# Patient Record
Sex: Female | Born: 1975 | Race: Black or African American | Hispanic: No | Marital: Married | Smoking: Never smoker
Health system: Southern US, Community
[De-identification: ages and names within clinical notes are randomized; demographics above are authoritative.]

## PROBLEM LIST (undated history)

## (undated) ENCOUNTER — Emergency Department (HOSPITAL_BASED_OUTPATIENT_CLINIC_OR_DEPARTMENT_OTHER): Admission: EM | Discharge status: Absent without leave | Payer: Self-pay

## (undated) DIAGNOSIS — T7840XA Allergy, unspecified, initial encounter: Secondary | ICD-10-CM

## (undated) DIAGNOSIS — R87629 Unspecified abnormal cytological findings in specimens from vagina: Secondary | ICD-10-CM

## (undated) DIAGNOSIS — E785 Hyperlipidemia, unspecified: Secondary | ICD-10-CM

## (undated) DIAGNOSIS — I10 Essential (primary) hypertension: Secondary | ICD-10-CM

## (undated) HISTORY — DX: Hyperlipidemia, unspecified: E78.5

## (undated) HISTORY — PX: TUBAL LIGATION: SHX77

## (undated) HISTORY — DX: Allergy, unspecified, initial encounter: T78.40XA

## (undated) HISTORY — DX: Essential (primary) hypertension: I10

## (undated) HISTORY — PX: ABDOMINAL HYSTERECTOMY: SHX81

## (undated) HISTORY — DX: Unspecified abnormal cytological findings in specimens from vagina: R87.629

---

## 2010-06-12 ENCOUNTER — Emergency Department (HOSPITAL_BASED_OUTPATIENT_CLINIC_OR_DEPARTMENT_OTHER): Admission: EM | Admit: 2010-06-12 | Discharge: 2010-06-12 | Payer: Self-pay | Admitting: Emergency Medicine

## 2011-02-05 LAB — URINALYSIS, ROUTINE W REFLEX MICROSCOPIC
Bilirubin Urine: NEGATIVE
Ketones, ur: NEGATIVE mg/dL
Nitrite: NEGATIVE
Protein, ur: NEGATIVE mg/dL
Urobilinogen, UA: 0.2 mg/dL (ref 0.0–1.0)
pH: 5.5 (ref 5.0–8.0)

## 2011-02-05 LAB — PREGNANCY, URINE: Preg Test, Ur: NEGATIVE

## 2015-05-14 IMAGING — US US ABDOMEN LIMITED
1 series · 14 of 25 positions shown · non-contrast
Comparison: None.

CLINICAL DATA: Right upper quadrant pain radiating to the back for
2 days. Nausea.

EXAM:
US ABDOMEN LIMITED - RIGHT UPPER QUADRANT

[Series 1: us abdomen limited · 0.20mm/px · 14 of 44 slices shown]
[im 1/44]
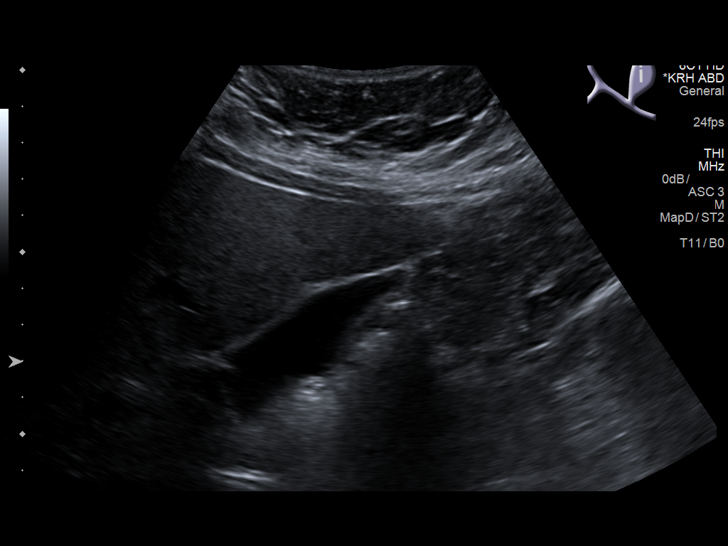
[im 4/44]
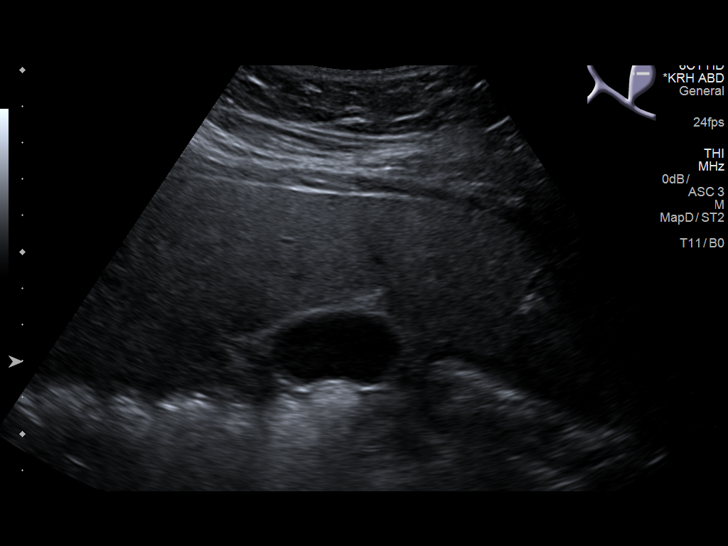
[im 8/44]
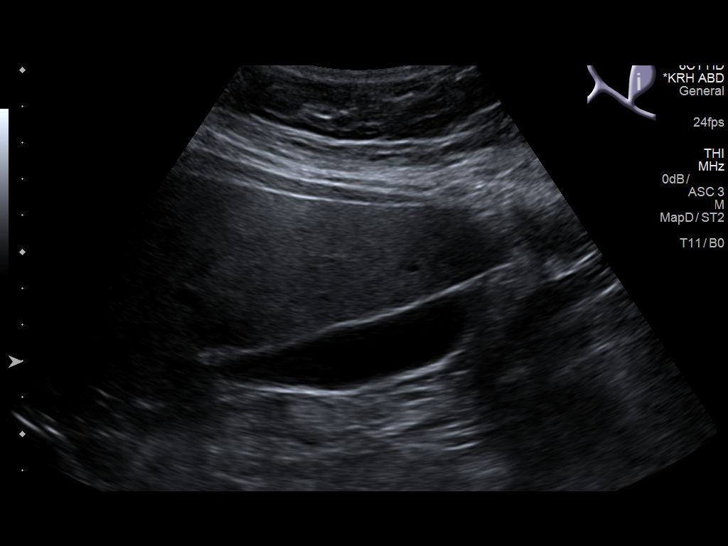
[im 11/44]
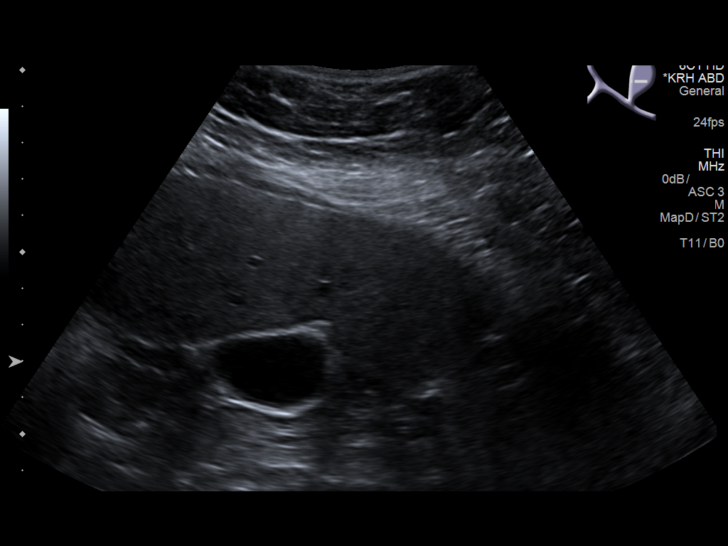
[im 15/44]
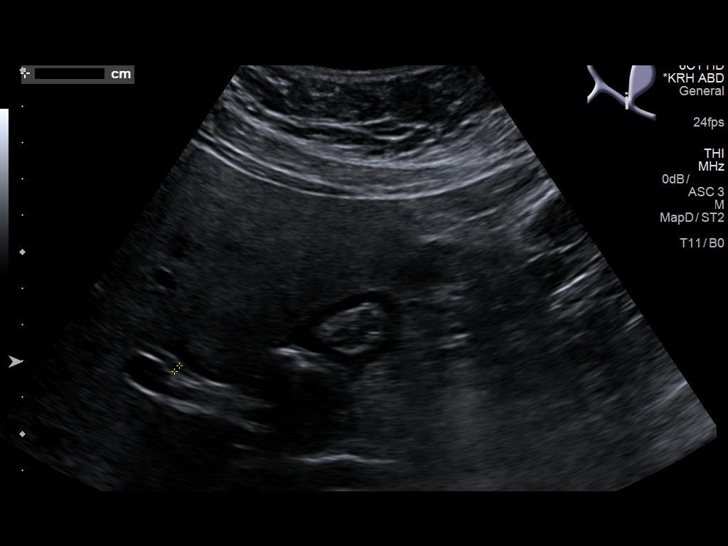
[im 17/44]
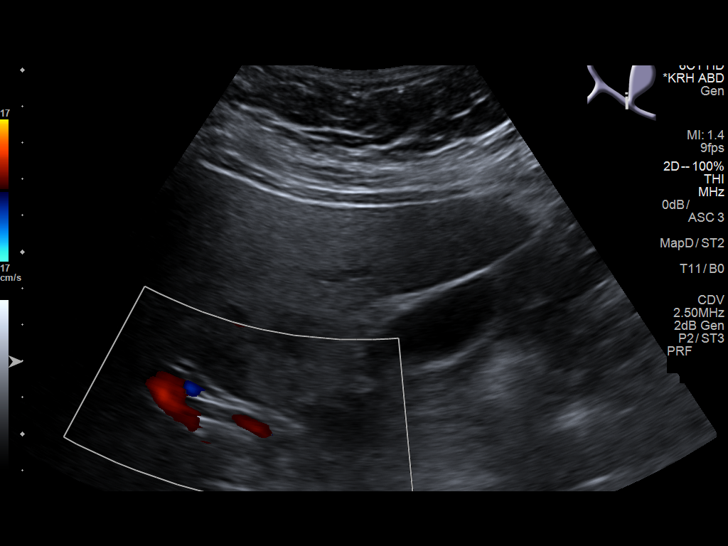
[im 20/44]
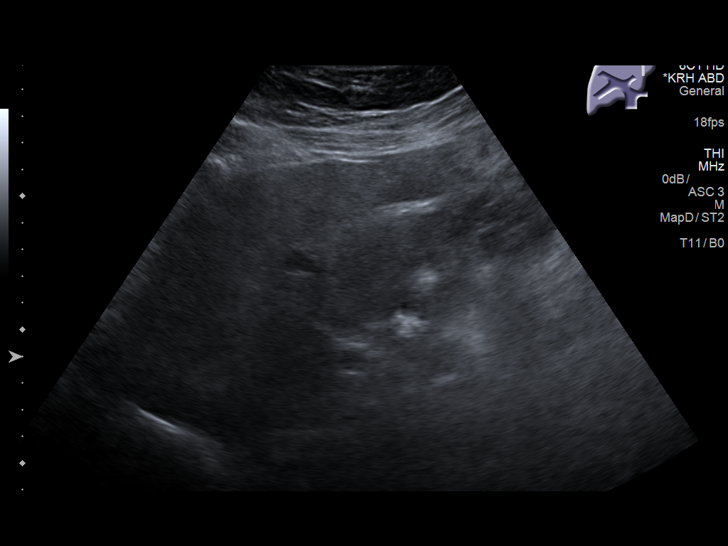
[im 24/44]
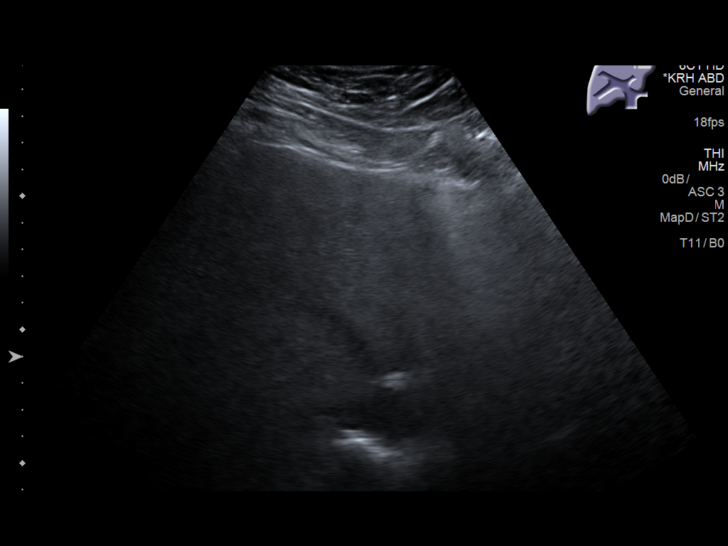
[im 27/44]
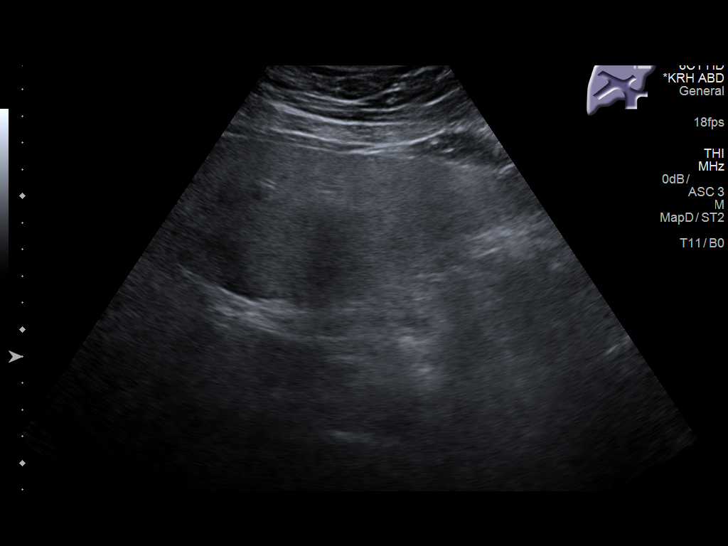
[im 29/44]
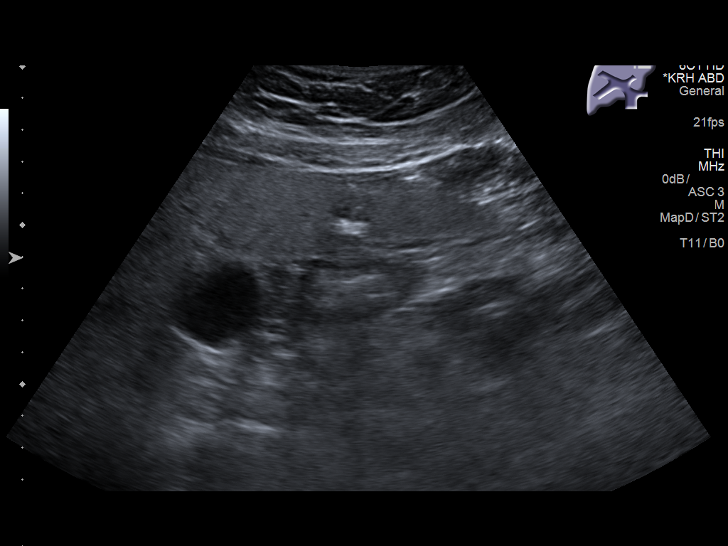
[im 33/44]
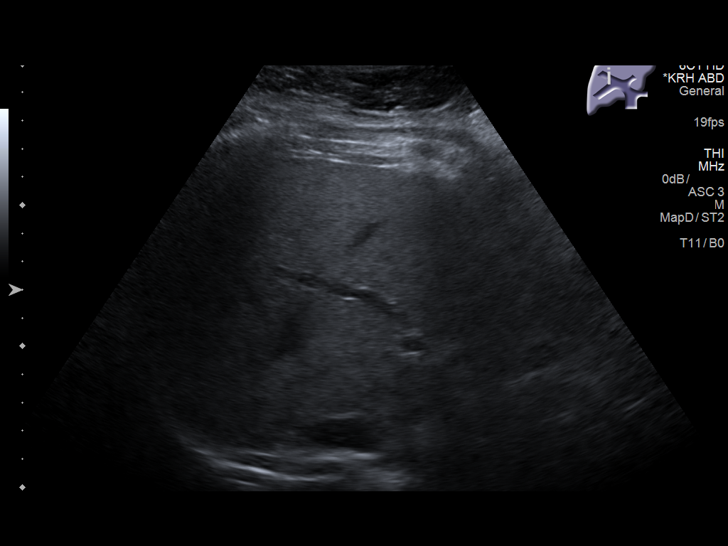
[im 36/44]
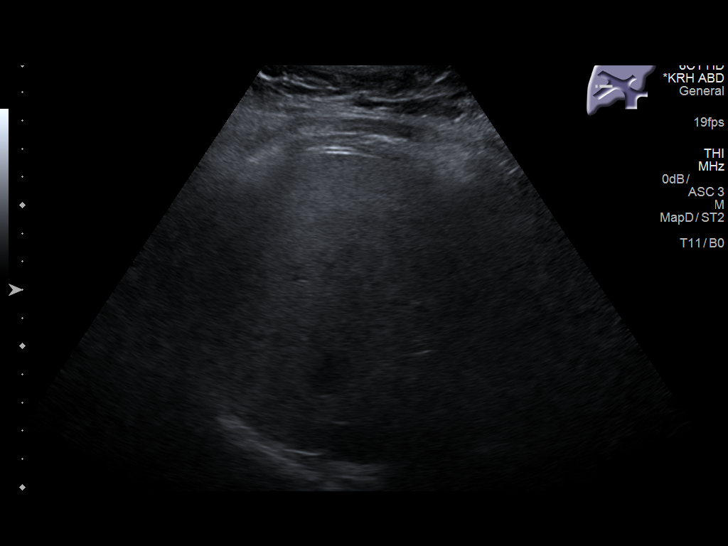
[im 40/44]
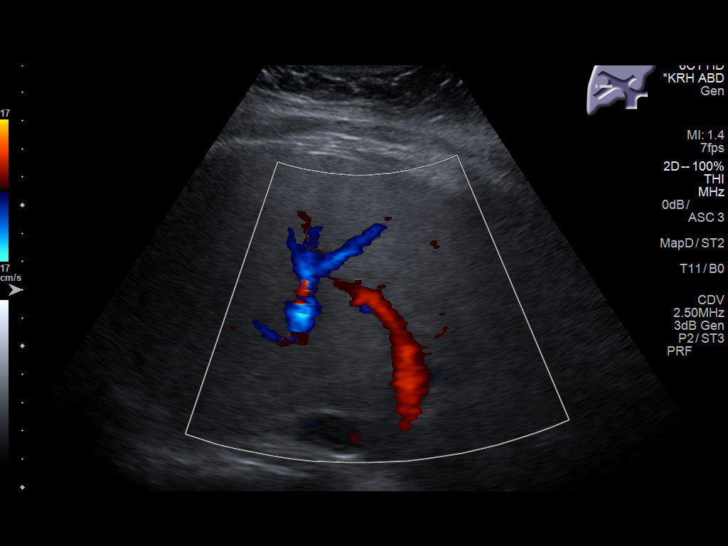
[im 44/44]
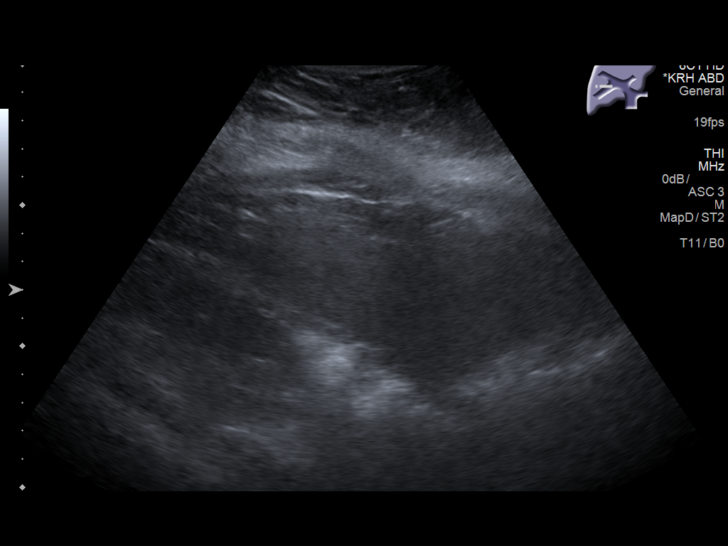

[14 of 25 positions shown; findings below may reference images not displayed]

FINDINGS: Gallbladder:

No gallstones or wall thickening visualized. No sonographic Murphy
sign noted by sonographer.

Common bile duct:

Diameter: 2.4 mm

Liver:

Diffuse echogenicity of hepatic parenchyma, likely fatty
infiltration. No focal liver lesion.
IMPRESSION: Normal gallbladder and bile ducts. Echogenic hepatic parenchyma may
represent fatty infiltration.

## 2015-10-29 ENCOUNTER — Encounter (INDEPENDENT_AMBULATORY_CARE_PROVIDER_SITE_OTHER): Payer: Self-pay

## 2015-10-29 ENCOUNTER — Encounter: Payer: Self-pay | Admitting: Medical

## 2015-10-29 ENCOUNTER — Ambulatory Visit (INDEPENDENT_AMBULATORY_CARE_PROVIDER_SITE_OTHER): Payer: BLUE CROSS/BLUE SHIELD | Admitting: Medical

## 2015-10-29 ENCOUNTER — Ambulatory Visit (HOSPITAL_BASED_OUTPATIENT_CLINIC_OR_DEPARTMENT_OTHER)
Admission: RE | Admit: 2015-10-29 | Discharge: 2015-10-29 | Disposition: A | Discharge status: Home / Self Care | Payer: BLUE CROSS/BLUE SHIELD | Source: Ambulatory Visit | Attending: Medical | Admitting: Medical

## 2015-10-29 VITALS — BP 120/80 | HR 88 | Temp 98.0°F | Ht 62.0 in | Wt 233.0 lb

## 2015-10-29 DIAGNOSIS — M545 Low back pain: Secondary | ICD-10-CM

## 2015-10-29 DIAGNOSIS — M25512 Pain in left shoulder: Secondary | ICD-10-CM

## 2015-10-29 DIAGNOSIS — M25812 Other specified joint disorders, left shoulder: Secondary | ICD-10-CM | POA: Insufficient documentation

## 2015-10-29 DIAGNOSIS — R8290 Unspecified abnormal findings in urine: Secondary | ICD-10-CM

## 2015-10-29 DIAGNOSIS — I10 Essential (primary) hypertension: Secondary | ICD-10-CM

## 2015-10-29 DIAGNOSIS — M25519 Pain in unspecified shoulder: Secondary | ICD-10-CM | POA: Insufficient documentation

## 2015-10-29 DIAGNOSIS — G8929 Other chronic pain: Secondary | ICD-10-CM | POA: Insufficient documentation

## 2015-10-29 DIAGNOSIS — M542 Cervicalgia: Secondary | ICD-10-CM | POA: Diagnosis not present

## 2015-10-29 DIAGNOSIS — J309 Allergic rhinitis, unspecified: Secondary | ICD-10-CM | POA: Insufficient documentation

## 2015-10-29 DIAGNOSIS — J3089 Other allergic rhinitis: Secondary | ICD-10-CM | POA: Diagnosis not present

## 2015-10-29 HISTORY — DX: Essential (primary) hypertension: I10

## 2015-10-29 LAB — CBC WITH DIFFERENTIAL/PLATELET
BASOS ABS: 0 10*3/uL (ref 0.0–0.1)
Basophils Relative: 0.6 % (ref 0.0–3.0)
EOS PCT: 6.4 % — AB (ref 0.0–5.0)
Eosinophils Absolute: 0.4 10*3/uL (ref 0.0–0.7)
HEMATOCRIT: 40.8 % (ref 36.0–46.0)
Hemoglobin: 13.8 g/dL (ref 12.0–15.0)
LYMPHS PCT: 45.9 % (ref 12.0–46.0)
Lymphs Abs: 2.9 10*3/uL (ref 0.7–4.0)
MCHC: 33.8 g/dL (ref 30.0–36.0)
MCV: 89.6 fl (ref 78.0–100.0)
MONOS PCT: 7.2 % (ref 3.0–12.0)
Monocytes Absolute: 0.5 10*3/uL (ref 0.1–1.0)
Neutro Abs: 2.6 10*3/uL (ref 1.4–7.7)
Neutrophils Relative %: 39.9 % — ABNORMAL LOW (ref 43.0–77.0)
PLATELETS: 291 10*3/uL (ref 150.0–400.0)
RBC: 4.56 Mil/uL (ref 3.87–5.11)
RDW: 13.3 % (ref 11.5–15.5)
WBC: 6.4 10*3/uL (ref 4.0–10.5)

## 2015-10-29 LAB — POCT URINALYSIS DIPSTICK
Bilirubin, UA: NEGATIVE
Glucose, UA: NEGATIVE
Ketones, UA: NEGATIVE
NITRITE UA: 2
PH UA: 6
RBC UA: NEGATIVE
Spec Grav, UA: >=1.03
UROBILINOGEN UA: 0.2

## 2015-10-29 LAB — COMPREHENSIVE METABOLIC PANEL
ALK PHOS: 87 U/L (ref 39–117)
ALT: 36 U/L — AB (ref 0–35)
AST: 22 U/L (ref 0–37)
Albumin: 4.3 g/dL (ref 3.5–5.2)
BILIRUBIN TOTAL: 0.3 mg/dL (ref 0.2–1.2)
BUN: 11 mg/dL (ref 6–23)
CALCIUM: 9.9 mg/dL (ref 8.4–10.5)
CO2: 31 meq/L (ref 19–32)
Chloride: 101 mEq/L (ref 96–112)
Creatinine, Ser: 0.65 mg/dL (ref 0.40–1.20)
GFR: 128.64 mL/min (ref >60.00)
Glucose, Bld: 86 mg/dL (ref 70–99)
Potassium: 3.6 mEq/L (ref 3.5–5.1)
Sodium: 140 mEq/L (ref 135–145)
TOTAL PROTEIN: 7.9 g/dL (ref 6.0–8.3)

## 2015-10-29 IMAGING — DX DG SHOULDER 2+V*L*
3 series · 3 of 3 positions shown · non-contrast
Comparison: Chest x-ray of [DATE] which included portions
of the left shoulder.

CLINICAL DATA: Chronic left shoulder pain with no known injury

EXAM:
LEFT SHOULDER - 2+ VIEW

[shoulder grashey]
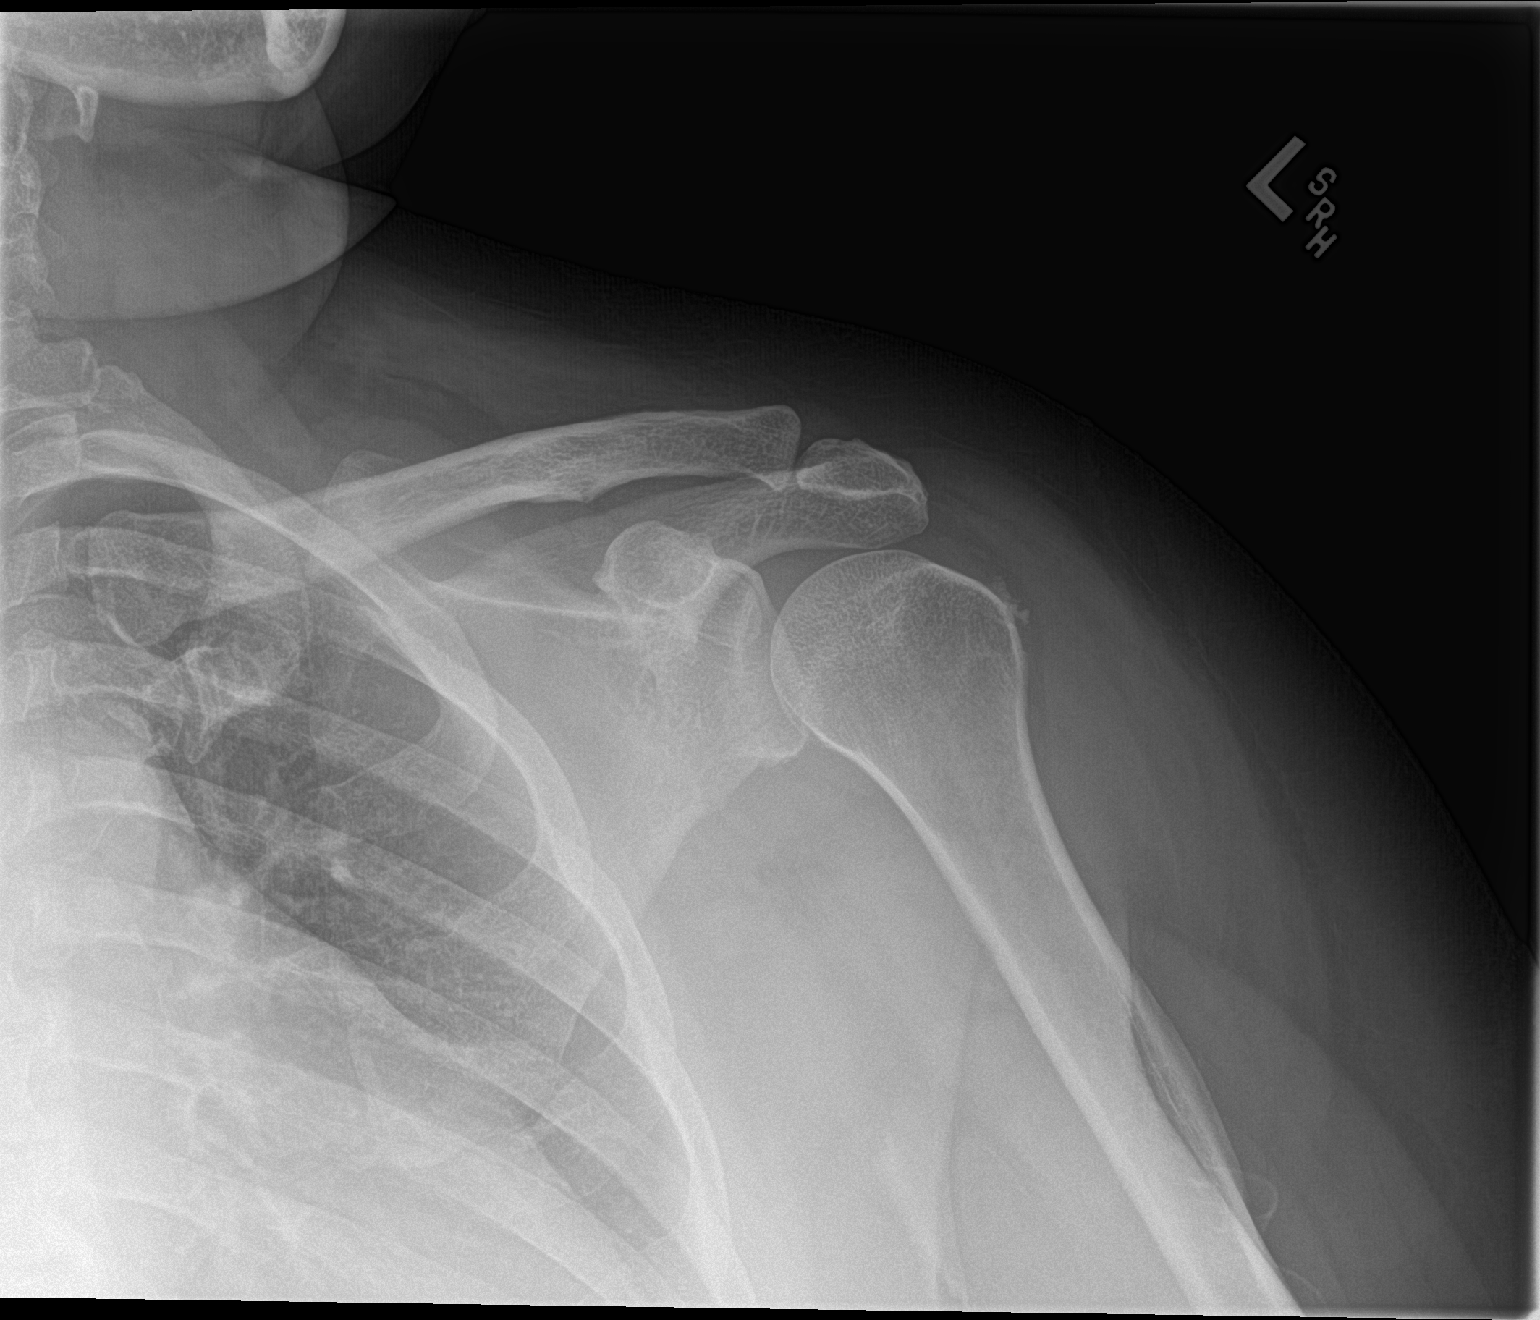

[shoulder y view]
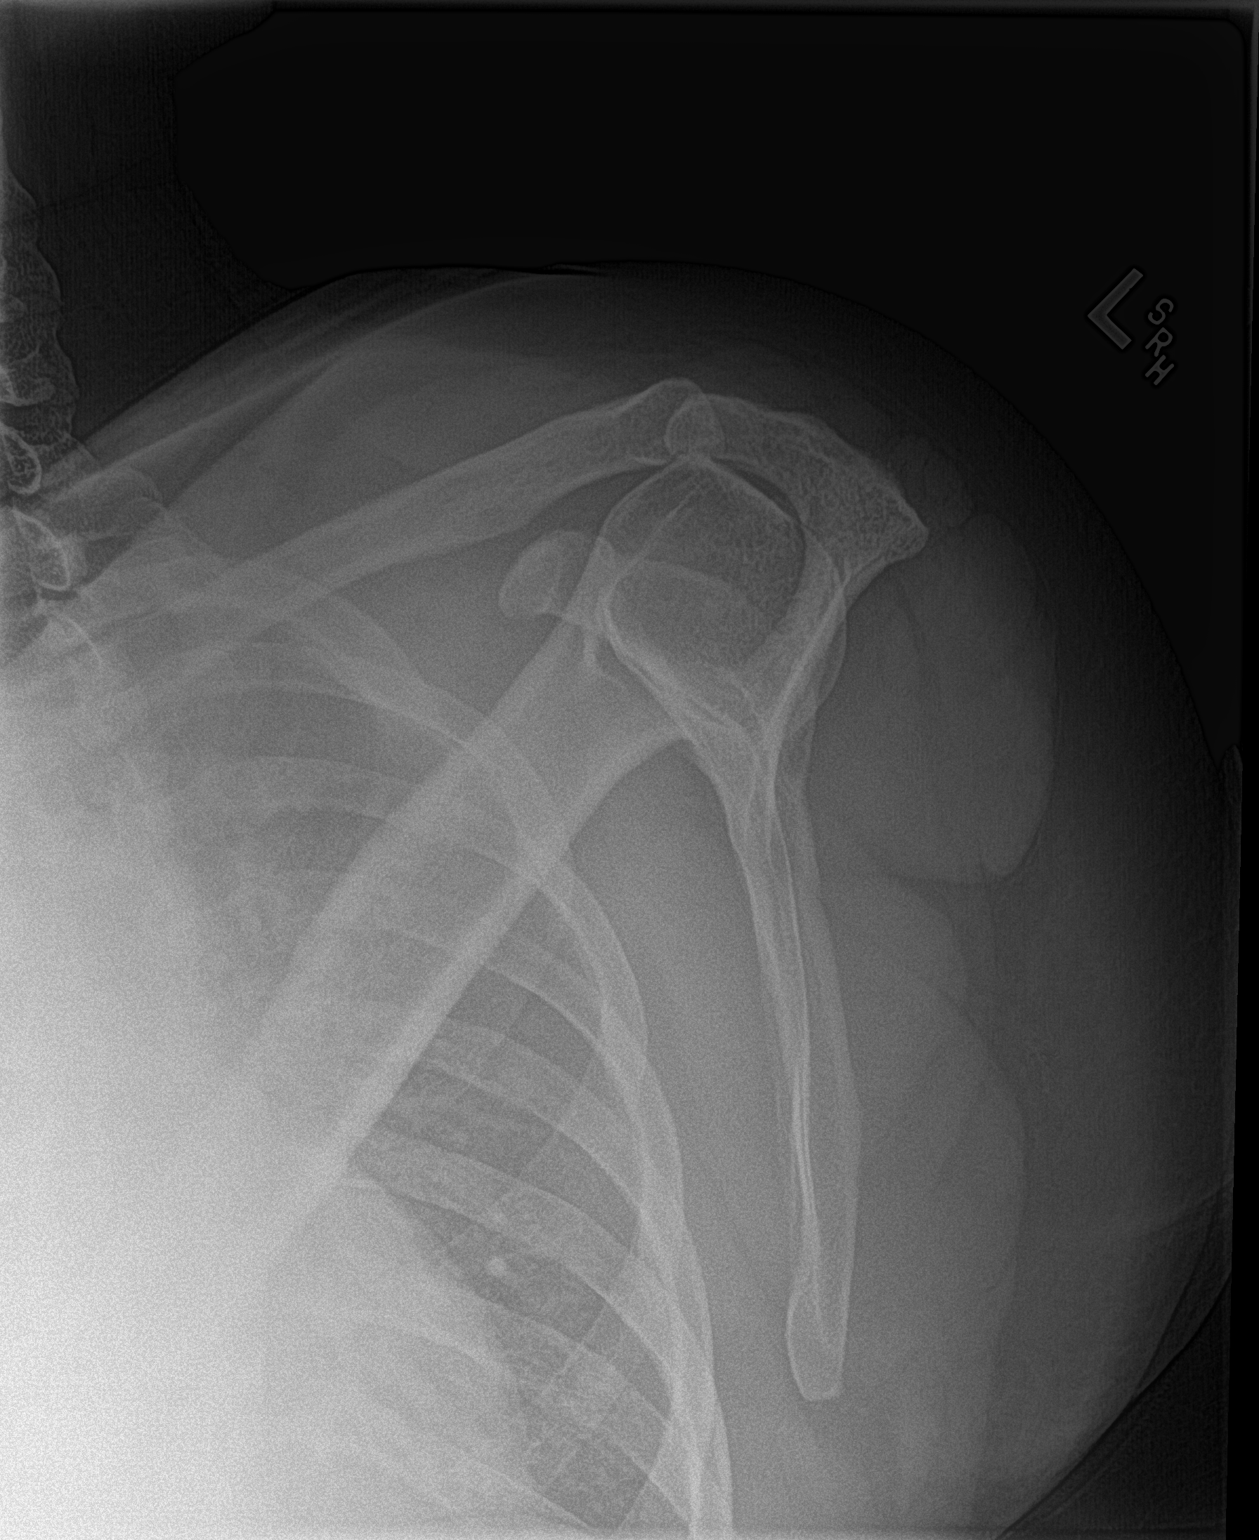

[shoulder axillary]
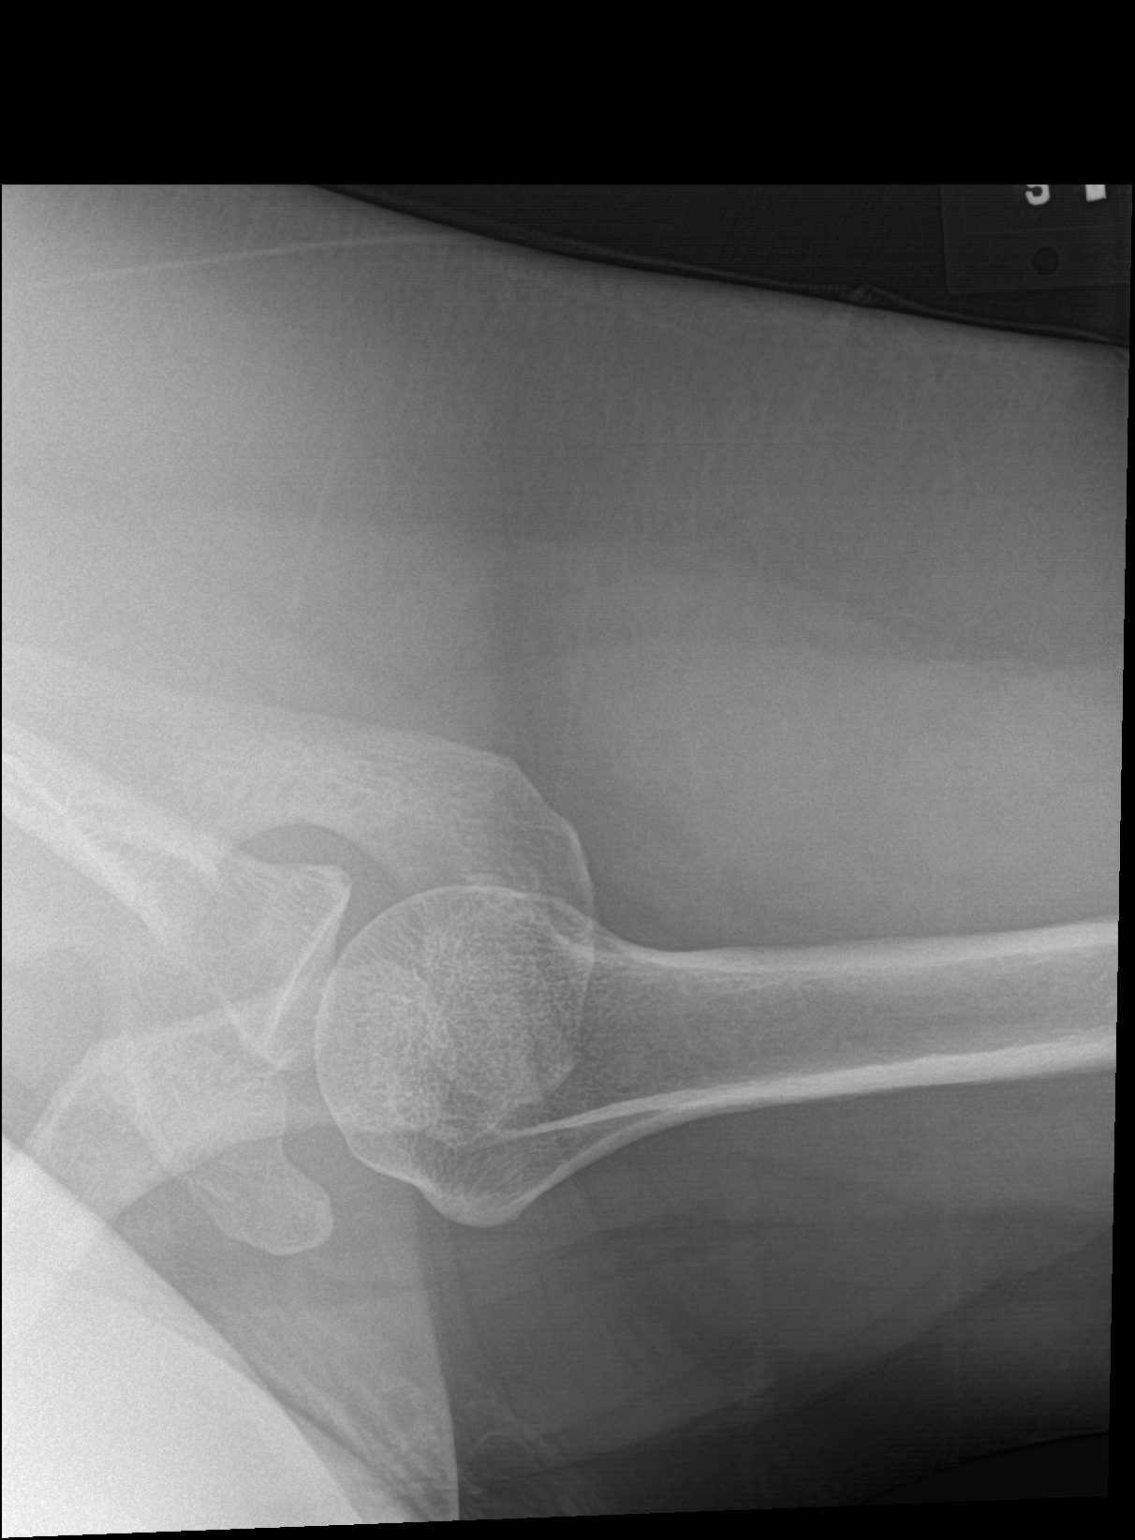

[3 of 3 positions shown; findings below may reference images not displayed]

FINDINGS: The bones are adequately mineralized. There is a small amount of
calcification in the region of the insertion of the rotator cuff on
the greater tuberosity of the humerus. The glenohumeral and AC joint
spaces are preserved. The subacromial subdeltoid space appears
normal. The observed portions of the left clavicle and upper left
ribs are normal.
IMPRESSION: Calcification in the region of the insertion of the rotator cuff on
the greater tuberosity of the humerus. Otherwise no significant
abnormality of the shoulder. If rotator cuff or glenoid labrum
abnormality is suspected, MRI would be a useful next imaging step.

## 2015-10-29 MED ORDER — FLUTICASONE PROPIONATE 50 MCG/ACT NA SUSP: 2.0000 | Freq: Every day | NASAL | Status: DC

## 2015-10-29 MED ORDER — MONTELUKAST SODIUM 10 MG PO TABS: 10.0000 mg | ORAL_TABLET | Freq: Every day | ORAL | Status: DC

## 2015-10-29 MED ORDER — LORATADINE 10 MG PO TABS: 10.0000 mg | ORAL_TABLET | Freq: Every day | ORAL | Status: DC

## 2015-10-29 MED ORDER — HYDROCHLOROTHIAZIDE 25 MG PO TABS: 25.0000 mg | ORAL_TABLET | Freq: Every day | ORAL | Status: DC

## 2015-10-29 MED ORDER — CIPROFLOXACIN HCL 500 MG PO TABS: 500.0000 mg | ORAL_TABLET | Freq: Two times a day (BID) | ORAL | Status: DC

## 2015-10-29 MED ORDER — CYCLOBENZAPRINE HCL 10 MG PO TABS: 10.0000 mg | ORAL_TABLET | Freq: Every day | ORAL | Status: DC

## 2015-10-29 NOTE — Assessment & Plan Note (Signed)
Year round. Will rx claritin, flonase and montelukast.

## 2015-10-29 NOTE — Assessment & Plan Note (Signed)
Minimal posterioir aspect on palpation today. But hx of. Will get xray of left shoulder. Ibuprofen low dose. May refer to sports med, ortho or PT. Will get xray results first.
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
## 2015-10-29 NOTE — Progress Notes (Signed)
Pre visit review using our clinic review tool, if applicable. No additional management support is needed unless otherwise documented below in the visit note. 

## 2015-10-29 NOTE — Assessment & Plan Note (Signed)
[facility] Told negative. But may need to repeat in future. Will rx low dose flexeril today and advise low dose ibuprofen.

## 2015-10-29 NOTE — Patient Instructions (Addendum)
HTN (hypertension) Will get cbc, cmp today. Refill your hctz.  Allergic rhinitis Year round. Will rx claritin, flonase and montelukast.  Pain in joint, shoulder region Minimal posterioir aspect on palpation today. But hx of. Will get xray of left shoulder. Ibuprofen low dose. May refer to sports med, ortho or PT. Will get xray results first.  [facility] Told [facility]. But may need to repeat in future. Will rx low dose flexeril today and advise low dose ibuprofen.    For frequent urination. Will get ua and culture. Rx 3 days of ciprofloxin antibiotic.  Follow up in 10 days or as needed

## 2015-10-29 NOTE — Assessment & Plan Note (Signed)
Will get cbc, cmp today. Refill your hctz.

## 2015-10-31 LAB — URINE CULTURE: Colony Count: >=100000

## 2015-11-10 ENCOUNTER — Other Ambulatory Visit: Payer: Self-pay | Admitting: Medical

## 2015-11-10 ENCOUNTER — Encounter: Payer: Self-pay | Admitting: Medical

## 2015-11-11 MED ORDER — CYCLOBENZAPRINE HCL 10 MG PO TABS: 10.0000 mg | ORAL_TABLET | Freq: Every day | ORAL | Status: DC

## 2015-11-11 MED ORDER — CIPROFLOXACIN HCL 500 MG PO TABS: 500.0000 mg | ORAL_TABLET | Freq: Two times a day (BID) | ORAL | Status: DC

## 2015-11-11 MED ORDER — HYDROCHLOROTHIAZIDE 25 MG PO TABS: 25.0000 mg | ORAL_TABLET | Freq: Every day | ORAL | Status: DC

## 2015-11-11 NOTE — Telephone Encounter (Signed)
Dana Patterson please advise on refill.  Pt states she is still having urinary symptoms.

## 2015-11-11 NOTE — Telephone Encounter (Signed)
Pt is coming in to give ua sample and i want culture of the urine. Will rx cipro again since she thinks residual infection. Also will refil her flexeril. Her bp today was 120/80 at home. Refill hctz. 90 tabs. Pt has occasional mild ha in am. She mentioned at end of the call. I advised any neuro signs or symptoms with ha or severe ha  then ED. She declined appointment this week. She will schedule for next wed one week from today.

## 2015-11-12 ENCOUNTER — Other Ambulatory Visit (INDEPENDENT_AMBULATORY_CARE_PROVIDER_SITE_OTHER): Payer: BLUE CROSS/BLUE SHIELD

## 2015-11-12 DIAGNOSIS — R319 Hematuria, unspecified: Secondary | ICD-10-CM

## 2015-11-12 DIAGNOSIS — R350 Frequency of micturition: Secondary | ICD-10-CM

## 2015-11-12 LAB — POCT URINALYSIS DIPSTICK
Bilirubin, UA: NEGATIVE
Blood, UA: NEGATIVE
GLUCOSE UA: NEGATIVE
Ketones, UA: NEGATIVE
Leukocytes, UA: NEGATIVE
NITRITE UA: NEGATIVE
Protein, UA: NEGATIVE
Spec Grav, UA: >=1.03
UROBILINOGEN UA: 0.2
pH, UA: 6

## 2015-11-12 NOTE — Telephone Encounter (Signed)
Sent in cipro and flexeril last night then got another request. Will you check on this?

## 2015-11-12 NOTE — Telephone Encounter (Signed)
Let me know if was not sent?

## 2015-11-13 LAB — URINE CULTURE

## 2015-11-17 ENCOUNTER — Ambulatory Visit (INDEPENDENT_AMBULATORY_CARE_PROVIDER_SITE_OTHER): Payer: BLUE CROSS/BLUE SHIELD | Admitting: Family Medicine

## 2015-11-17 ENCOUNTER — Encounter: Payer: Self-pay | Admitting: Family Medicine

## 2015-11-17 VITALS — BP 122/76 | HR 105 | Temp 98.5°F | Ht 62.0 in | Wt 237.6 lb

## 2015-11-17 DIAGNOSIS — I10 Essential (primary) hypertension: Secondary | ICD-10-CM | POA: Diagnosis not present

## 2015-11-17 DIAGNOSIS — R000 Tachycardia, unspecified: Secondary | ICD-10-CM | POA: Diagnosis not present

## 2015-11-17 LAB — COMPREHENSIVE METABOLIC PANEL
ALBUMIN: 4.1 g/dL (ref 3.5–5.2)
ALK PHOS: 82 U/L (ref 39–117)
ALT: 29 U/L (ref 0–35)
AST: 23 U/L (ref 0–37)
BILIRUBIN TOTAL: 0.3 mg/dL (ref 0.2–1.2)
BUN: 10 mg/dL (ref 6–23)
CALCIUM: 9.7 mg/dL (ref 8.4–10.5)
CHLORIDE: 102 meq/L (ref 96–112)
CO2: 29 mEq/L (ref 19–32)
CREATININE: 0.64 mg/dL (ref 0.40–1.20)
GFR: 130.93 mL/min (ref >60.00)
Glucose, Bld: 119 mg/dL — ABNORMAL HIGH (ref 70–99)
Potassium: 3.3 mEq/L — ABNORMAL LOW (ref 3.5–5.1)
Sodium: 138 mEq/L (ref 135–145)
TOTAL PROTEIN: 7.3 g/dL (ref 6.0–8.3)

## 2015-11-17 LAB — CBC WITH DIFFERENTIAL/PLATELET
Basophils Absolute: 0 10*3/uL (ref 0.0–0.1)
Basophils Relative: 0.5 % (ref 0.0–3.0)
EOS PCT: 7 % — AB (ref 0.0–5.0)
Eosinophils Absolute: 0.4 10*3/uL (ref 0.0–0.7)
HCT: 39.3 % (ref 36.0–46.0)
HEMOGLOBIN: 13.2 g/dL (ref 12.0–15.0)
LYMPHS PCT: 38.3 % (ref 12.0–46.0)
Lymphs Abs: 2.3 10*3/uL (ref 0.7–4.0)
MCHC: 33.6 g/dL (ref 30.0–36.0)
MCV: 89.7 fl (ref 78.0–100.0)
MONOS PCT: 6.2 % (ref 3.0–12.0)
Monocytes Absolute: 0.4 10*3/uL (ref 0.1–1.0)
Neutro Abs: 2.9 10*3/uL (ref 1.4–7.7)
Neutrophils Relative %: 48 % (ref 43.0–77.0)
Platelets: 232 10*3/uL (ref 150.0–400.0)
RBC: 4.38 Mil/uL (ref 3.87–5.11)
RDW: 13.3 % (ref 11.5–15.5)
WBC: 6 10*3/uL (ref 4.0–10.5)

## 2015-11-17 NOTE — Patient Instructions (Signed)
Nonspecific Tachycardia Tachycardia is a faster than normal heartbeat (more than 100 beats per minute). In adults, the heart normally beats between 60 and 100 times a minute. A fast heartbeat may be a normal response to exercise or stress. It does not necessarily mean that something is wrong. However, sometimes when your heart beats too fast it may not be able to pump enough blood to the rest of your body. This can result in chest pain, shortness of breath, dizziness, and even fainting. Nonspecific tachycardia means that the specific cause or pattern of your tachycardia is unknown. CAUSES  Tachycardia may be harmless or it may be due to a more serious underlying cause. Possible causes of tachycardia include:  Exercise or exertion.  Fever.  Pain or injury.  Infection.  Loss of body fluids (dehydration).  Overactive thyroid.  Lack of red blood cells (anemia).  Anxiety and stress.  Alcohol.  Caffeine.  Tobacco products.  Diet pills.  Illegal drugs.  Heart disease. SYMPTOMS  Rapid or irregular heartbeat (palpitations).  Suddenly feeling your heart beating (cardiac awareness).  Dizziness.  Tiredness (fatigue).  Shortness of breath.  Chest pain.  Nausea.  Fainting. DIAGNOSIS  Your caregiver will perform a physical exam and take your medical history. In some cases, a heart specialist (cardiologist) may be consulted. Your caregiver may also order:  Blood tests.  Electrocardiography. This test records the electrical activity of your heart.  A heart monitoring test. TREATMENT  Treatment will depend on the likely cause of your tachycardia. The goal is to treat the underlying cause of your tachycardia. Treatment methods may include:  Replacement of fluids or blood through an intravenous (IV) tube for moderate to severe dehydration or anemia.  New medicines or changes in your current medicines.  Diet and lifestyle changes.  Treatment for certain  infections.  Stress relief or relaxation methods. HOME CARE INSTRUCTIONS   Rest.  Drink enough fluids to keep your urine clear or pale yellow.  Do not smoke.  Avoid:  Caffeine.  Tobacco.  Alcohol.  Chocolate.  Stimulants such as over-the-counter diet pills or pills that help you stay awake.  Situations that cause anxiety or stress.  Illegal drugs such as marijuana, phencyclidine (PCP), and cocaine.  Only take medicine as directed by your caregiver.  Keep all follow-up appointments as directed by your caregiver. SEEK IMMEDIATE MEDICAL CARE IF:   You have pain in your chest, upper arms, jaw, or neck.  You become weak, dizzy, or feel faint.  You have palpitations that will not go away.  You vomit, have diarrhea, or pass blood in your stool.  Your skin is cool, pale, and wet.  You have a fever that will not go away with rest, fluids, and medicine. MAKE SURE YOU:   Understand these instructions.  Will watch your condition.  Will get help right away if you are not doing well or get worse.   This information is not intended to replace advice given to you by your health care provider. Make sure you discuss any questions you have with your health care provider.   Document Released: 12/15/2004 Document Revised: 01/30/2012 Document Reviewed: 05/22/2015 Elsevier Interactive Patient Education 2016 [employer]  

## 2015-11-17 NOTE — Assessment & Plan Note (Signed)
Stable  con't hctz  

## 2015-11-17 NOTE — Progress Notes (Signed)
Patient ID: Dana Patterson, female    DOB: 1976/01/28  Age: 39 y.o. MRN: [id number]    Subjective:  Subjective HPI Belma Dyches presents for f/u bp --- no complaints.    Review of Systems  Constitutional: Negative for diaphoresis, appetite change, fatigue and unexpected weight change.  Eyes: Negative for pain, redness and visual disturbance.  Respiratory: Negative for cough, chest tightness, shortness of breath and wheezing.   Cardiovascular: Negative for chest pain, palpitations and leg swelling.  Endocrine: Negative for cold intolerance, heat intolerance, polydipsia, polyphagia and polyuria.  Genitourinary: Negative for dysuria, frequency and difficulty urinating.  Neurological: Negative for dizziness, light-headedness, numbness and headaches.  All other systems reviewed and are negative.   History Past Medical History  Diagnosis Date  . Allergy   . Hyperlipidemia   . HTN (hypertension) 10/29/2015    She has past surgical history that includes Abdominal hysterectomy.   Her family history includes Heart disease in her mother; Hypertension in her father and mother; Kidney disease in her mother.She reports that she has never smoked. She has never used smokeless tobacco. She reports that she does not drink alcohol or use illicit drugs.  Current Outpatient Prescriptions on File Prior to Visit  Medication Sig Dispense Refill  . ciprofloxacin (CIPRO) 500 MG tablet Take 1 tablet (500 mg total) by mouth 2 (two) times daily. 14 tablet 0  . cyclobenzaprine (FLEXERIL) 10 MG tablet Take 1 tablet (10 mg total) by mouth at bedtime. 10 tablet 0  . fluticasone (FLONASE) 50 MCG/ACT nasal spray Place 2 sprays into both nostrils daily. 16 g 11  . hydrochlorothiazide (HYDRODIURIL) 25 MG tablet Take 1 tablet (25 mg total) by mouth daily. 90 tablet 0  . loratadine (CLARITIN) 10 MG tablet Take 1 tablet (10 mg total) by mouth daily. 30 tablet 11    . montelukast (SINGULAIR) 10 MG tablet Take 1 tablet (10 mg total) by mouth at bedtime. 30 tablet 11   No current facility-administered medications on file prior to visit.     Objective:  Objective Physical Exam  Constitutional: She is oriented to person, place, and time. She appears well-developed and well-nourished.  HENT:  Head: Normocephalic and atraumatic.  Eyes: Conjunctivae and EOM are normal.  Neck: Normal range of motion. Neck supple. No JVD present. Carotid bruit is not present. No thyromegaly present.  Cardiovascular: Normal rate, regular rhythm and normal heart sounds.   No murmur heard. Pulmonary/Chest: Effort normal and breath sounds normal. No respiratory distress. She has no wheezes. She has no rales. She exhibits no tenderness.  Musculoskeletal: She exhibits no edema.  Neurological: She is alert and oriented to person, place, and time.  Psychiatric: She has a normal mood and affect.  Nursing note and vitals reviewed.  BP 122/76 mmHg  Pulse 105  Temp(Src) 98.5 F (36.9 C) (Oral)  Ht '5\' 2"'$  (1.575 m)  Wt 237 lb 9.6 oz (107.775 kg)  BMI 43.45 kg/m2  SpO2 98% Wt Readings from Last 3 Encounters:  11/17/15 237 lb 9.6 oz (107.775 kg)  10/29/15 233 lb (105.688 kg)     Lab Results  Component Value Date   WBC 6.4 10/29/2015   HGB 13.8 10/29/2015   HCT 40.8 10/29/2015   PLT 291.0 10/29/2015   GLUCOSE 86 10/29/2015   ALT 36* 10/29/2015   AST 22 10/29/2015  NA 140 10/29/2015   K 3.6 10/29/2015   CL 101 10/29/2015   CREATININE 0.65 10/29/2015   BUN 11 10/29/2015   CO2 31 10/29/2015    Dg Shoulder Left  10/29/2015  CLINICAL DATA:  Chronic left shoulder pain with no known injury EXAM: LEFT SHOULDER - 2+ VIEW COMPARISON:  Chest x-ray of January 30, 2015 which included portions of the left shoulder. FINDINGS: The bones are adequately mineralized. There is a small amount of calcification in the region of the insertion of the rotator cuff on the greater tuberosity of  the humerus. The glenohumeral and AC joint spaces are preserved. The subacromial subdeltoid space appears normal. The observed portions of the left clavicle and upper left ribs are normal. IMPRESSION: Calcification in the region of the insertion of the rotator cuff on the greater tuberosity of the humerus. Otherwise no significant abnormality of the shoulder. If rotator cuff or glenoid labrum abnormality is suspected, MRI would be a useful next imaging step. Electronically Signed   By: David  Martinique M.D.   On: 10/29/2015 12:35     Assessment & Plan:  Plan I am having Ms. Huckeba maintain her loratadine, fluticasone, montelukast, cyclobenzaprine, ciprofloxacin, and hydrochlorothiazide.  No orders of the defined types were placed in this encounter.    Problem List Items Addressed This Visit      Unprioritized   HTN (hypertension)    Stable con't hctz       Other Visit Diagnoses    Tachycardia    -  Primary    Relevant Orders    Thyroid Panel With TSH    Comp Met (CMET)    CBC with Differential/Platelet       Follow-up: Return in about 3 weeks (around 12/08/2015), or if symptoms worsen or fail to improve, for hypertension.  Garnet Koyanagi, DO

## 2015-11-17 NOTE — Progress Notes (Signed)
Pre visit review using our clinic review tool, if applicable. No additional management support is needed unless otherwise documented below in the visit note. 

## 2015-11-18 LAB — THYROID PANEL WITH TSH
Free Thyroxine Index: 1.9 (ref 1.4–3.8)
T3 Uptake: 27 % (ref 22–35)
T4 TOTAL: 7.2 ug/dL (ref 4.5–12.0)
TSH: 1.2 u[IU]/mL (ref 0.350–4.500)

## 2015-11-24 ENCOUNTER — Other Ambulatory Visit: Payer: Self-pay

## 2015-11-24 DIAGNOSIS — E876 Hypokalemia: Secondary | ICD-10-CM

## 2015-11-24 MED ORDER — POTASSIUM CHLORIDE CRYS ER 20 MEQ PO TBCR: 20.0000 meq | EXTENDED_RELEASE_TABLET | Freq: Every day | ORAL | Status: DC

## 2015-12-07 ENCOUNTER — Ambulatory Visit: Payer: Self-pay | Admitting: Family

## 2015-12-08 ENCOUNTER — Encounter: Payer: Self-pay | Admitting: Family Medicine

## 2015-12-08 ENCOUNTER — Ambulatory Visit (INDEPENDENT_AMBULATORY_CARE_PROVIDER_SITE_OTHER): Payer: BLUE CROSS/BLUE SHIELD | Admitting: Family Medicine

## 2015-12-08 VITALS — BP 120/78 | HR 102 | Temp 98.4°F | Ht 62.0 in | Wt 234.0 lb

## 2015-12-08 DIAGNOSIS — M25552 Pain in left hip: Secondary | ICD-10-CM | POA: Diagnosis not present

## 2015-12-08 DIAGNOSIS — R002 Palpitations: Secondary | ICD-10-CM

## 2015-12-08 DIAGNOSIS — I10 Essential (primary) hypertension: Secondary | ICD-10-CM

## 2015-12-08 NOTE — Patient Instructions (Signed)

## 2015-12-08 NOTE — Progress Notes (Signed)
Patient ID: Dana Patterson, female    DOB: August 04, 1976  Age: 40 y.o. MRN: [id number]    Subjective:  Subjective HPI Dana Patterson presents for f/u bp and still c/o some palpitations.  Her L hip hurts some from dancing but it is slowly improving  Review of Systems  Constitutional: Negative for fever, chills, activity change and appetite change.  Gastrointestinal: Negative for abdominal pain and abdominal distention.  Genitourinary: Positive for dysuria, urgency and frequency. Negative for hematuria, flank pain, vaginal discharge, difficulty urinating, genital sores, vaginal pain, menstrual problem, pelvic pain and dyspareunia.  Musculoskeletal: Negative for back pain.    History Past Medical History  Diagnosis Date  . Allergy   . Hyperlipidemia   . HTN (hypertension) 10/29/2015    She has past surgical history that includes Abdominal hysterectomy.   Her family history includes Heart disease in her mother; Hypertension in her father and mother; Kidney disease in her mother.She reports that she has never smoked. She has never used smokeless tobacco. She reports that she does not drink alcohol or use illicit drugs.  Current Outpatient Prescriptions on File Prior to Visit  Medication Sig Dispense Refill  . cyclobenzaprine (FLEXERIL) 10 MG tablet Take 1 tablet (10 mg total) by mouth at bedtime. 10 tablet 0  . fluticasone (FLONASE) 50 MCG/ACT nasal spray Place 2 sprays into both nostrils daily. 16 g 11  . hydrochlorothiazide (HYDRODIURIL) 25 MG tablet Take 1 tablet (25 mg total) by mouth daily. 90 tablet 0  . loratadine (CLARITIN) 10 MG tablet Take 1 tablet (10 mg total) by mouth daily. 30 tablet 11  . montelukast (SINGULAIR) 10 MG tablet Take 1 tablet (10 mg total) by mouth at bedtime. 30 tablet 11  . potassium chloride SA (K-DUR,KLOR-CON) 20 MEQ tablet Take 1 tablet (20 mEq total) by mouth daily. 30 tablet 2   No current facility-administered medications on file prior to visit.       Objective:  Objective Physical Exam  Constitutional: She is oriented to person, place, and time. She appears well-developed and well-nourished.  HENT:  Head: Normocephalic and atraumatic.  Eyes: Conjunctivae and EOM are normal.  Neck: Normal range of motion. Neck supple. No JVD present. Carotid bruit is not present. No thyromegaly present.  Cardiovascular: Normal rate, regular rhythm and normal heart sounds.   No murmur heard. Pulmonary/Chest: Effort normal and breath sounds normal. No respiratory distress. She has no wheezes. She has no rales. She exhibits no tenderness.  Musculoskeletal: She exhibits no edema.  Neurological: She is alert and oriented to person, place, and time.  Psychiatric: She has a normal mood and affect. Her behavior is normal.  Nursing note and vitals reviewed.  BP 120/78 mmHg  Pulse 102  Temp(Src) 98.4 F (36.9 C) (Oral)  Ht  (1.575 m)  Wt 234 lb (106.142 kg)  BMI 42.79 kg/m2  SpO2 98% Wt Readings from Last 3 Encounters:  12/08/15 234 lb (106.142 kg)  11/17/15 237 lb 9.6 oz (107.775 kg)  10/29/15 233 lb (105.688 kg)     Lab Results  Component Value Date   WBC 6.0 11/17/2015   HGB 13.2 11/17/2015   HCT 39.3 11/17/2015   PLT 232.0 11/17/2015   GLUCOSE 119* 11/17/2015   ALT 29 11/17/2015   AST 23 11/17/2015   NA 138 11/17/2015   K 3.3* 11/17/2015   CL 102 11/17/2015   CREATININE 0.64 11/17/2015   BUN 10 11/17/2015   CO2 29 11/17/2015   TSH 1.200 11/17/2015  Dg Shoulder Left  10/29/2015  CLINICAL DATA:  Chronic left shoulder pain with no known injury EXAM: LEFT SHOULDER - 2+ VIEW COMPARISON:  Chest x-ray of January 30, 2015 which included portions of the left shoulder. FINDINGS: The bones are adequately mineralized. There is a small amount of calcification in the region of the insertion of the rotator cuff on the greater tuberosity of the humerus. The glenohumeral and AC joint spaces are preserved. The subacromial subdeltoid space appears  normal. The observed portions of the left clavicle and upper left ribs are normal. IMPRESSION: Calcification in the region of the insertion of the rotator cuff on the greater tuberosity of the humerus. Otherwise no significant abnormality of the shoulder. If rotator cuff or glenoid labrum abnormality is suspected, MRI would be a useful next imaging step. Electronically Signed   By: David  [place] M.D.   On: 10/29/2015 12:35     Assessment & Plan:  Plan I have discontinued Ms. Woodrick's ciprofloxacin. I am also having her maintain her loratadine, fluticasone, montelukast, cyclobenzaprine, hydrochlorothiazide, potassium chloride SA, and albuterol.  Meds ordered this encounter  Medications  . albuterol (PROVENTIL HFA;VENTOLIN HFA) 108 (90 Base) MCG/ACT inhaler    Sig: Inhale 2 puffs into the lungs every 4 (four) hours as needed.    Problem List Items Addressed This Visit      Unprioritized   Left hip pain    Hip pain improved F/u prn      HTN (hypertension)    Stable  Current outpatient prescriptions:  .  albuterol (PROVENTIL HFA;VENTOLIN HFA) 108 (90 Base) MCG/ACT inhaler, Inhale 2 puffs into the lungs every 4 (four) hours as needed., Disp: , Rfl:  .  cyclobenzaprine (FLEXERIL) 10 MG tablet, Take 1 tablet (10 mg total) by mouth at bedtime., Disp: 10 tablet, Rfl: 0 .  fluticasone (FLONASE) 50 MCG/ACT nasal spray, Place 2 sprays into both nostrils daily., Disp: 16 g, Rfl: 11 .  hydrochlorothiazide (HYDRODIURIL) 25 MG tablet, Take 1 tablet (25 mg total) by mouth daily., Disp: 90 tablet, Rfl: 0 .  loratadine (CLARITIN) 10 MG tablet, Take 1 tablet (10 mg total) by mouth daily., Disp: 30 tablet, Rfl: 11 .  montelukast (SINGULAIR) 10 MG tablet, Take 1 tablet (10 mg total) by mouth at bedtime., Disp: 30 tablet, Rfl: 11 .  potassium chloride SA (K-DUR,KLOR-CON) 20 MEQ tablet, Take 1 tablet (20 mEq total) by mouth daily., Disp: 30 tablet, Rfl: 2       Other Visit Diagnoses    Palpitations    -   Primary    Relevant Orders    ECHOCARDIOGRAM COMPLETE    Cardiac event monitor       Follow-up: Return in about 3 months (around 03/07/2016), or if symptoms worsen or fail to improve, for hypertension.  Loreen Freud, DO

## 2015-12-08 NOTE — Progress Notes (Signed)
Pre visit review using our clinic review tool, if applicable. No additional management support is needed unless otherwise documented below in the visit note. 

## 2015-12-09 DIAGNOSIS — M25552 Pain in left hip: Secondary | ICD-10-CM | POA: Insufficient documentation

## 2015-12-09 NOTE — Assessment & Plan Note (Signed)
Stable  Current outpatient prescriptions:  .  albuterol (PROVENTIL HFA;VENTOLIN HFA) 108 (90 Base) MCG/ACT inhaler, Inhale 2 puffs into the lungs every 4 (four) hours as needed., Disp: , Rfl:  .  cyclobenzaprine (FLEXERIL) 10 MG tablet, Take 1 tablet (10 mg total) by mouth at bedtime., Disp: 10 tablet, Rfl: 0 .  fluticasone (FLONASE) 50 MCG/ACT nasal spray, Place 2 sprays into both nostrils daily., Disp: 16 g, Rfl: 11 .  hydrochlorothiazide (HYDRODIURIL) 25 MG tablet, Take 1 tablet (25 mg total) by mouth daily., Disp: 90 tablet, Rfl: 0 .  loratadine (CLARITIN) 10 MG tablet, Take 1 tablet (10 mg total) by mouth daily., Disp: 30 tablet, Rfl: 11 .  montelukast (SINGULAIR) 10 MG tablet, Take 1 tablet (10 mg total) by mouth at bedtime., Disp: 30 tablet, Rfl: 11 .  potassium chloride SA (K-DUR,KLOR-CON) 20 MEQ tablet, Take 1 tablet (20 mEq total) by mouth daily., Disp: 30 tablet, Rfl: 2

## 2015-12-09 NOTE — Assessment & Plan Note (Signed)
Hip pain improved F/u prn

## 2015-12-18 ENCOUNTER — Telehealth: Payer: Self-pay | Admitting: Family Medicine

## 2015-12-18 MED ORDER — MONTELUKAST SODIUM 10 MG PO TABS: 10.0000 mg | ORAL_TABLET | Freq: Every day | ORAL | Status: DC

## 2015-12-18 NOTE — Telephone Encounter (Signed)
Rx faxed for 90.   KP

## 2015-12-18 NOTE — Telephone Encounter (Signed)
Caller name: Tobi Bastos with [facility] on [address] Can be reached: [phone]  Reason for call: Pt insurance is cheaper to get montelukast (SINGULAIR) 10 MG tablet  In 90 day supply. Please update RX or call them with approval to dispense in 90 day supply.

## 2015-12-21 MED ORDER — MONTELUKAST SODIUM 10 MG PO TABS: 10.0000 mg | ORAL_TABLET | Freq: Every day | ORAL | Status: DC

## 2015-12-21 NOTE — Telephone Encounter (Signed)
Got refill request for 90 days montelukast. Did send to [facility] pharmacy. Gave refills as well.

## 2015-12-30 ENCOUNTER — Other Ambulatory Visit: Payer: Self-pay

## 2015-12-30 ENCOUNTER — Ambulatory Visit (HOSPITAL_COMMUNITY): Payer: BLUE CROSS/BLUE SHIELD | Attending: Cardiovascular Disease

## 2015-12-30 ENCOUNTER — Ambulatory Visit (INDEPENDENT_AMBULATORY_CARE_PROVIDER_SITE_OTHER): Payer: BLUE CROSS/BLUE SHIELD

## 2015-12-30 DIAGNOSIS — R002 Palpitations: Secondary | ICD-10-CM | POA: Diagnosis not present

## 2015-12-30 DIAGNOSIS — I517 Cardiomegaly: Secondary | ICD-10-CM | POA: Diagnosis not present

## 2015-12-30 DIAGNOSIS — I10 Essential (primary) hypertension: Secondary | ICD-10-CM | POA: Insufficient documentation

## 2016-01-01 ENCOUNTER — Telehealth: Payer: Self-pay
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
## 2016-01-04 ENCOUNTER — Other Ambulatory Visit: Payer: Self-pay | Admitting: Family Medicine

## 2016-01-04 DIAGNOSIS — R002 Palpitations: Secondary | ICD-10-CM

## 2016-01-04 DIAGNOSIS — I471 Supraventricular tachycardia: Secondary | ICD-10-CM

## 2016-01-05 NOTE — Telephone Encounter (Signed)
Patient is aware of the below information and she has agreed to the Cardiology referral. She will go when the apt has been scheduled.    KP

## 2016-01-13 ENCOUNTER — Encounter: Payer: Self-pay | Admitting: Cardiovascular Disease

## 2016-01-13 ENCOUNTER — Ambulatory Visit (INDEPENDENT_AMBULATORY_CARE_PROVIDER_SITE_OTHER): Payer: BLUE CROSS/BLUE SHIELD | Admitting: Cardiovascular Disease

## 2016-01-13 VITALS — BP 124/74 | HR 96 | Ht 62.0 in | Wt 240.6 lb

## 2016-01-13 DIAGNOSIS — I10 Essential (primary) hypertension: Secondary | ICD-10-CM | POA: Diagnosis not present

## 2016-01-13 DIAGNOSIS — E8881 Metabolic syndrome: Secondary | ICD-10-CM

## 2016-01-13 DIAGNOSIS — R002 Palpitations: Secondary | ICD-10-CM | POA: Diagnosis not present

## 2016-01-13 DIAGNOSIS — E876 Hypokalemia: Secondary | ICD-10-CM

## 2016-01-13 DIAGNOSIS — G4733 Obstructive sleep apnea (adult) (pediatric): Secondary | ICD-10-CM

## 2016-01-13 NOTE — Patient Instructions (Signed)
Your physician recommends that you return for lab work fasting.  Your physician has recommended that you have a sleep study. This test records several body functions during sleep, including: brain activity, eye movement, oxygen and carbon dioxide blood levels, heart rate and rhythm, breathing rate and rhythm, the flow of air through your mouth and nose, snoring, body muscle movements, and chest and belly movement. This will be done at [facility].  Your physician recommends that you schedule a follow-up appointment in: 3-4 months.

## 2016-01-14 DIAGNOSIS — G4733 Obstructive sleep apnea (adult) (pediatric): Secondary | ICD-10-CM | POA: Insufficient documentation

## 2016-01-14 DIAGNOSIS — E8881 Metabolic syndrome: Secondary | ICD-10-CM | POA: Insufficient documentation

## 2016-01-14 DIAGNOSIS — E876 Hypokalemia: Secondary | ICD-10-CM | POA: Insufficient documentation

## 2016-01-14 DIAGNOSIS — R002 Palpitations: Secondary | ICD-10-CM | POA: Insufficient documentation

## 2016-01-14 LAB — CBC
HEMATOCRIT: 38.2 % (ref 36.0–46.0)
Hemoglobin: 12.9 g/dL (ref 12.0–15.0)
MCH: 30.2 pg (ref 26.0–34.0)
MCHC: 33.8 g/dL (ref 30.0–36.0)
MCV: 89.5 fL (ref 78.0–100.0)
MPV: 11.5 fL (ref 8.6–12.4)
Platelets: 276 10*3/uL (ref 150–400)
RBC: 4.27 MIL/uL (ref 3.87–5.11)
RDW: 14.2 % (ref 11.5–15.5)
WBC: 5.7 10*3/uL (ref 4.0–10.5)

## 2016-01-14 NOTE — Progress Notes (Signed)
Patient ID: Dana Patterson, female   DOB: 1976/04/26, 40 y.o.   MRN: [id number]     Primary MD:  Dr. Garnet Koyanagi  PATIENT PROFILE: Dana Patterson is a 40 y.o. female  Who is referred through the courtesy of Dr. Hubbard Hartshorn for evaluation of palpitations.  HPI:  Kaylana Fenstermacher  Admits to a several year history of hypertension for which recently she has been on hydrochlorothiazide 25 mg daily.  She also has a history of mild asthma for which he has been taking Singulair in addition to albuterol inhaler as needed.  She has a history of morbid obesity.  Upon further questioning, she admits that she was diagnosed as having obstructive sleep apnea possibly 7 years ago.  She initially started with CPAP therapy, but has not used this in over 5-6 years.  Recently, she has seen by Dr. Etter Sjogren for blood pressure as well as palpitations. A monitor was placed, which she is currently wearing. She notes occasional left sharp discomfort which is not exertionally precipitated. Recent laboratory in December, had demonstrated a low potassium level at 3.3 and she was given a prescription for 20 months of potassium advised to eat potassium-rich foods. Of note, her fasting glucose was 119.  She underwent a 2-D echo Doppler study on 12/30/2015 which revealed normal ejection fraction at  8560% with evidence for moderate left ventricular hypertrophy.  She now presents for cardiology evaluation.   Past Medical History  Diagnosis Date  . Allergy   . Hyperlipidemia   . HTN (hypertension) 10/29/2015    Past Surgical History  Procedure Laterality Date  . Abdominal hysterectomy      No Known Allergies  Current Outpatient Prescriptions  Medication Sig Dispense Refill  . albuterol (PROVENTIL HFA;VENTOLIN HFA) 108 (90 Base) MCG/ACT inhaler Inhale 2 puffs into the lungs every 4 (four) hours as needed.    . cyclobenzaprine (FLEXERIL) 10 MG tablet Take 1 tablet (10 mg total) by mouth at bedtime. 10 tablet 0  .  fluticasone (FLONASE) 50 MCG/ACT nasal spray Place 2 sprays into both nostrils daily. 16 g 11  . hydrochlorothiazide (HYDRODIURIL) 25 MG tablet Take 1 tablet (25 mg total) by mouth daily. 90 tablet 0  . loratadine (CLARITIN) 10 MG tablet Take 1 tablet (10 mg total) by mouth daily. 30 tablet 11  . montelukast (SINGULAIR) 10 MG tablet Take 1 tablet (10 mg total) by mouth at bedtime. 90 tablet 3  . potassium chloride SA (K-DUR,KLOR-CON) 20 MEQ tablet Take 1 tablet (20 mEq total) by mouth daily. 30 tablet 2   No current facility-administered medications for this visit.    Social History   Social History  . Marital Status: Married    Spouse Name: N/A  . Number of Children: N/A  . Years of Education: N/A   Occupational History  . Not on file.   Social History Main Topics  . Smoking status: Never Smoker   . Smokeless tobacco: Never Used  . Alcohol Use: No  . Drug Use: No  . Sexual Activity: Not on file   Other Topics Concern  . Not on file   Social History Narrative   Additional social history is notable that she is married for 12 years.  She has 4 children and 7 grandchildren.  She completed 12 th grade of education. She has worked in Energy manager. She never smoked tobacco.  There is no alcohol.  She occasionally walks but does not exercise vigorously.  Family History  Problem Relation Age  of Onset  . Heart disease Mother   . Kidney disease Mother   . Hypertension Mother   . Hypertension Father    Additional family history is notable that her mother died with heart problems and at hypertension, father had cancer and hypertension.  A maternal grandmother had sudden death.  She has 4 sisters wall have high blood pressure.  ROS General: Negative; No fevers, chills, or night sweats;  Positive for morbid obesity HEENT: Negative; No changes in vision or hearing, sinus congestion, difficulty swallowing Pulmonary: Negative; No cough, wheezing, shortness of breath,  hemoptysis Cardiovascular:  See HPI;  GI: Negative; No nausea, vomiting, diarrhea, or abdominal pain GU: Negative; No dysuria, hematuria, or difficulty voiding Musculoskeletal: Negative; no myalgias, joint pain, or weakness Hematologic/Oncologic: Negative; no easy bruising, bleeding Endocrine: Negative; no heat/cold intolerance; no diabetes Neuro: Negative; no changes in balance, headaches Skin: Negative; No rashes or skin lesions Psychiatric: Negative; No behavioral problems, depression Sleep:  Positive for untreated sleep apnea.  She does snore loudly.No daytime sleepiness, hypersomnolence, bruxism, restless legs, hypnogagnic hallucinations Other comprehensive 14 point system review is negative   Physical Exam BP 124/74 mmHg  Pulse 96  Ht '5\' 2"'$  (1.575 m)  Wt 240 lb 9.6 oz (109.135 kg)  BMI 43.99 kg/m2  Wt Readings from Last 3 Encounters:  01/13/16 240 lb 9.6 oz (109.135 kg)  12/08/15 234 lb (106.142 kg)  11/17/15 237 lb 9.6 oz (107.775 kg)   General: Alert, oriented, no distress.  Skin: normal turgor, no rashes, warm and dry HEENT: Normocephalic, atraumatic. Pupils equal round and reactive to light; sclera anicteric; extraocular muscles intact; Fundi without hemorrhages or exudates.  Disks flat Nose without nasal septal hypertrophy Mouth/Parynx benign; Mallinpatti scale 4 Neck: No JVD, no carotid bruits; normal carotid upstroke Lungs: clear to ausculatation and percussion; no wheezing or rales Chest wall: without tenderness to palpitation Heart: PMI not displaced, RRR, s1 s2 normal, 1/6 systolic murmur, no diastolic murmur, no rubs, gallops, thrills, or heaves Abdomen:  Significant central adiposity; soft, nontender; no hepatosplenomehaly, BS+; abdominal aorta nontender and not dilated by palpation. Back: no CVA tenderness Pulses 2+ Musculoskeletal: full range of motion, normal strength, no joint deformities Extremities: no clubbing cyanosis or edema, Homan's sign negative   Neurologic: grossly nonfocal; Cranial nerves grossly wnl Psychologic: Normal mood and affect   ECG (independently read by me):  Normal sinus rhythm at 96 bpm. Inferior T-wave abnormality in leads 3 and aVF and non- specific change in V6.  An Epworth Sleepiness Scale score was endorsed today at 3 arguing against excessive daytime sleepiness  LABS:  BMP Latest Ref Rng 11/17/2015 10/29/2015  Glucose 70 - 99 mg/dL 119(H) 86  BUN 6 - 23 mg/dL 10 11  Creatinine 0.40 - 1.20 mg/dL 0.64 0.65  Sodium 135 - 145 mEq/L 138 140  Potassium 3.5 - 5.1 mEq/L 3.3(L) 3.6  Chloride 96 - 112 mEq/L 102 101  CO2 19 - 32 mEq/L 29 31  Calcium 8.4 - 10.5 mg/dL 9.7 9.9     Hepatic Function Latest Ref Rng 11/17/2015 10/29/2015  Total Protein 6.0 - 8.3 g/dL 7.3 7.9  Albumin 3.5 - 5.2 g/dL 4.1 4.3  AST 0 - 37 U/L 23 22  ALT 0 - 35 U/L 29 36(H)  Alk Phosphatase 39 - 117 U/L 82 87  Total Bilirubin 0.2 - 1.2 mg/dL 0.3 0.3    CBC Latest Ref Rng 11/17/2015 10/29/2015  WBC 4.0 - 10.5 K/uL 6.0 6.4  Hemoglobin 12.0 - 15.0  g/dL 13.2 13.8  Hematocrit 36.0 - 46.0 % 39.3 40.8  Platelets 150.0 - 400.0 K/uL 232.0 291.0   Lab Results  Component Value Date   MCV 89.7 11/17/2015   MCV 89.6 10/29/2015   Lab Results  Component Value Date   TSH 1.200 11/17/2015   No results found for: HGBA1C   BNP No results found for: BNP  ProBNP No results found for: PROBNP   Lipid Panel  No results found for: CHOL, TRIG, HDL, CHOLHDL, VLDL, LDLCALC, LDLDIRECT  RADIOLOGY: No results found.   ASSESSMENT AND PLAN:  Mrs. Charday Capetillo is a 40 year old female who is morbidly obese and has a history of hypertension for which recently she has been maintained on hydrochlorothiazide 25 mg daily.  She recently was found to be hypokalemic with a potassium of 3.3, and has been started on supplemental potassium 20 lymphs daily.  She had experienced episodic palpitations but denies any associated presyncope or syncope.  She is  currently wearing a cardiac monitor to evaluate potential arrhythmia.  I spent a long time with her today regarding her untreated sleep apnea.  She has not used CPAP therapy for over 5-6 years.  Her sleep is poor.  She has frequent awakenings.  She does snore.  In her sleep is nonrestorative.  I discussed the cardiovascular implications associated with untreated sleep apnea. and the possibility that this may be contributing to her hypertension as well as her palpitations.  For this reason, I have recommended that she undergo a follow-up sleep study , which will be scheduled as a split-night protocol plans to initiate CPAP therapy if positive. With her mild glucose elevation , morbid obesity, and hypertension, she meets criteria for metabolic syndrome. I discussed with her potential hyperinsulinemia which may have negative implications in her future health.  She does have significant family history for hypertension and heart disease in her maternal grandmother died suddenly.  I am recommending laboratory be obtained in the fasting state.  I will  include a hemoglobin A1c as well as a baseline  Insulin level , lipid panel and magnesium. We discussed the importance of weight loss and exercise.  Adjustments to her medications will be done pending laboratory results.  I will review the results of her cardiac monitor when available.  I will see her back in the office in follow-up of her sleep study and further recommendations will be made at that time.  Time spent: 45 minutes Troy Sine, MD, [facility] 01/14/2016 7:10 PM

## 2016-01-15 LAB — MAGNESIUM: MAGNESIUM: 1.9 mg/dL (ref 1.5–2.5)

## 2016-01-15 LAB — COMPREHENSIVE METABOLIC PANEL
ALK PHOS: 71 U/L (ref 33–115)
ALT: 38 U/L — AB (ref 6–29)
AST: 30 U/L (ref 10–30)
Albumin: 3.9 g/dL (ref 3.6–5.1)
BUN: 13 mg/dL (ref 7–25)
CALCIUM: 9.6 mg/dL (ref 8.6–10.2)
CO2: 27 mmol/L (ref 20–31)
Chloride: 101 mmol/L (ref 98–110)
Creat: 0.71 mg/dL (ref 0.50–1.10)
Glucose, Bld: 83 mg/dL (ref 65–99)
POTASSIUM: 3.9 mmol/L (ref 3.5–5.3)
Sodium: 140 mmol/L (ref 135–146)
TOTAL PROTEIN: 7.2 g/dL (ref 6.1–8.1)
Total Bilirubin: 0.4 mg/dL (ref 0.2–1.2)

## 2016-01-15 LAB — HEMOGLOBIN A1C
HEMOGLOBIN A1C: 5.9 % — AB (ref <5.7)
MEAN PLASMA GLUCOSE: 123 mg/dL — AB (ref <117)

## 2016-01-15 LAB — INSULIN, FASTING: INSULIN FASTING, SERUM: 19.5 u[IU]/mL (ref 2.0–19.6)

## 2016-01-19 ENCOUNTER — Ambulatory Visit (HOSPITAL_BASED_OUTPATIENT_CLINIC_OR_DEPARTMENT_OTHER): Payer: BLUE CROSS/BLUE SHIELD | Attending: Cardiovascular Disease | Admitting: Radiology

## 2016-01-19 DIAGNOSIS — R0683 Snoring: Secondary | ICD-10-CM | POA: Insufficient documentation

## 2016-01-19 DIAGNOSIS — G4733 Obstructive sleep apnea (adult) (pediatric): Secondary | ICD-10-CM | POA: Diagnosis present

## 2016-01-19 DIAGNOSIS — Z79899 Other long term (current) drug therapy: Secondary | ICD-10-CM | POA: Diagnosis not present

## 2016-01-19 DIAGNOSIS — I10 Essential (primary) hypertension: Secondary | ICD-10-CM | POA: Insufficient documentation

## 2016-01-29 ENCOUNTER — Telehealth: Payer: Self-pay

## 2016-01-29 NOTE — Telephone Encounter (Signed)
Pre Vist Call Completed

## 2016-02-01 ENCOUNTER — Encounter: Payer: Self-pay | Admitting: Family Medicine

## 2016-02-01 ENCOUNTER — Ambulatory Visit (INDEPENDENT_AMBULATORY_CARE_PROVIDER_SITE_OTHER): Payer: BLUE CROSS/BLUE SHIELD | Admitting: Family Medicine

## 2016-02-01 VITALS — BP 110/70 | HR 95 | Temp 99.2°F | Ht 62.0 in | Wt 234.2 lb

## 2016-02-01 DIAGNOSIS — I10 Essential (primary) hypertension: Secondary | ICD-10-CM

## 2016-02-01 DIAGNOSIS — E8881 Metabolic syndrome: Secondary | ICD-10-CM

## 2016-02-01 DIAGNOSIS — R739 Hyperglycemia, unspecified: Secondary | ICD-10-CM | POA: Diagnosis not present

## 2016-02-01 DIAGNOSIS — G4733 Obstructive sleep apnea (adult) (pediatric): Secondary | ICD-10-CM

## 2016-02-01 DIAGNOSIS — Z1231 Encounter for screening mammogram for malignant neoplasm of breast: Secondary | ICD-10-CM

## 2016-02-01 MED ORDER — HYDROCHLOROTHIAZIDE 25 MG PO TABS: 25.0000 mg | ORAL_TABLET | Freq: Every day | ORAL | Status: DC

## 2016-02-01 NOTE — Patient Instructions (Signed)

## 2016-02-01 NOTE — Progress Notes (Signed)
Pre visit review using our clinic review tool, if applicable. No additional management support is needed unless otherwise documented below in the visit note. 

## 2016-02-01 NOTE — Progress Notes (Signed)
Patient ID: Dana KitchensStephanie Esqueda, female    DOB: [birth year]  Age: 40 y.o. MRN: [id number]    Subjective:  Subjective HPI Dana Patterson presents to establish and go over results from another provider.  She has a hx of htn, metabolic syndrome, and osa.    Review of Systems  Constitutional: Negative for diaphoresis, appetite change, fatigue and unexpected weight change.  Eyes: Negative for pain, redness and visual disturbance.  Respiratory: Negative for cough, chest tightness, shortness of breath and wheezing.   Cardiovascular: Negative for chest pain, palpitations and leg swelling.  Endocrine: Negative for cold intolerance, heat intolerance, polydipsia, polyphagia and polyuria.  Genitourinary: Negative for dysuria, frequency and difficulty urinating.  Neurological: Negative for dizziness, light-headedness, numbness and headaches.    History Past Medical History  Diagnosis Date  . Allergy   . Hyperlipidemia   . HTN (hypertension) 10/29/2015    She has past surgical history that includes Abdominal hysterectomy.   Her family history includes Heart disease in her mother; Hypertension in her father and mother; Kidney disease in her mother.She reports that she has never smoked. She has never used smokeless tobacco. She reports that she does not drink alcohol or use illicit drugs.  Current Outpatient Prescriptions on File Prior to Visit  Medication Sig Dispense Refill  . albuterol (PROVENTIL HFA;VENTOLIN HFA) 108 (90 Base) MCG/ACT inhaler Inhale 2 puffs into the lungs every 4 (four) hours as needed.    . fluticasone (FLONASE) 50 MCG/ACT nasal spray Place 2 sprays into both nostrils daily. 16 g 11  . loratadine (CLARITIN) 10 MG tablet Take 1 tablet (10 mg total) by mouth daily. 30 tablet 11  . montelukast (SINGULAIR) 10 MG tablet Take 1 tablet (10 mg total) by mouth at bedtime. 90 tablet 3  . potassium chloride SA (K-DUR,KLOR-CON) 20 MEQ tablet Take 1 tablet (20 mEq total) by mouth daily. 30  tablet 2   No current facility-administered medications on file prior to visit.     Objective:  Objective Physical Exam  Constitutional: She is oriented to person, place, and time. She appears well-developed and well-nourished.  HENT:  Head: Normocephalic and atraumatic.  Eyes: Conjunctivae and EOM are normal.  Neck: Normal range of motion. Neck supple. No JVD present. Carotid bruit is not present. No thyromegaly present.  Cardiovascular: Normal rate, regular rhythm and normal heart sounds.   No murmur heard. Pulmonary/Chest: Effort normal and breath sounds normal. No respiratory distress. She has no wheezes. She has no rales. She exhibits no tenderness.  Musculoskeletal: She exhibits no edema.  Neurological: She is alert and oriented to person, place, and time.  Psychiatric: She has a normal mood and affect.  Nursing note and vitals reviewed.  BP 110/70 mmHg  Pulse 95  Temp(Src) 99.2 F (37.3 C) (Oral)  Ht 5\' 2"  (1.575 m)  Wt 234 lb 3.2 oz (106.232 kg)  BMI 42.82 kg/m2  SpO2 99% Wt Readings from Last 3 Encounters:  02/01/16 234 lb 3.2 oz (106.232 kg)  01/19/16 238 lb (107.956 kg)  01/13/16 240 lb 9.6 oz (109.135 kg)     Lab Results  Component Value Date   WBC 5.7 01/13/2016   HGB 12.9 01/13/2016   HCT 38.2 01/13/2016   PLT 276 01/13/2016   GLUCOSE 83 01/13/2016   ALT 38* 01/13/2016   AST 30 01/13/2016   NA 140 01/13/2016   K 3.9 01/13/2016   CL 101 01/13/2016   CREATININE 0.71 01/13/2016   BUN 13 01/13/2016   CO2 27  01/13/2016   TSH 1.200 11/17/2015   HGBA1C 5.9* 01/13/2016    Dg Shoulder Left  10/29/2015  CLINICAL DATA:  Chronic left shoulder pain with no known injury EXAM: LEFT SHOULDER - 2+ VIEW COMPARISON:  Chest x-ray of January 30, 2015 which included portions of the left shoulder. FINDINGS: The bones are adequately mineralized. There is a small amount of calcification in the region of the insertion of the rotator cuff on the greater tuberosity of the  humerus. The glenohumeral and AC joint spaces are preserved. The subacromial subdeltoid space appears normal. The observed portions of the left clavicle and upper left ribs are normal. IMPRESSION: Calcification in the region of the insertion of the rotator cuff on the greater tuberosity of the humerus. Otherwise no significant abnormality of the shoulder. If rotator cuff or glenoid labrum abnormality is suspected, MRI would be a useful next imaging step. Electronically Signed   By: David  [place] M.D.   On: 10/29/2015 12:35     Assessment & Plan:  Plan I am having Ms. Keough maintain her loratadine, fluticasone, potassium chloride SA, albuterol, montelukast, and hydrochlorothiazide.  Meds ordered this encounter  Medications  . hydrochlorothiazide (HYDRODIURIL) 25 MG tablet    Sig: Take 1 tablet (25 mg total) by mouth daily.    Dispense:  90 tablet    Refill:  1    Problem List Items Addressed This Visit    None    Visit Diagnoses    Hyperglycemia    -  Primary    Relevant Orders    Comprehensive metabolic panel    Hemoglobin A1c    Lipid panel    POCT urinalysis dipstick    Microalbumin / creatinine urine ratio    MM DIGITAL SCREENING BILATERAL    Morbid obesity due to excess calories (HCC)        Relevant Orders    MM DIGITAL SCREENING BILATERAL    OSA (obstructive sleep apnea)        Relevant Orders    MM DIGITAL SCREENING BILATERAL    Essential hypertension        Relevant Medications    hydrochlorothiazide (HYDRODIURIL) 25 MG tablet    Other Relevant Orders    MM DIGITAL SCREENING BILATERAL    Encounter for screening mammogram for breast cancer        Relevant Orders    MM DIGITAL SCREENING BILATERAL       Follow-up: Return in about 3 months (around 05/03/2016), or if symptoms worsen or fail to improve, for labs.  Loreen Freud, DO

## 2016-02-03 ENCOUNTER — Telehealth: Payer: Self-pay | Admitting: Cardiovascular Disease

## 2016-02-03 NOTE — Assessment & Plan Note (Signed)
Stable  con't hctz  

## 2016-02-03 NOTE — Assessment & Plan Note (Signed)
Discuss diet and exercise Repeat labs

## 2016-02-03 NOTE — Telephone Encounter (Signed)
New message  Pt called states that she had her Sleep study completed early. Will she need an earlier follow up or keep it in June. Please call to advise

## 2016-02-03 NOTE — Telephone Encounter (Signed)
Returned call to patient.She stated she had sleep study 01/18/16.Stated PCP wanted her to ask Dr.Kelly if he wanted to order CPAP or does he want PCP to order.Dr.Kelly and Burna MortimerWanda out of office today.Advised [facility].

## 2016-02-04 NOTE — Telephone Encounter (Signed)
Left message that Dr Tresa EndoKelly will order CPAP if needed. It appears that the study has not been read yet.

## 2016-02-07 NOTE — Sleep Study (Signed)
Patient Name: Dana Patterson, Dana Patterson Date: 01/19/2016 Gender: Female D.O.B: Mar 15, 1976 Age (years): 2 Referring Provider: Shelva Majestic MD, ABSM Height (inches): 62 Interpreting Physician: Shelva Majestic MD, ABSM Weight (lbs): 238 RPSGT: Carolin Coy BMI: 81 MRN: [id number] Neck Size: 15.50  CLINICAL INFORMATION Sleep Study Type: Split Night CPAP Indication for sleep study: Hypertension, OSA, Snoring Epworth Sleepiness Score: 3  SLEEP STUDY TECHNIQUE As per the AASM Manual for the Scoring of Sleep and Associated Events v2.3 (April 2016) with a hypopnea requiring 4% desaturations. The channels recorded and monitored were frontal, central and occipital EEG, electrooculogram (EOG), submentalis EMG (chin), nasal and oral airflow, thoracic and abdominal wall motion, anterior tibialis EMG, snore microphone, electrocardiogram, and pulse oximetry. Continuous positive airway pressure (CPAP) was initiated when the patient met split night criteria and was titrated according to treat sleep-disordered breathing.  MEDICATIONS  albuterol (PROVENTIL HFA;VENTOLIN HFA) 108 (90 Base) MCG/ACT inhaler 2 puff, Every 4 hours PRN     Note: Received from: [place]: Inhale into the lungs. (Written 12/08/2015 1019)   fluticasone (FLONASE) 50 MCG/ACT nasal spray 2 spray, Daily     hydrochlorothiazide (HYDRODIURIL) 25 MG tablet 25 mg, Daily     loratadine (CLARITIN) 10 MG tablet 10 mg, Daily     montelukast (SINGULAIR) 10 MG tablet 10 mg, Daily at bedtime     potassium chloride SA (K-DUR,KLOR-CON) 20 MEQ    Medications administered by patient during sleep study : No sleep medicine administered.  RESPIRATORY PARAMETERS Diagnostic Total AHI (/hr): 19.7 RDI (/hr): 23.3 OA Index (/hr): 4.2 CA Index (/hr): 0.0 REM AHI (/hr): 99.1 NREM AHI (/hr): 14.9 Supine AHI (/hr): 23.0 Non-supine AHI (/hr): 0.00 Min O2 Sat (%): 81.00 Mean O2 (%): 95.92 Time below 88% (min): 3.6   Titration Optimal  Pressure (cm): 9 AHI at Optimal Pressure (/hr): N/A Min O2 at Optimal Pressure (%): 90.00 Supine % at Optimal (%): N/A Sleep % at Optimal (%): N/A    SLEEP ARCHITECTURE The recording time for the entire night was 376.5 minutes. During a baseline period of 206.7 minutes, the patient slept for 200.7 minutes in REM and nonREM, yielding a sleep efficiency of 97.1%. Sleep onset after lights out was 1.5 minutes with a REM latency of 158.5 minutes. The patient spent 12.45% of the night in stage N1 sleep, 81.82% in stage N2 sleep, 0.00% in stage N3 and 5.73% in REM. During the titration period of 164.3 minutes, the patient slept for 157.0 minutes in REM and nonREM, yielding a sleep efficiency of 95.5%. Sleep onset after CPAP initiation was 4.8 minutes with a REM latency of 19.5 minutes. The patient spent 11.46% of the night in stage N1 sleep, 42.36% in stage N2 sleep, 0.00% in stage N3 and 46.18% in REM.  CARDIAC DATA The 2 lead EKG demonstrated sinus rhythm. The mean heart rate was 80.88 beats per minute. Other EKG findings include: None.  LEG MOVEMENT DATA The total Periodic Limb Movements of Sleep (PLMS) were 0. The PLMS index was 0.00 .  IMPRESSIONS - Moderate obstructive sleep apnea overall with an AHI of 19.7/h; however, very severe sleep apnea during REM sleep (AHI 99.1/hr).   An optimal PAP pressure was selected for this patient ( 9 cm of water) - No significant central sleep apnea occurred during the diagnostic portion of the study (CAI = 0.0/hour). - Moderate oxygen desaturation during the diagnostic portion of the study (Min O2 = 81.00%); time below 88% was 3.6 minutes. - Abnormal  sleep architecture with absence of slow wave sleep and prolonged latency to REM sleep on the diagnostic portion of the study; suspect REM rebound (46.8%) with CPAP initiation.  - Soft snoring volume during the diagnostic portion of the study. - No cardiac abnormalities were noted during this study. - Clinically  significant periodic limb movements did not occur during sleep.  DIAGNOSIS - Obstructive Sleep Apnea (327.23 [G47.33 ICD-10])  RECOMMENDATIONS - Recommend an initial trial of CPAP therapy on 9 cm H2O with a Medium size Resmed Nasal Pillow Mask AirFit P10 mask and heated humidification. - Avoid alcohol, sedatives and other CNS depressants that may worsen sleep apnea and disrupt normal sleep architecture. - Sleep hygiene should be reviewed to assess factors that may improve sleep quality. - Weight management (BMI 44) and regular exercise should be initiated. - Recommend a download in 30 days and sleep clinic evaluation.   Troy Sine, MD, [place], American Board of Sleep Medicine  ELECTRONICALLY SIGNED ON:  02/07/2016, 9:29 AM [place] PH: [phone]   FX: [phone] [place]

## 2016-02-08 ENCOUNTER — Telehealth: Payer: Self-pay | Admitting: *Deleted

## 2016-02-08 ENCOUNTER — Ambulatory Visit (HOSPITAL_BASED_OUTPATIENT_CLINIC_OR_DEPARTMENT_OTHER): Payer: BLUE CROSS/BLUE SHIELD

## 2016-02-08 NOTE — Progress Notes (Signed)
Patient notified of results and recommendations. [facility] referral sent to [facility].

## 2016-02-08 NOTE — Telephone Encounter (Signed)
-----   Message from Lennette Biharihomas A Kelly, MD sent at 02/07/2016  9:40 AM EDT ----- Burna MortimerWanda; please set up with DME for CPAP initiation and f/u sleep clinic.

## 2016-02-08 NOTE — Telephone Encounter (Signed)
Patient notified of results and recommendations of sleep study. 

## 2016-02-09 ENCOUNTER — Ambulatory Visit (HOSPITAL_BASED_OUTPATIENT_CLINIC_OR_DEPARTMENT_OTHER)
Admission: RE | Admit: 2016-02-09 | Discharge: 2016-02-09 | Disposition: A | Discharge status: Home / Self Care | Payer: BLUE CROSS/BLUE SHIELD | Source: Ambulatory Visit | Attending: Family Medicine | Admitting: Family Medicine

## 2016-02-09 DIAGNOSIS — Z1231 Encounter for screening mammogram for malignant neoplasm of breast: Secondary | ICD-10-CM | POA: Diagnosis present

## 2016-02-09 DIAGNOSIS — I10 Essential (primary) hypertension: Secondary | ICD-10-CM

## 2016-02-09 DIAGNOSIS — R739 Hyperglycemia, unspecified: Secondary | ICD-10-CM | POA: Insufficient documentation

## 2016-02-09 DIAGNOSIS — G4733 Obstructive sleep apnea (adult) (pediatric): Secondary | ICD-10-CM | POA: Diagnosis not present

## 2016-03-10 ENCOUNTER — Encounter (HOSPITAL_BASED_OUTPATIENT_CLINIC_OR_DEPARTMENT_OTHER): Payer: BLUE CROSS/BLUE SHIELD

## 2016-04-05 ENCOUNTER — Emergency Department (HOSPITAL_BASED_OUTPATIENT_CLINIC_OR_DEPARTMENT_OTHER)
Admission: EM | Admit: 2016-04-05 | Discharge: 2016-04-06 | Disposition: A | Discharge status: Home / Self Care | Payer: BLUE CROSS/BLUE SHIELD | Attending: Emergency Medicine | Admitting: Emergency Medicine

## 2016-04-05 ENCOUNTER — Encounter (HOSPITAL_BASED_OUTPATIENT_CLINIC_OR_DEPARTMENT_OTHER): Payer: Self-pay | Admitting: *Deleted

## 2016-04-05 DIAGNOSIS — R0789 Other chest pain: Secondary | ICD-10-CM | POA: Diagnosis not present

## 2016-04-05 DIAGNOSIS — E785 Hyperlipidemia, unspecified: Secondary | ICD-10-CM | POA: Diagnosis not present

## 2016-04-05 DIAGNOSIS — J029 Acute pharyngitis, unspecified: Secondary | ICD-10-CM | POA: Diagnosis not present

## 2016-04-05 DIAGNOSIS — I10 Essential (primary) hypertension: Secondary | ICD-10-CM | POA: Insufficient documentation

## 2016-04-05 DIAGNOSIS — R05 Cough: Secondary | ICD-10-CM | POA: Diagnosis present

## 2016-04-05 MED ORDER — AZITHROMYCIN 500 MG PO TABS: 500.0000 mg | ORAL_TABLET | Freq: Every day | ORAL | Status: DC

## 2016-04-05 NOTE — ED Notes (Signed)
Pt c/o URI symptoms x 3 days 

## 2016-04-05 NOTE — ED Provider Notes (Signed)
CSN: [id number]     Arrival date & time 04/05/16  2310 History   First MD Initiated Contact with Patient 04/05/16 2340     Chief Complaint  Patient presents with  . URI   (Consider location/radiation/quality/duration/timing/severity/associated sxs/prior Treatment) HPI 40 y.o. female presents to the Emergency Department today complaining of URI symptoms x 3 days. Associated cough, congestion, rhinorrhea, generalized body aches. Noted fever with [id number]. Has not taken any OTC remedies. Notes sick contacts with son.    Past Medical History  Diagnosis Date  . Allergy   . Hyperlipidemia   . HTN (hypertension) 10/29/2015   Past Surgical History  Procedure Laterality Date  . Abdominal hysterectomy     Family History  Problem Relation Age of Onset  . Heart disease Mother   . Kidney disease Mother   . Hypertension Mother   . Hypertension Father    Social History  Substance Use Topics  . Smoking status: Never Smoker   . Smokeless tobacco: Never Used  . Alcohol Use: No   OB History    No data available     Review of Systems  Constitutional: Positive for fever.  HENT: Positive for congestion, sinus pressure and sore throat.   Respiratory: Positive for chest tightness. Negative for shortness of breath.   Cardiovascular: Negative for chest pain.  Gastrointestinal: Negative for nausea and vomiting.   Allergies  Review of patient's allergies indicates no known allergies.  Home Medications   Prior to Admission medications   Medication Sig Start Date End Date Taking? Authorizing Provider  albuterol (PROVENTIL HFA;VENTOLIN HFA) 108 (90 Base) MCG/ACT inhaler Inhale 2 puffs into the lungs every 4 (four) hours as needed. 11/23/15 11/22/16  Historical Provider, MD  fluticasone (FLONASE) 50 MCG/ACT nasal spray Place 2 sprays into both nostrils daily. 10/29/15   Ramon Dredge Saguier, PA-C  hydrochlorothiazide (HYDRODIURIL) 25 MG tablet Take 1 tablet (25 mg total) by mouth daily. 02/01/16   Lelon Perla Chase, DO  loratadine (CLARITIN) 10 MG tablet Take 1 tablet (10 mg total) by mouth daily. 10/29/15   Ramon Dredge Saguier, PA-C  montelukast (SINGULAIR) 10 MG tablet Take 1 tablet (10 mg total) by mouth at bedtime. 12/21/15   Ramon Dredge Saguier, PA-C  potassium chloride SA (K-DUR,KLOR-CON) 20 MEQ tablet Take 1 tablet (20 mEq total) by mouth daily. 11/24/15   Yvonne R Lowne Chase, DO   BP 128/87 mmHg  Pulse 120  Temp(Src) 100.5 F (38.1 C)  Resp 18  Ht  (1.575 m)  Wt 104.327 kg  BMI 42.06 kg/m2  SpO2 98%   Physical Exam  Constitutional: She is oriented to person, place, and time. She appears well-developed and well-nourished. No distress.  HENT:  Head: Normocephalic and atraumatic.  Right Ear: Tympanic membrane, external ear and ear canal normal.  Left Ear: Tympanic membrane, external ear and ear canal normal.  Nose: Nose normal.  Mouth/Throat: Uvula is midline and mucous membranes are normal. No trismus in the jaw. Oropharyngeal exudate and posterior oropharyngeal erythema present. No tonsillar abscesses.  Eyes: EOM are normal. Pupils are equal, round, and reactive to light.  Neck: Normal range of motion. Neck supple. No tracheal deviation present.  Cardiovascular: Normal rate, regular rhythm, S1 normal, S2 normal, normal heart sounds, intact distal pulses and normal pulses.   Pulmonary/Chest: Effort normal and breath sounds normal. No respiratory distress. She has no decreased breath sounds. She has no wheezes. She has no rhonchi. She has no rales.  Abdominal: Normal appearance and bowel  sounds are normal. There is no tenderness.  Musculoskeletal: Normal range of motion.  Neurological: She is alert and oriented to person, place, and time.  Skin: Skin is warm and dry.  Psychiatric: She has a normal mood and affect. Her speech is normal and behavior is normal. Thought content normal.   ED Course  Procedures (including critical care time) Labs Review Labs Reviewed - No data to  display  Imaging Review No results found. I have personally reviewed and evaluated these images and lab results as part of my medical decision-making.   EKG Interpretation None      MDM  I have reviewed the relevant previous healthcare records.I obtained HPI from historian.  ED Course:  Assessment: Pt is a 40yF who presents with URI symptoms x 3 days. On exam, pt in NAD. Nontoxic/nonseptic appearing. VSS. febrile. Lungs CTA. Heart RRR. Abdomen nontender soft. Posterior oropharynx erythematous with exudate. Given Rx Azithromycin. Plan is to DC home with follow up to PCP. At time of discharge, Patient is in no acute distress. Vital Signs are stable. Patient is able to ambulate. Patient able to tolerate PO.    Disposition/Plan:  DC home Additional Verbal discharge instructions given and discussed with patient.  Pt Instructed to f/u with PCP in the next week for evaluation and treatment of symptoms. Return precautions given Pt acknowledges and agrees with plan  Supervising Physician April Palumbo, MD   Final diagnoses:  Pharyngitis       Audry Piliyler Esaul Dorwart, PA-C 04/05/16 2354  April Palumbo, MD 04/06/16 0422

## 2016-04-05 NOTE — Discharge Instructions (Signed)
Please read and follow all provided instructions.  Your diagnoses today include:  1. Pharyngitis    Tests performed today include:  Vital signs. See below for your results today.   Medications prescribed:   Take as prescribed   Home care instructions:  Follow any educational materials contained in this packet.  Follow-up instructions: Please follow-up with your primary care provider in the next week for further evaluation of symptoms and treatment   Return instructions:   Please return to the Emergency Department if you do not get better, if you get worse, or new symptoms OR  - Fever (temperature greater than 101.23F)  - Bleeding that does not stop with holding pressure to the area    -Severe pain (please note that you may be more sore the day after your accident)  - Chest Pain  - Difficulty breathing  - Severe nausea or vomiting  - Inability to tolerate food and liquids  - Passing out  - Skin becoming red around your wounds  - Change in mental status (confusion or lethargy)  - New numbness or weakness     Please return if you have any other emergent concerns.  Additional Information:  Your vital signs today were: BP 128/87 mmHg   Pulse 120   Temp(Src) 100.5 F (38.1 C)   Resp 18   Ht 5\' 2"  (1.575 m)   Wt 104.327 kg   BMI 42.06 kg/m2   SpO2 98% If your blood pressure (BP) was elevated above 135/85 this visit, please have this repeated by your doctor within one month. ---------------

## 2016-05-03 ENCOUNTER — Other Ambulatory Visit (INDEPENDENT_AMBULATORY_CARE_PROVIDER_SITE_OTHER): Payer: BLUE CROSS/BLUE SHIELD

## 2016-05-03 DIAGNOSIS — R739 Hyperglycemia, unspecified: Secondary | ICD-10-CM

## 2016-05-03 LAB — COMPREHENSIVE METABOLIC PANEL
ALT: 27 U/L (ref 0–35)
AST: 20 U/L (ref 0–37)
Albumin: 4 g/dL (ref 3.5–5.2)
Alkaline Phosphatase: 66 U/L (ref 39–117)
BILIRUBIN TOTAL: 0.3 mg/dL (ref 0.2–1.2)
BUN: 12 mg/dL (ref 6–23)
CALCIUM: 9.3 mg/dL (ref 8.4–10.5)
CO2: 26 meq/L (ref 19–32)
CREATININE: 0.68 mg/dL (ref 0.40–1.20)
Chloride: 102 mEq/L (ref 96–112)
GFR: 121.81 mL/min (ref >60.00)
GLUCOSE: 92 mg/dL (ref 70–99)
Potassium: 3.3 mEq/L — ABNORMAL LOW (ref 3.5–5.1)
Sodium: 136 mEq/L (ref 135–145)
Total Protein: 7.1 g/dL (ref 6.0–8.3)

## 2016-05-03 LAB — MICROALBUMIN / CREATININE URINE RATIO
CREATININE, U: 129.2 mg/dL
MICROALB/CREAT RATIO: 0.5 mg/g (ref 0.0–30.0)
Microalb, Ur: <0.7 mg/dL (ref 0.0–1.9)

## 2016-05-03 LAB — LIPID PANEL
CHOL/HDL RATIO: 4
Cholesterol: 208 mg/dL — ABNORMAL HIGH (ref 0–200)
HDL: 53.3 mg/dL (ref >39.00)
LDL CALC: 130 mg/dL — AB (ref 0–99)
NONHDL: 154.53
Triglycerides: 121 mg/dL (ref 0.0–149.0)
VLDL: 24.2 mg/dL (ref 0.0–40.0)

## 2016-05-03 LAB — HEMOGLOBIN A1C: Hgb A1c MFr Bld: 5.8 % (ref 4.6–6.5)

## 2016-05-12 ENCOUNTER — Encounter: Payer: Self-pay | Admitting: Cardiovascular Disease

## 2016-05-12 ENCOUNTER — Ambulatory Visit (INDEPENDENT_AMBULATORY_CARE_PROVIDER_SITE_OTHER): Payer: BLUE CROSS/BLUE SHIELD | Admitting: Cardiovascular Disease

## 2016-05-12 VITALS — BP 121/84 | HR 94 | Ht 62.0 in | Wt 230.8 lb

## 2016-05-12 DIAGNOSIS — R002 Palpitations: Secondary | ICD-10-CM | POA: Diagnosis not present

## 2016-05-12 DIAGNOSIS — G4733 Obstructive sleep apnea (adult) (pediatric): Secondary | ICD-10-CM

## 2016-05-12 DIAGNOSIS — I10 Essential (primary) hypertension: Secondary | ICD-10-CM

## 2016-05-12 NOTE — Patient Instructions (Addendum)
Medication Instructions:   Your physician recommends that you continue on your current medications as directed. Please refer to the Current Medication list given to you today.  If you need a refill on your cardiac medications before your next appointment, please call your pharmacy.  Labwork: NONE ORDER TODAY    Testing/Procedures:  NONE ORDER TODAY    Follow-Up:  Your physician wants you to follow-up in: ONE YEAR WITH [facility] FOR SLEEP   You will receive a reminder letter in the mail two months in advance. If you don't receive a letter, please call our office to schedule the follow-up appointment.     Any Other Special Instructions Will Be Listed Below (If Applicable).                                                                                                                                                   

## 2016-05-14 ENCOUNTER — Encounter: Payer: Self-pay | Admitting: Cardiovascular Disease

## 2016-05-14 NOTE — Progress Notes (Signed)
Patient ID: Dana Patterson, female   DOB: 01/29/1976, 40 y.o.   MRN: [id number]     Primary MD:  Dr. Garnet Koyanagi  PATIENT PROFILE: Dana Patterson is a 40 y.o. female  Who is referred through the courtesy of Dr. Hubbard Hartshorn for evaluation of palpitations.  HPI:  Dana Patterson has a history of hypertension for which recently she has been on hydrochlorothiazide 25 mg daily.  She also has a history of mild asthma for which he has been taking Singulair in addition to albuterol inhaler as needed.  She has a history of morbid obesity.  Upon further questioning, she admits that she was diagnosed as having obstructive sleep apnea possibly 7 years ago.  She initially started with CPAP therapy, but has not used this in over 5-6 years.  At that time, she never really had instruction or rationale why she needed to use CPAP.  Recently, she has seen by Dr. Etter Sjogren for blood pressure as well as palpitations. A monitor was placed, which she is currently wearing. She notes occasional left sharp discomfort which is not exertionally precipitated. Recent laboratory in December, had demonstrated a low potassium level at 3.3 and she was given a prescription for 20 months of potassium advised to eat potassium-rich foods. Of note, her fasting glucose was 119.  She underwent a 2-D echo Doppler study on 12/30/2015 which revealed normal ejection fraction at  55 -60% with evidence for moderate left ventricular hypertrophy.    When I saw her 4 months ago for evaluation.  I had a long discussion with her concerning the adverse consequences of untreated sleep apnea with reference to cardiovascular health.  At that time, her sleep is poor, she had frequent awakenings, she snored loudly and her sleep is nonrestorative.  I scheduled her for a follow-up sleep study where she was found to have moderate obstructive sleep apnea with an AHI of 19.7 overall.  However, sleep apnea was very severe during rems sleep with an AHI of 99.1 per  hour.  She has significant oxygen desaturation to a nadir of 81% in time below 88% was 3.6 minutes.  She was titrated up to 9 cm water pressure.   Her CPAP set up was 02/22/2016 and she has a ResMed AirSense 10 AutoSet unit which is currently set at 9 cm and has a ResMed Airfit P10 medium size mask.  A download was obtained from a 22 2017 through 05/10/2016.  She is 100% compliant and usage greater than 4 hours per night was 90%.  Her AHI is excellent at 1.3.  She does not have a significant leak.  Since initiating CPAP.  She feels significantly improved.  She has more energy.  There is no snoring.  She states that she "cannot sleep without it."  She denies any nocturnal palpitations.  She presents for evaluation.   Past Medical History  Diagnosis Date  . Allergy   . Hyperlipidemia   . HTN (hypertension) 10/29/2015    Past Surgical History  Procedure Laterality Date  . Abdominal hysterectomy      No Known Allergies  Current Outpatient Prescriptions  Medication Sig Dispense Refill  . albuterol (PROVENTIL HFA;VENTOLIN HFA) 108 (90 Base) MCG/ACT inhaler Inhale 2 puffs into the lungs every 4 (four) hours as needed.    . fluticasone (FLONASE) 50 MCG/ACT nasal spray Place 2 sprays into both nostrils daily. 16 g 11  . hydrochlorothiazide (HYDRODIURIL) 25 MG tablet Take 1 tablet (25 mg total) by mouth daily. [place]  tablet 1  . loratadine (CLARITIN) 10 MG tablet Take 1 tablet (10 mg total) by mouth daily. 30 tablet 11  . montelukast (SINGULAIR) 10 MG tablet Take 1 tablet (10 mg total) by mouth at bedtime. 90 tablet 3  . potassium chloride SA (K-DUR,KLOR-CON) 20 MEQ tablet Take 1 tablet (20 mEq total) by mouth daily. 30 tablet 2   No current facility-administered medications for this visit.    Social History   Social History  . Marital Status: Married    Spouse Name: N/A  . Number of Children: N/A  . Years of Education: N/A   Occupational History  . Not on file.   Social History Main  Topics  . Smoking status: Never Smoker   . Smokeless tobacco: Never Used  . Alcohol Use: No  . Drug Use: No  . Sexual Activity: Yes    Birth Control/ Protection: Surgical   Other Topics Concern  . Not on file   Social History Narrative   Additional social history is notable that she is married for 12 years.  She has 4 children and 7 grandchildren.  She completed 12 th grade of education. She has worked in Energy manager. She never smoked tobacco.  There is no alcohol.  She occasionally walks but does not exercise vigorously.  Family History  Problem Relation Age of Onset  . Heart disease Mother   . Kidney disease Mother   . Hypertension Mother   . Hypertension Father    Additional family history is notable that her mother died with heart problems and at hypertension, father had cancer and hypertension.  A maternal grandmother had sudden death.  She has 4 sisters wall have high blood pressure.  ROS General: Negative; No fevers, chills, or night sweats;  Positive for morbid obesity HEENT: Negative; No changes in vision or hearing, sinus congestion, difficulty swallowing Pulmonary: Negative; No cough, wheezing, shortness of breath, hemoptysis Cardiovascular:  See HPI;  GI: Negative; No nausea, vomiting, diarrhea, or abdominal pain GU: Negative; No dysuria, hematuria, or difficulty voiding Musculoskeletal: Negative; no myalgias, joint pain, or weakness Hematologic/Oncologic: Negative; no easy bruising, bleeding Endocrine: Negative; no heat/cold intolerance; no diabetes Neuro: Negative; no changes in balance, headaches Skin: Negative; No rashes or skin lesions Psychiatric: Negative; No behavioral problems, depression Sleep:  Positive for untreated sleep apnea.  She does snore loudly.No daytime sleepiness, hypersomnolence, bruxism, restless legs, hypnogagnic hallucinations Other comprehensive 14 point system review is negative   Physical Exam BP 121/84 mmHg  Pulse 94  Ht '5\' 2"'$   (1.575 m)  Wt 230 lb 12.8 oz (104.69 kg)  BMI 42.20 kg/m2   Repeat blood pressure by me 118/80.  Wt Readings from Last 3 Encounters:  05/12/16 230 lb 12.8 oz (104.69 kg)  04/05/16 230 lb (104.327 kg)  02/01/16 234 lb 3.2 oz (106.232 kg)   General: Alert, oriented, no distress.  Skin: normal turgor, no rashes, warm and dry HEENT: Normocephalic, atraumatic. Pupils equal round and reactive to light; sclera anicteric; extraocular muscles intact; Fundi without hemorrhages or exudates.  Disks flat Nose without nasal septal hypertrophy Mouth/Parynx benign; Mallinpatti scale 4 Neck: No JVD, no carotid bruits; normal carotid upstroke Lungs: clear to ausculatation and percussion; no wheezing or rales Chest wall: without tenderness to palpitation Heart: PMI not displaced, RRR, s1 s2 normal, 1/6 systolic murmur, no diastolic murmur, no rubs, gallops, thrills, or heaves Abdomen:  Significant central adiposity; soft, nontender; no hepatosplenomehaly, BS+; abdominal aorta nontender and not dilated by palpation. Back: no CVA  tenderness Pulses 2+ Musculoskeletal: full range of motion, normal strength, no joint deformities Extremities: no clubbing cyanosis or edema, Homan's sign negative  Neurologic: grossly nonfocal; Cranial nerves grossly wnl Psychologic: Normal mood and affect   February 2017 ECG (independently read by me):  Normal sinus rhythm at 96 bpm. Inferior T-wave abnormality in leads 3 and aVF and non- specific change in V6.  An Epworth Sleepiness Scale score was endorsed today at 5 arguing against excessive daytime sleepiness.  Her highest chance of dozing is when she is lying down to rest in the afternoon when circumstances persist.  There is only a slight chance of dozing while sitting and reading or while watching television.  LABS:  BMP Latest Ref Rng 05/03/2016 01/13/2016 11/17/2015  Glucose 70 - 99 mg/dL 92 83 119(H)  BUN 6 - 23 mg/dL '12 13 10  '$ Creatinine 0.40 - 1.20 mg/dL 0.68  0.71 0.64  Sodium 135 - 145 mEq/L 136 140 138  Potassium 3.5 - 5.1 mEq/L 3.3(L) 3.9 3.3(L)  Chloride 96 - 112 mEq/L 102 101 102  CO2 19 - 32 mEq/L '26 27 29  '$ Calcium 8.4 - 10.5 mg/dL 9.3 9.6 9.7     Hepatic Function Latest Ref Rng 05/03/2016 01/13/2016 11/17/2015  Total Protein 6.0 - 8.3 g/dL 7.1 7.2 7.3  Albumin 3.5 - 5.2 g/dL 4.0 3.9 4.1  AST 0 - 37 U/L '20 30 23  '$ ALT 0 - 35 U/L 27 38(H) 29  Alk Phosphatase 39 - 117 U/L 66 71 82  Total Bilirubin 0.2 - 1.2 mg/dL 0.3 0.4 0.3    CBC Latest Ref Rng 01/13/2016 11/17/2015 10/29/2015  WBC 4.0 - 10.5 K/uL 5.7 6.0 6.4  Hemoglobin 12.0 - 15.0 g/dL 12.9 13.2 13.8  Hematocrit 36.0 - 46.0 % 38.2 39.3 40.8  Platelets 150 - 400 K/uL 276 232.0 291.0   Lab Results  Component Value Date   MCV 89.5 01/13/2016   MCV 89.7 11/17/2015   MCV 89.6 10/29/2015   Lab Results  Component Value Date   TSH 1.200 11/17/2015   Lab Results  Component Value Date   HGBA1C 5.8 05/03/2016     BNP No results found for: BNP  ProBNP No results found for: PROBNP   Lipid Panel     Component Value Date/Time   CHOL 208* 05/03/2016 0759   TRIG 121.0 05/03/2016 0759   HDL 53.30 05/03/2016 0759   CHOLHDL 4 05/03/2016 0759   VLDL 24.2 05/03/2016 0759   LDLCALC 130* 05/03/2016 0759    RADIOLOGY: No results found.   ASSESSMENT AND PLAN:  Dana Patterson is a 40 year old female who is morbidly obese and has a history of hypertension for which recently she has been maintained on hydrochlorothiazide 25 mg daily.  She recently was found to be hypokalemic with a potassium of 3.3, and has been started on supplemental potassium 20 lymphs daily.  She had experienced episodic palpitations but denies any associated presyncope or syncope.  Due to concerns for significant obstructive sleep apnea.  She underwent a sleep study which was a split-night protocol.  This confirmed moderate sleep apnea but sleep apnea was very severe during in REM sleep.  I spent  significant time with her.  Reviewed her sleep study in detail.  Since using CPAP.  She is very appreciative of the time today and spent discussing the importance of use of CPAP therapy particularly as it pertains to cardiovascular ability.  Her blood pressure today is stable.  She is not having  any nocturnal palpitations.  She is more alert.  I reviewed her download in detail.  She is compliant and is obtaining an excellent benefit with therapy.  She is sleeping on average only 6 hours and 9 minutes.  I have suggested increase sleep duration.  In particular, an adult should sleep between 7-8 hours per night.  She is unaware of any breakthrough snoring.  We discussed the importance of weight loss with her morbid obesity and BMI of 42.2.  I will see her in one year for cardiology/sleep reevaluation.  Time spent: 25 minutes Troy Sine, MD, [place] 05/14/2016 1:51 PM

## 2016-07-29 ENCOUNTER — Other Ambulatory Visit: Payer: Self-pay | Admitting: Family Medicine

## 2016-07-29 DIAGNOSIS — I10 Essential (primary) hypertension: Secondary | ICD-10-CM

## 2016-08-12 IMAGING — US US EXTREM LOW VENOUS*L*
1 series · 13 of 24 positions shown · non-contrast
Comparison: None.

CLINICAL DATA: Left lower extremity edema.  Evaluate for DVT.



[Series 1: us extrem low venous*left* · 0.09mm/px · 13 of 33 slices shown]
[im 1/33]
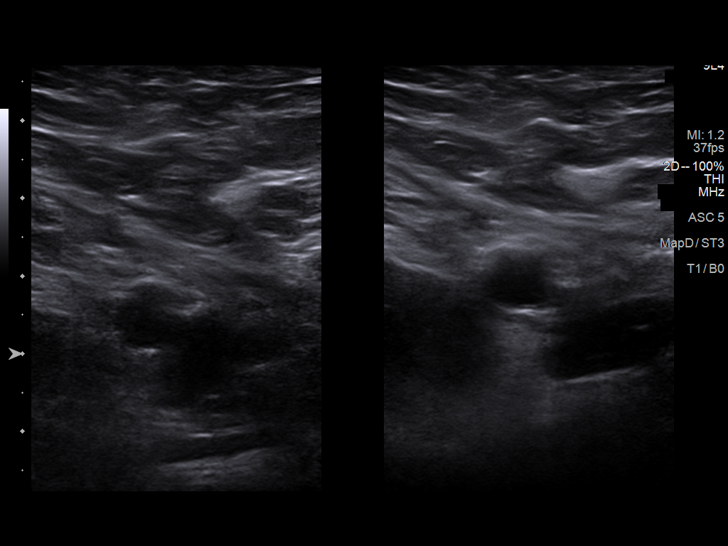
[im 3/33]
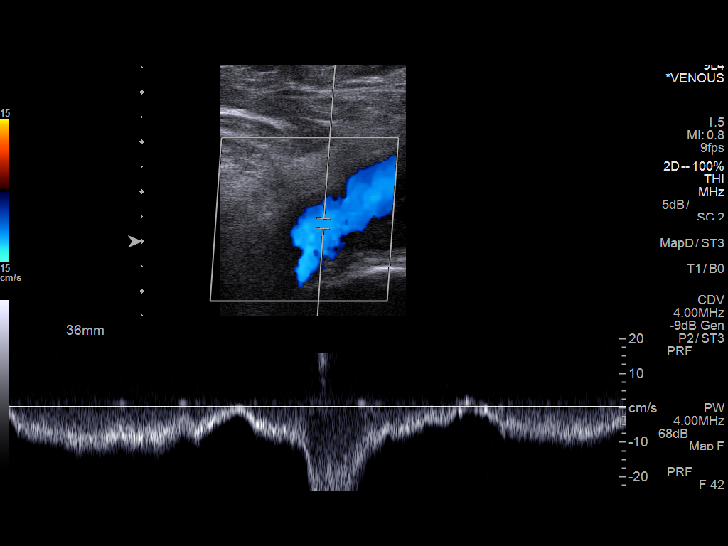
[im 6/33]
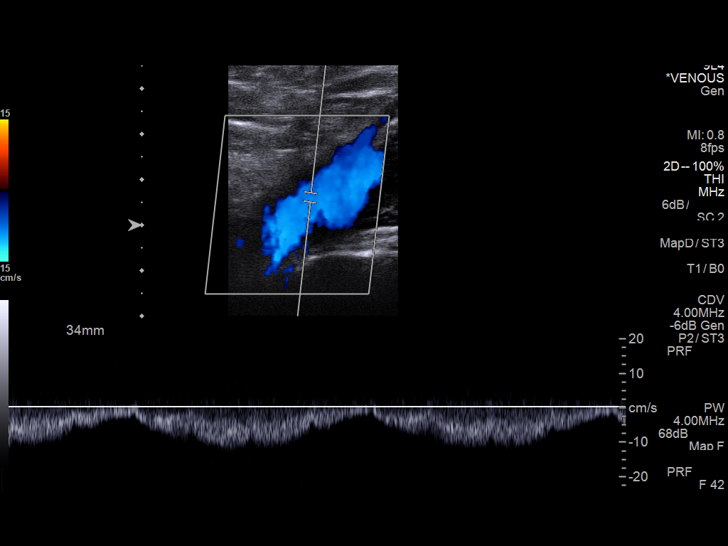
[im 9/33]
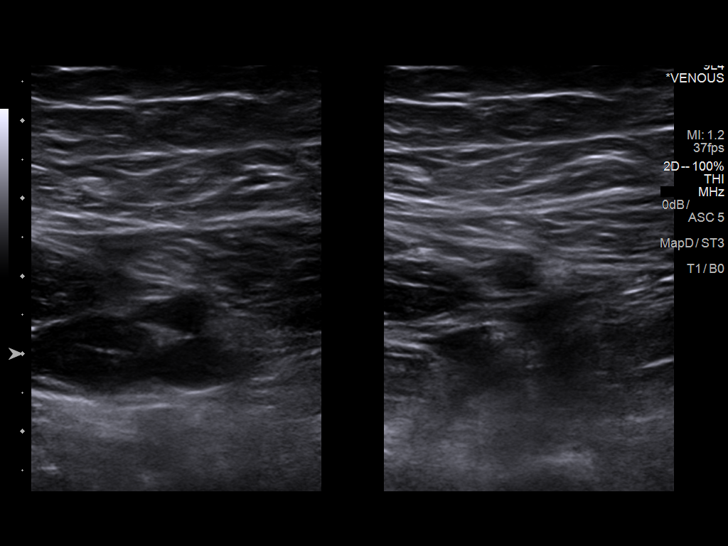
[im 12/33]
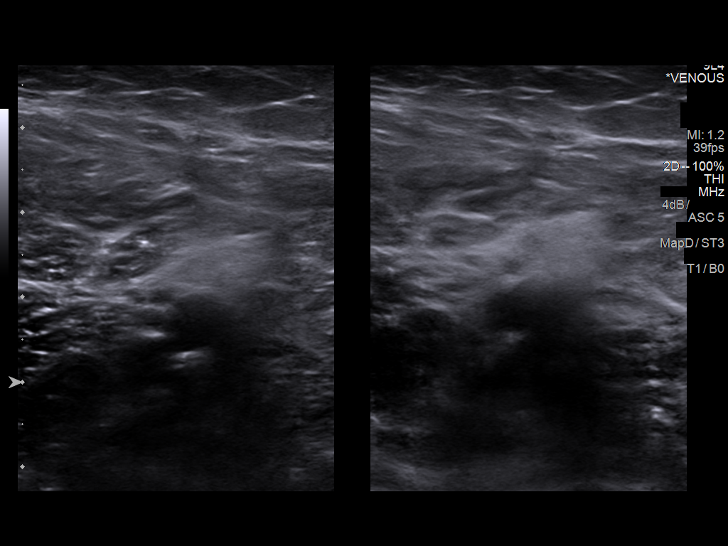
[im 14/33]
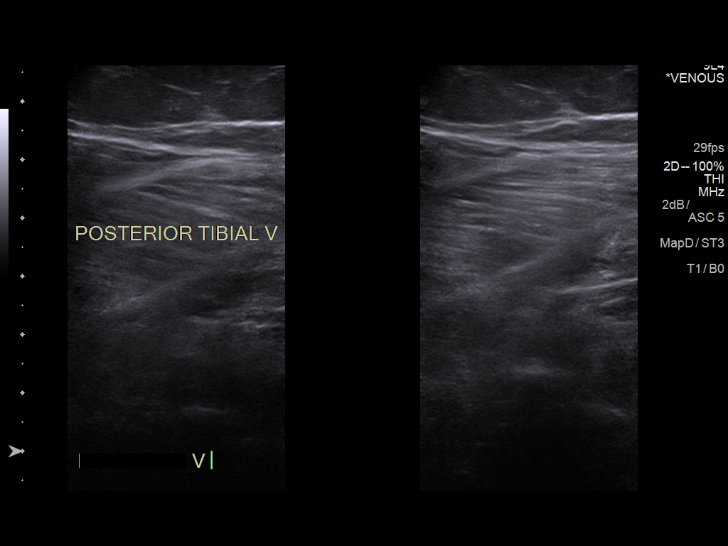
[im 17/33]
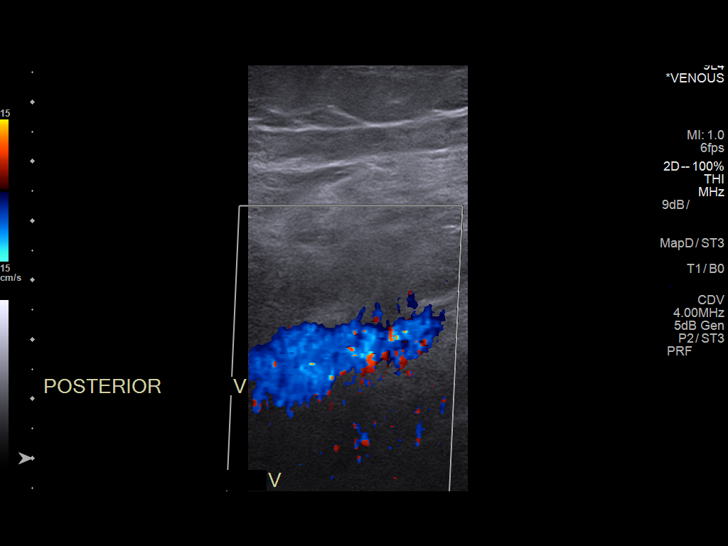
[im 19/33]
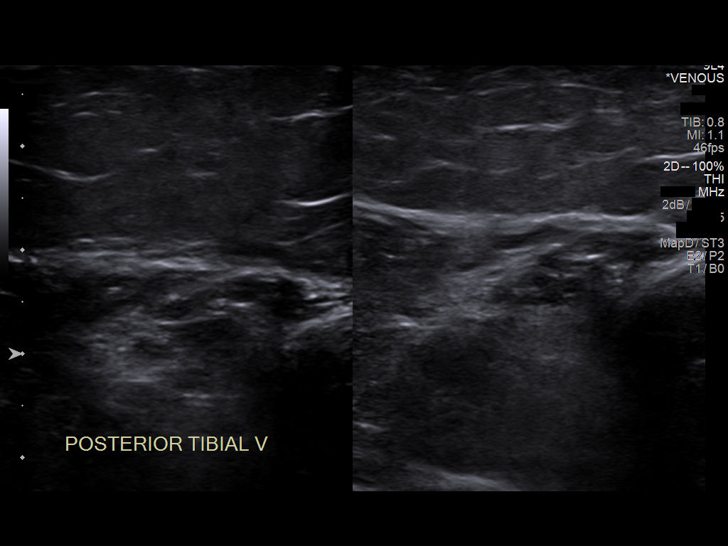
[im 21/33]
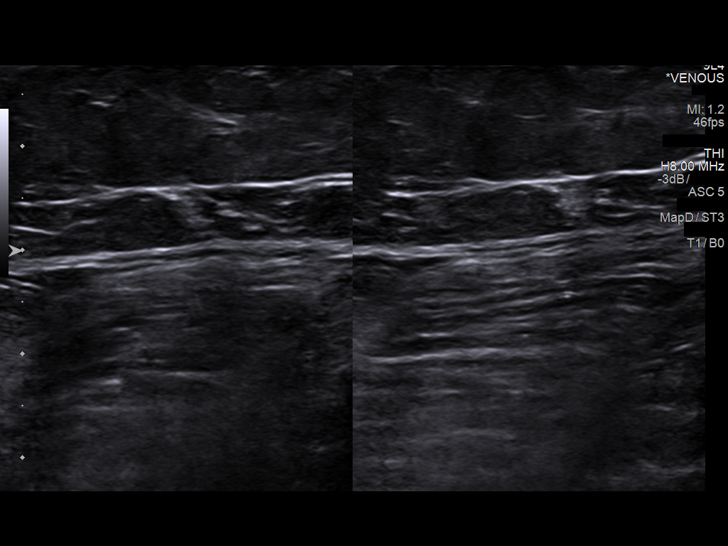
[im 24/33]
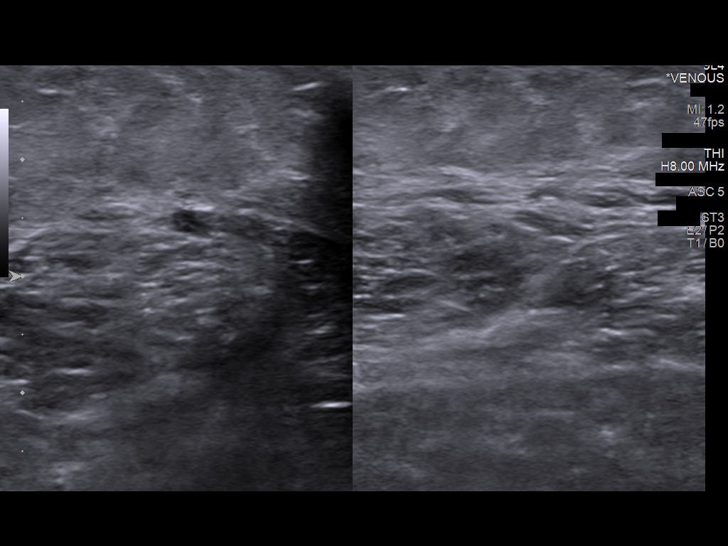
[im 27/33]
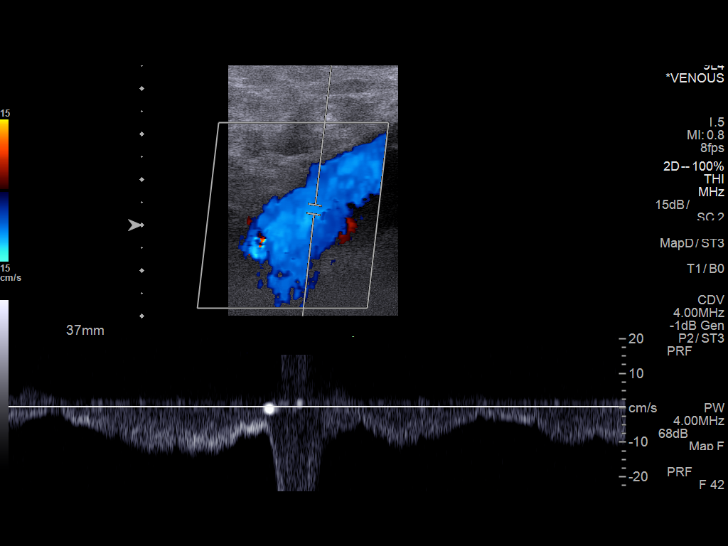
[im 30/33]
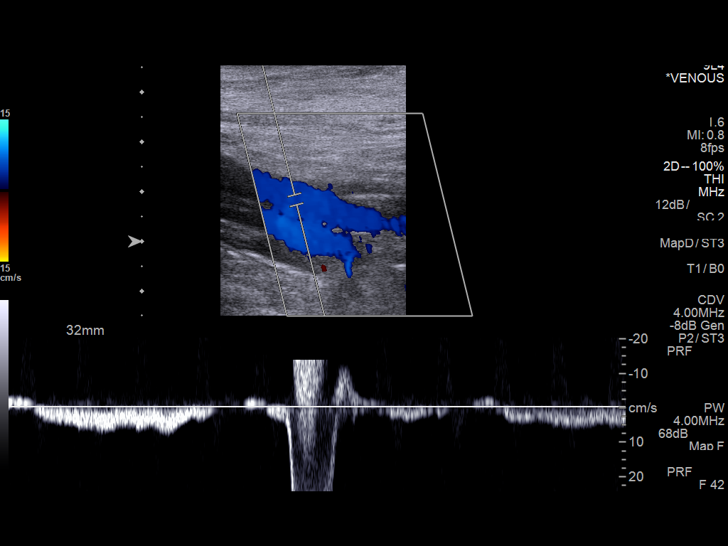
[im 33/33]
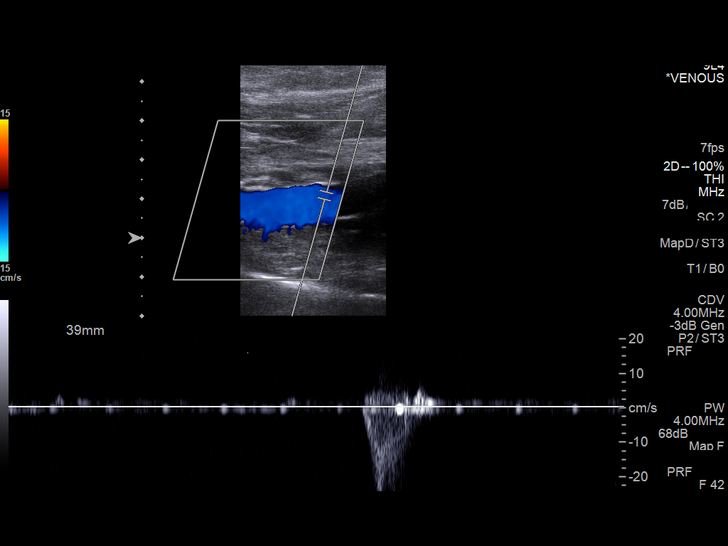

[13 of 24 positions shown; findings below may reference images not displayed]

FINDINGS: Contralateral Common Femoral Vein: Respiratory phasicity is normal
and symmetric with the symptomatic side. No evidence of thrombus.
Normal compressibility.

Common Femoral Vein: No evidence of thrombus. Normal
compressibility, respiratory phasicity and response to augmentation.

Saphenofemoral Junction: No evidence of thrombus. Normal
compressibility and flow on color Doppler imaging.

Profunda Femoral Vein: No evidence of thrombus. Normal
compressibility and flow on color Doppler imaging.

Femoral Vein: No evidence of thrombus. Normal compressibility,
respiratory phasicity and response to augmentation.

Popliteal Vein: No evidence of thrombus. Normal compressibility,
respiratory phasicity and response to augmentation.

Calf Veins: No evidence of thrombus. Normal compressibility and flow
on color Doppler imaging.

Superficial Great Saphenous Vein: No evidence of thrombus. Normal
compressibility.

Venous Reflux:  None.

Other Findings:  None.
IMPRESSION: No evidence of DVT within the left lower extremity.

## 2016-08-15 IMAGING — US US TRANSVAGINAL NON-OB
1 series · 14 of 25 positions shown · non-contrast
Comparison: Ultrasound [DATE].  CT [DATE].

CLINICAL DATA: Hemorrhagic ovarian cyst on CT

EXAM:
ULTRASOUND PELVIS TRANSVAGINAL
TECHNIQUE: Transvaginal ultrasound examination of the pelvis was performed
including evaluation of the uterus, ovaries, adnexal regions, and
pelvic cul-de-sac.

[Series 1: us transvaginal non-ob · 0.11mm/px · 14 of 49 slices shown]
[im 1/49]
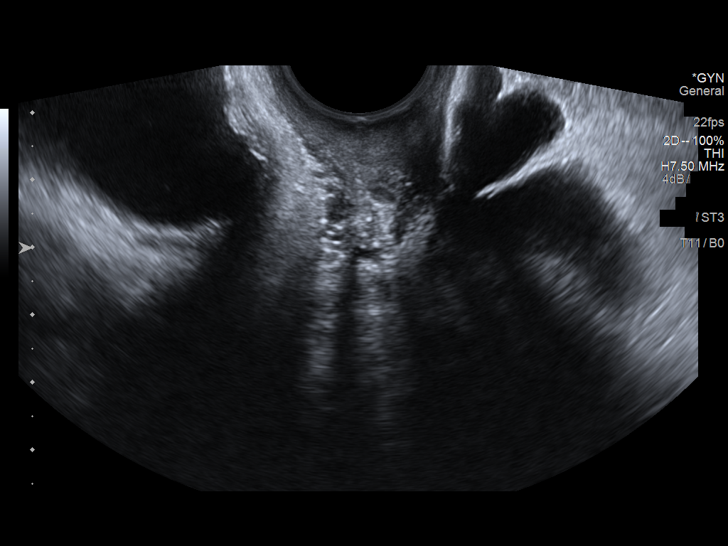
[im 5/49]
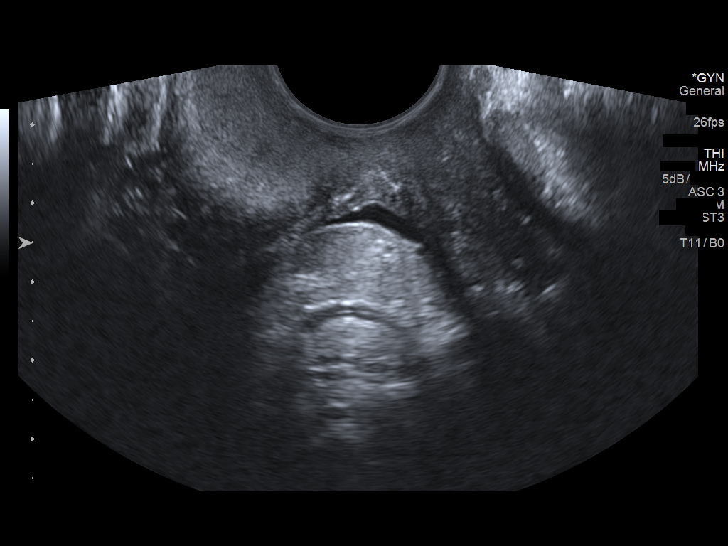
[im 9/49]
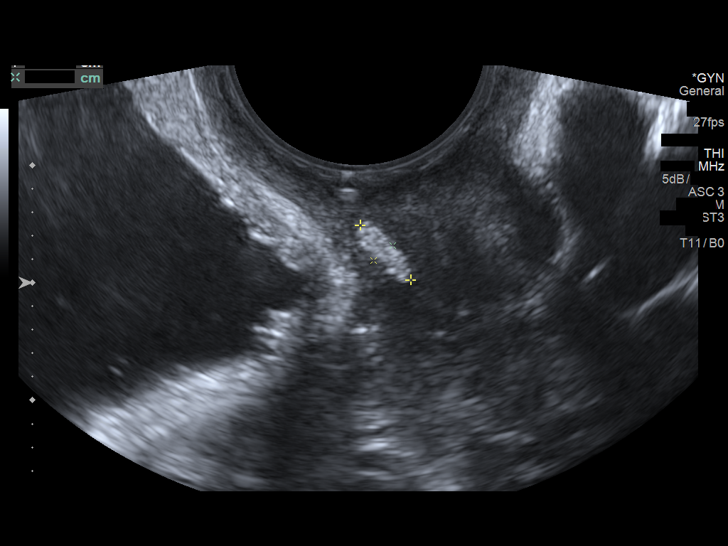
[im 13/49]
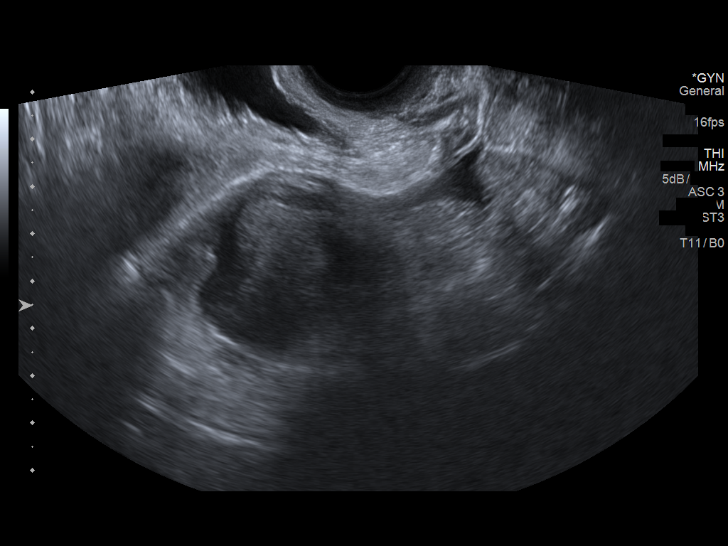
[im 17/49]
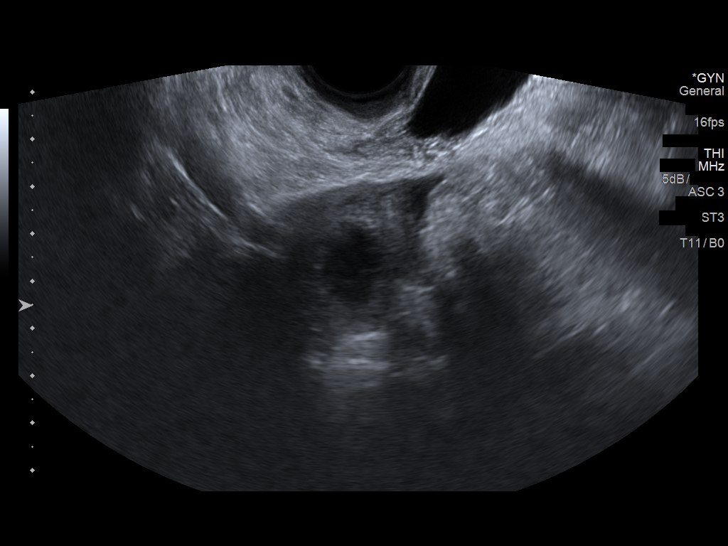
[im 19/49]
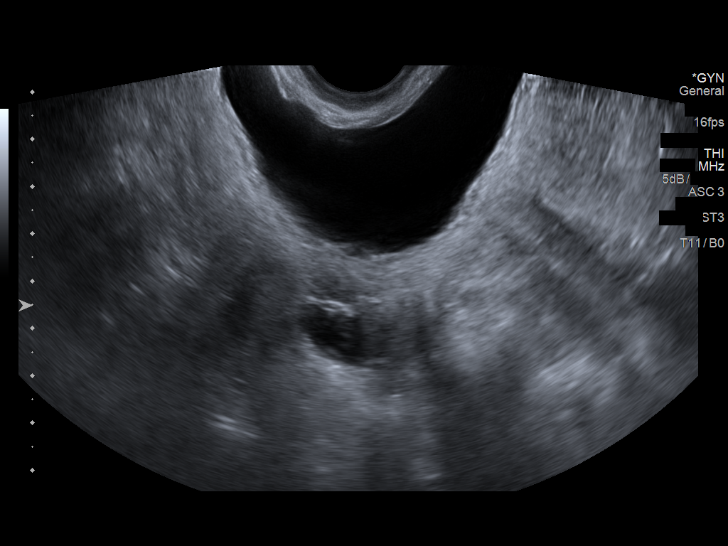
[im 23/49]
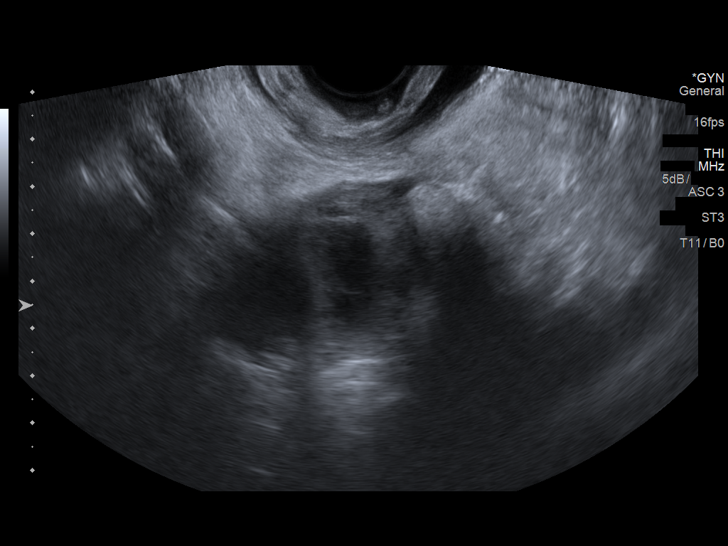
[im 27/49]
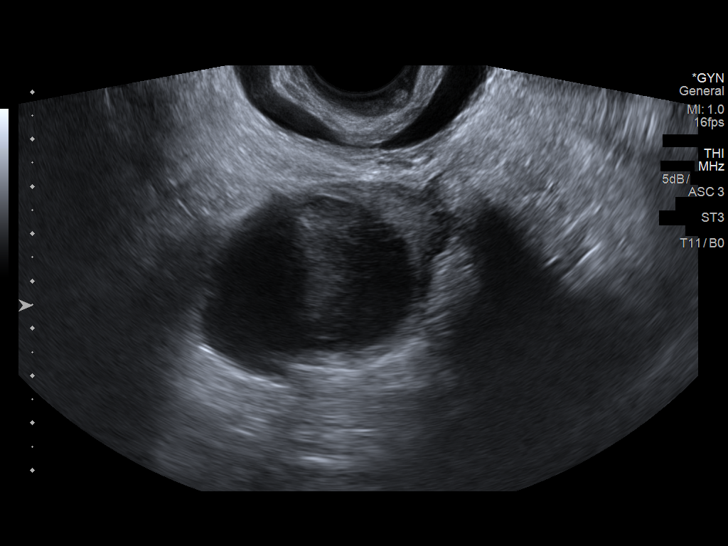
[im 31/49]
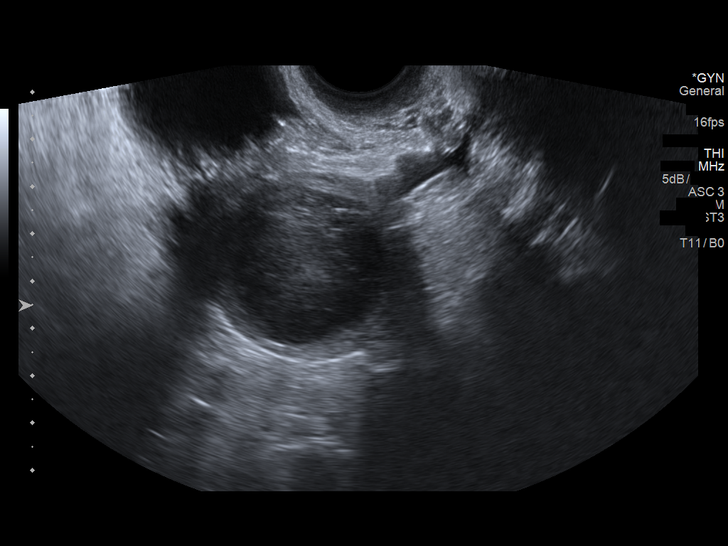
[im 33/49]
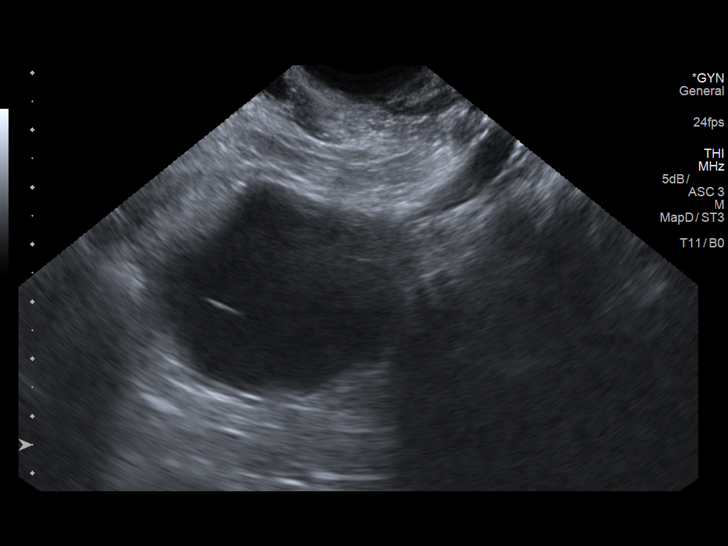
[im 37/49]
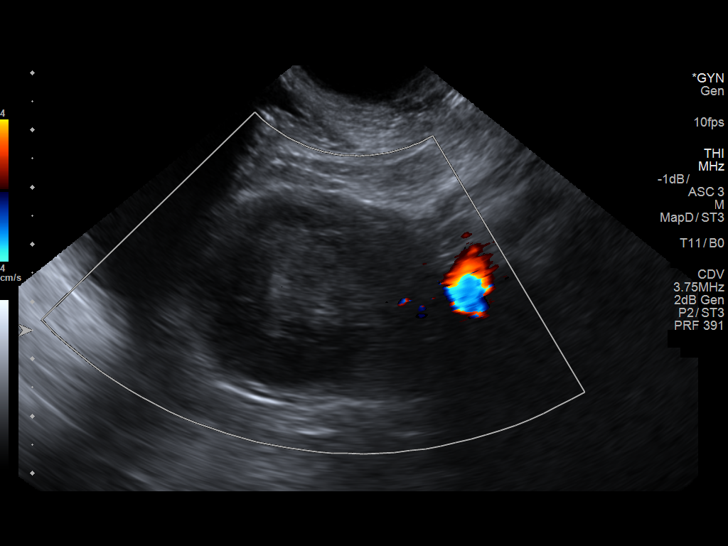
[im 41/49]
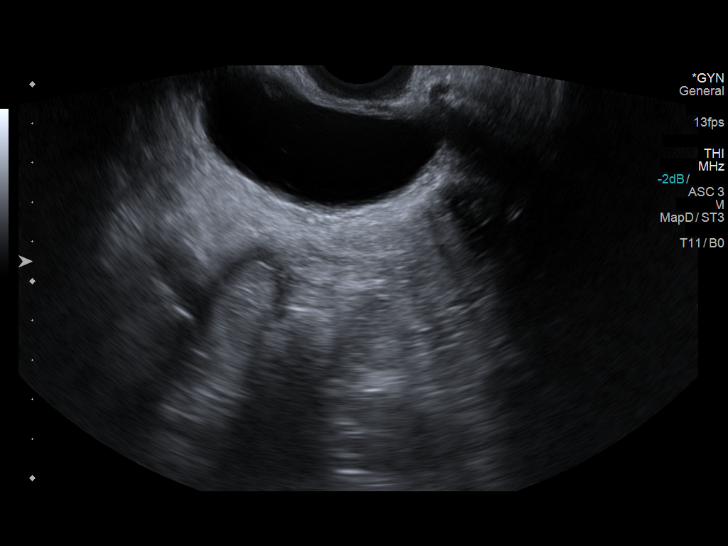
[im 45/49]
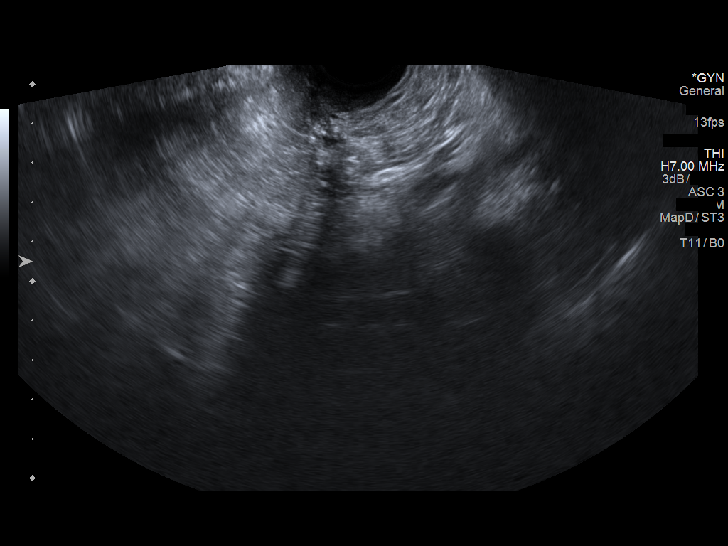
[im 49/49]
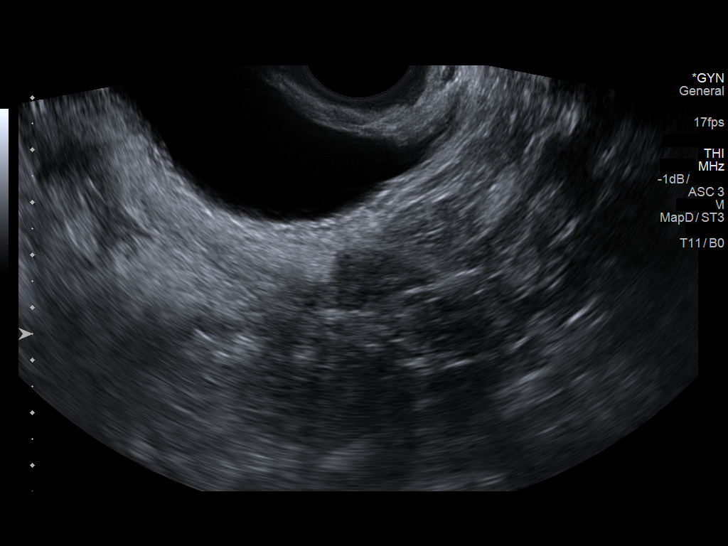

[14 of 25 positions shown; findings below may reference images not displayed]

FINDINGS: Uterus

Measurements: Prior hysterectomy.

Endometrium

Thickness: N/A.

Right ovary

Measurements: 4.5 x 4.0 x 5.8 cm. Complex cystic lesion measures
x 4.1 x 3.6 cm with internal echoes, most compatible with
hemorrhagic cyst.

Left ovary

Measurements: Not visualized.  No adnexal mass seen.

Other findings:  No abnormal free fluid
IMPRESSION: 4.7 cm complex cyst in the right ovary, likely hemorrhagic cyst.
This could be followed with repeat ultrasound in 3-6 months to
ensure resolution.

## 2016-11-24 ENCOUNTER — Emergency Department (HOSPITAL_BASED_OUTPATIENT_CLINIC_OR_DEPARTMENT_OTHER): Payer: BLUE CROSS/BLUE SHIELD

## 2016-11-24 ENCOUNTER — Encounter (HOSPITAL_BASED_OUTPATIENT_CLINIC_OR_DEPARTMENT_OTHER): Payer: Self-pay | Admitting: *Deleted

## 2016-11-24 ENCOUNTER — Emergency Department (HOSPITAL_BASED_OUTPATIENT_CLINIC_OR_DEPARTMENT_OTHER)
Admission: EM | Admit: 2016-11-24 | Discharge: 2016-11-24 | Disposition: A | Discharge status: Home / Self Care | Payer: BLUE CROSS/BLUE SHIELD | Attending: Emergency Medicine | Admitting: Emergency Medicine

## 2016-11-24 DIAGNOSIS — Z79899 Other long term (current) drug therapy: Secondary | ICD-10-CM | POA: Insufficient documentation

## 2016-11-24 DIAGNOSIS — R1011 Right upper quadrant pain: Secondary | ICD-10-CM | POA: Insufficient documentation

## 2016-11-24 DIAGNOSIS — R109 Unspecified abdominal pain: Secondary | ICD-10-CM

## 2016-11-24 LAB — CBC WITH DIFFERENTIAL/PLATELET
Basophils Absolute: 0.1 10*3/uL (ref 0.0–0.1)
Basophils Relative: 1 %
EOS PCT: 4 %
Eosinophils Absolute: 0.3 10*3/uL (ref 0.0–0.7)
HEMATOCRIT: 37.8 % (ref 36.0–46.0)
Hemoglobin: 12.8 g/dL (ref 12.0–15.0)
LYMPHS ABS: 3.8 10*3/uL (ref 0.7–4.0)
LYMPHS PCT: 46 %
MCH: 30.3 pg (ref 26.0–34.0)
MCHC: 33.9 g/dL (ref 30.0–36.0)
MCV: 89.4 fL (ref 78.0–100.0)
MONO ABS: 0.6 10*3/uL (ref 0.1–1.0)
Monocytes Relative: 7 %
NEUTROS ABS: 3.4 10*3/uL (ref 1.7–7.7)
Neutrophils Relative %: 42 %
PLATELETS: 263 10*3/uL (ref 150–400)
RBC: 4.23 MIL/uL (ref 3.87–5.11)
RDW: 12.9 % (ref 11.5–15.5)
WBC: 8.2 10*3/uL (ref 4.0–10.5)

## 2016-11-24 LAB — URINALYSIS, ROUTINE W REFLEX MICROSCOPIC
Bilirubin Urine: NEGATIVE
Glucose, UA: NEGATIVE mg/dL
Hgb urine dipstick: NEGATIVE
KETONES UR: NEGATIVE mg/dL
LEUKOCYTES UA: NEGATIVE
NITRITE: NEGATIVE
PH: 5 (ref 5.0–8.0)
PROTEIN: NEGATIVE mg/dL
Specific Gravity, Urine: 1.02 (ref 1.005–1.030)

## 2016-11-24 LAB — COMPREHENSIVE METABOLIC PANEL
ALBUMIN: 4.3 g/dL (ref 3.5–5.0)
ALT: 28 U/L (ref 14–54)
AST: 23 U/L (ref 15–41)
Alkaline Phosphatase: 70 U/L (ref 38–126)
Anion gap: 7 (ref 5–15)
BUN: 11 mg/dL (ref 6–20)
CO2: 28 mmol/L (ref 22–32)
Calcium: 9.6 mg/dL (ref 8.9–10.3)
Chloride: 101 mmol/L (ref 101–111)
Creatinine, Ser: 0.7 mg/dL (ref 0.44–1.00)
GFR calc Af Amer: >60 mL/min (ref >60)
GFR calc non Af Amer: >60 mL/min (ref >60)
GLUCOSE: 98 mg/dL (ref 65–99)
POTASSIUM: 4 mmol/L (ref 3.5–5.1)
SODIUM: 136 mmol/L (ref 135–145)
Total Bilirubin: 0.3 mg/dL (ref 0.3–1.2)
Total Protein: 7.8 g/dL (ref 6.5–8.1)

## 2016-11-24 LAB — LIPASE, BLOOD: Lipase: 31 U/L (ref 11–51)

## 2016-11-24 LAB — PREGNANCY, URINE: PREG TEST UR: NEGATIVE

## 2016-11-24 IMAGING — CT CT RENAL STONE PROTOCOL
2 of 4 series · 17 of 46 positions shown, 19 images · non-contrast
Comparison: Ultrasound [DATE]

CLINICAL DATA: Right flank pain and right upper quadrant abdominal
pain tonight.

EXAM:
CT ABDOMEN AND PELVIS WITHOUT CONTRAST
TECHNIQUE: Multidetector CT imaging of the abdomen and pelvis was performed
following the standard protocol without IV contrast.

[Series 2: axial st · axial · 0.98mm/px · z∈[-393,+37]mm · 14 of 94 slices shown, 16 images]
[im 4/94  soft-tissue]
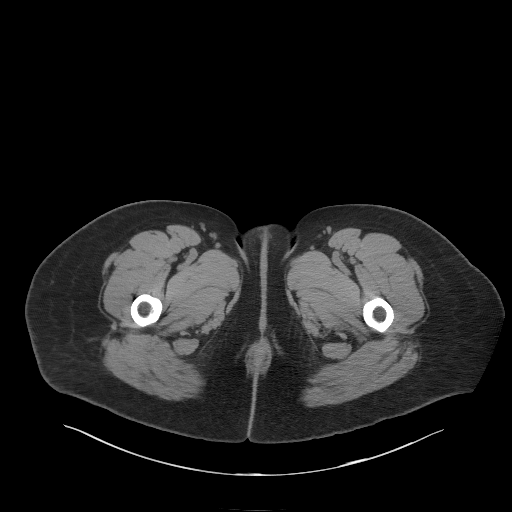
[im 4/94  bone]
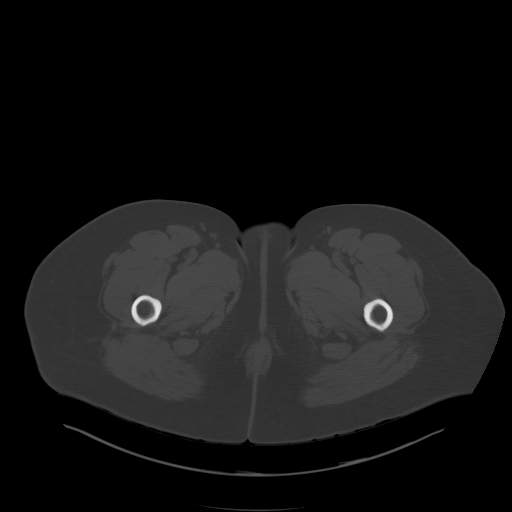
[im 12/94  soft-tissue]
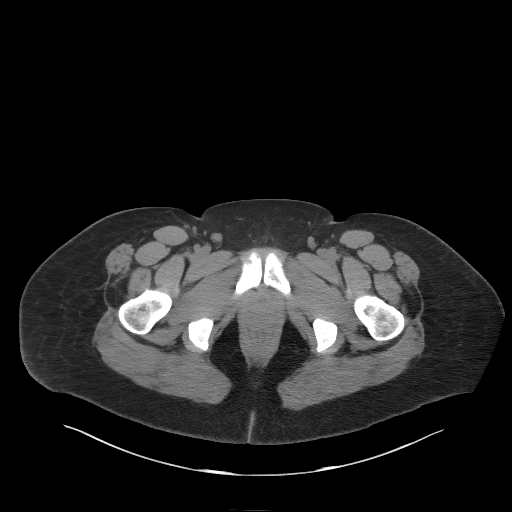
[im 20/94  soft-tissue]
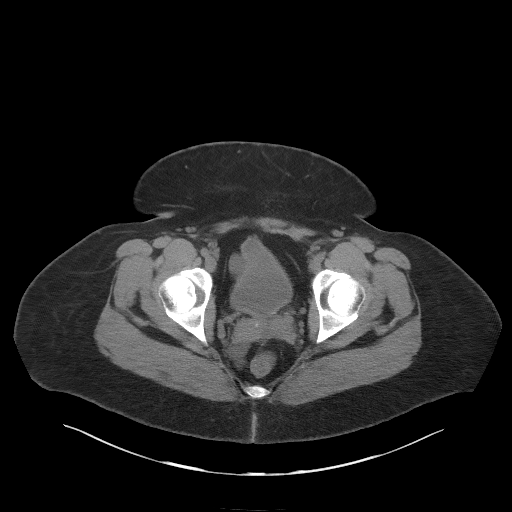
[im 24/94  soft-tissue]
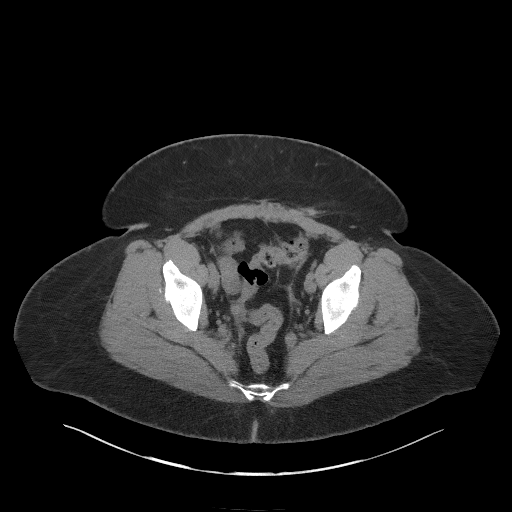
[im 32/94  soft-tissue]
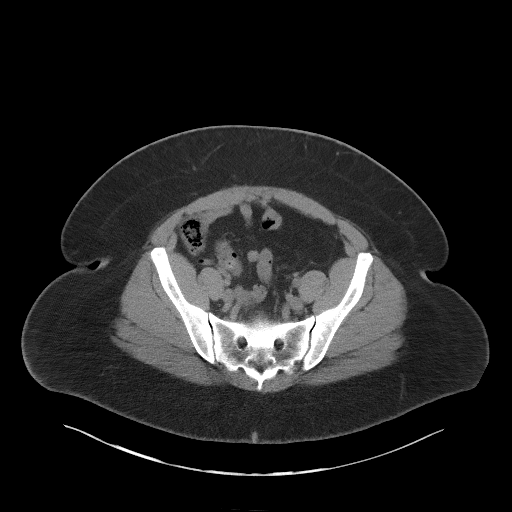
[im 39/94  soft-tissue]
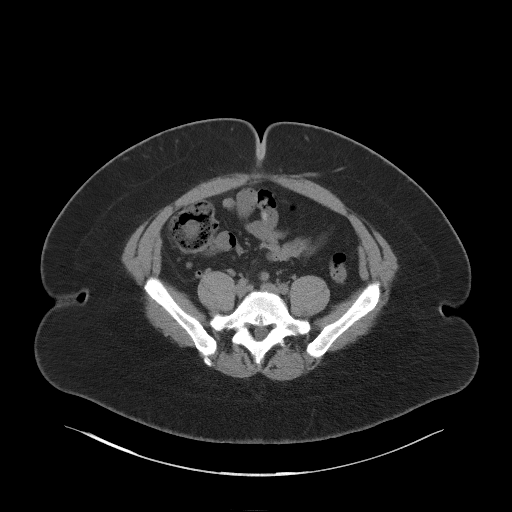
[im 43/94  soft-tissue]
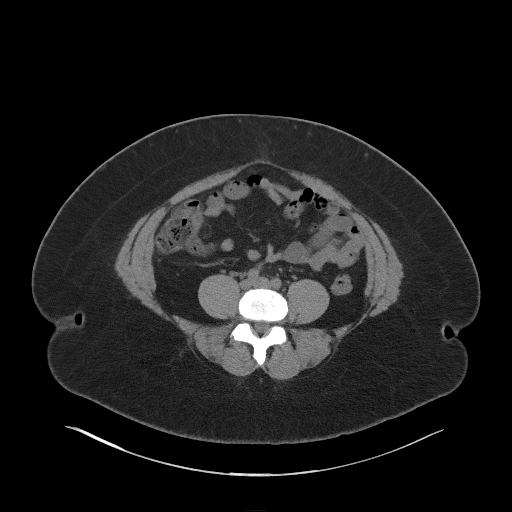
[im 51/94  soft-tissue]
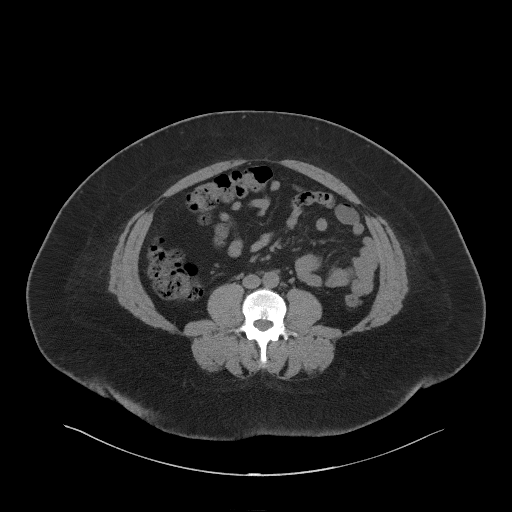
[im 55/94  soft-tissue]
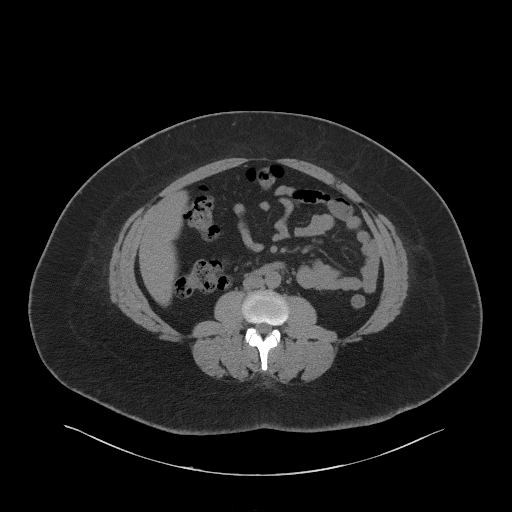
[im 55/94  bone]
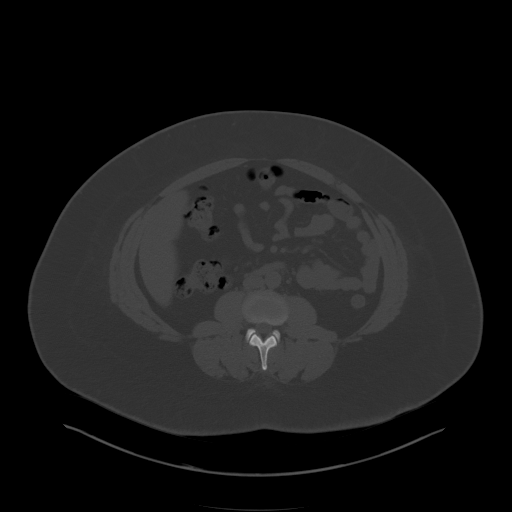
[im 63/94  soft-tissue]
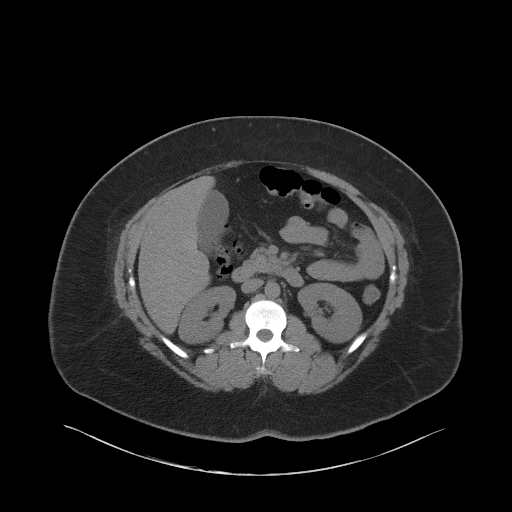
[im 70/94  soft-tissue]
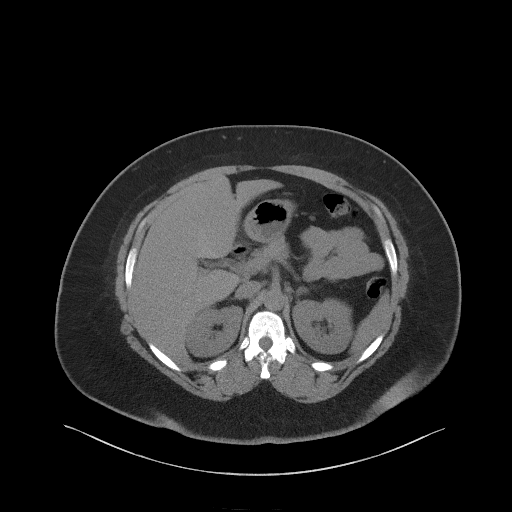
[im 74/94  soft-tissue]
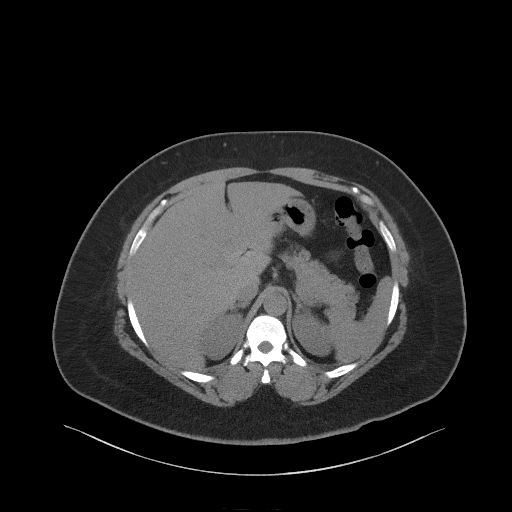
[im 82/94  soft-tissue]
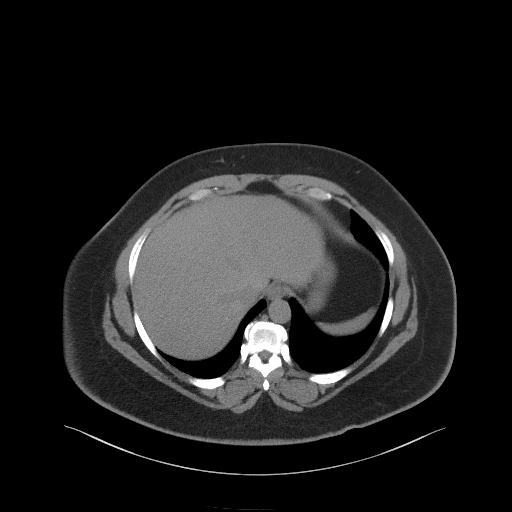
[im 90/94  soft-tissue]
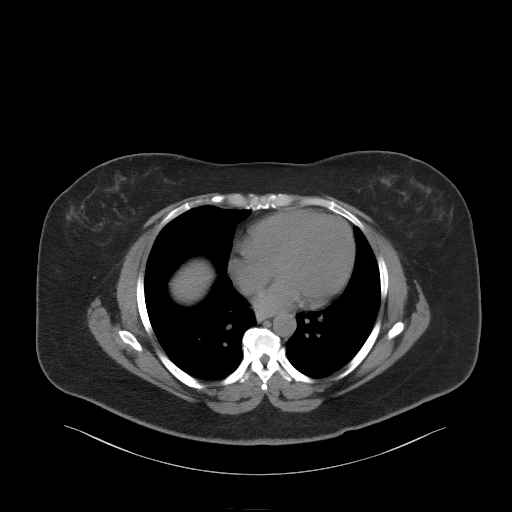

[Series 4: coronal st · coronal · 0.92mm/px · 3 of 93 slices shown]
[im 31/93  soft-tissue]
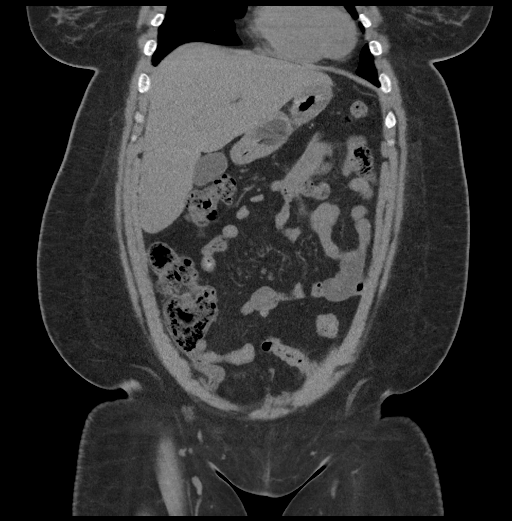
[im 41/93  soft-tissue]
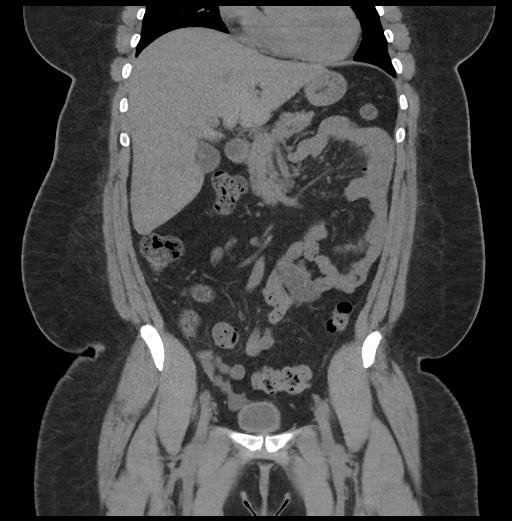
[im 52/93  soft-tissue]
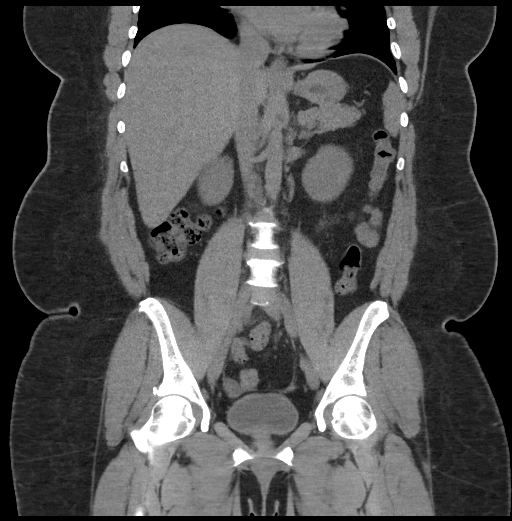

[17 of 46 positions shown; findings below may reference images not displayed]

FINDINGS: Lower chest: No acute abnormality.

Hepatobiliary: No focal liver abnormality is seen. No gallstones,
gallbladder wall thickening, or biliary dilatation.

Pancreas: Unremarkable. No pancreatic ductal dilatation or
surrounding inflammatory changes.

Spleen: Normal in size without focal abnormality.

Adrenals/Urinary Tract: Adrenal glands are unremarkable. Kidneys are
normal, without renal calculi, focal lesion, or hydronephrosis.
Bladder is unremarkable.

Stomach/Bowel: Stomach is within normal limits. Appendix is normal.
No evidence of bowel wall thickening, distention, or inflammatory
changes.

Vascular/Lymphatic: No significant vascular findings are present. No
enlarged abdominal or pelvic lymph nodes.

Reproductive: Status post hysterectomy. No adnexal masses.

Other: No inflammation.  No ascites.

Musculoskeletal: No significant skeletal lesions.
IMPRESSION: No significant abnormality

## 2016-11-24 MED ORDER — ONDANSETRON HCL 4 MG/2ML IJ SOLN: 4.0000 mg | Freq: Once | INTRAMUSCULAR | Status: DC

## 2016-11-24 MED ORDER — MORPHINE SULFATE (PF) 4 MG/ML IV SOLN
4.0000 mg | Freq: Once | INTRAVENOUS | Status: AC
Start: 2016-11-24 — End: 2016-11-24
  Administered 2016-11-24: 4 mg via INTRAVENOUS
  Filled 2016-11-24: qty 1

## 2016-11-24 MED ORDER — HYDROCODONE-ACETAMINOPHEN 5-325 MG PO TABS: 1.0000 | ORAL_TABLET | Freq: Four times a day (QID) | ORAL | 0 refills | Status: DC | PRN

## 2016-11-24 MED ORDER — ONDANSETRON HCL 4 MG/2ML IJ SOLN
4.0000 mg | Freq: Once | INTRAMUSCULAR | Status: AC
  Administered 2016-11-24: 4 mg via INTRAVENOUS
  Filled 2016-11-24: qty 2

## 2016-11-24 NOTE — ED Triage Notes (Signed)
Patient is alert and oriented x4.  She is complaining of right side back pain that radiates to URQ.  Patient denies any Hx of this issue.  Patient denies any change in pain with eating.  Patient denies having this issue before.  Currently she rates her pain 8 of 10.

## 2016-11-24 NOTE — ED Provider Notes (Signed)
[facility] Note   CSN: [id number] Arrival date & time: 11/24/16  1957   By signing my name below, I, Cynda AcresHailei Fulton, attest that this documentation has been prepared under the direction and in the presence of Geoffery Lyonsouglas Tandrea Kommer, MD. Electronically Signed: Cynda AcresHailei Fulton, Scribe. 11/24/16. 9:25 PM.   History   Chief Complaint Chief Complaint  Patient presents with  . Back Pain    HPI Comments: Dana Patterson is a 41 y.o. female with a PMHx of HLD and HTN, who presents to the Emergency Department complaining of sudden-onset, constant right-sided back pain that radiates to the epigastric region. Episode began began earlier today around 2PM while sitting. She has an associated headache. Patient states the pain is worse when breathing. She describes the severity of her pain as an 8 out of 10. No modifying factors indicating. She denies any constipation, diarrhea, or cough.    The history is provided by the patient. No language interpreter was used.  Abdominal Pain   This is a new problem. The current episode started 6 to 12 hours ago. The problem occurs constantly. The problem has not changed since onset.The pain is associated with an unknown factor. The pain is located in the epigastric region. The pain is at a severity of 8/10. The pain is moderate. Pertinent negatives include diarrhea and constipation. Nothing relieves the symptoms.    Past Medical History:  Diagnosis Date  . Allergy   . HTN (hypertension) 10/29/2015  . Hyperlipidemia     Patient Active Problem List   Diagnosis Date Noted  . Morbid obesity (HCC) 01/14/2016  . Hypokalemia 01/14/2016  . Obstructive sleep apnea 01/14/2016  . Palpitations 01/14/2016  . Metabolic syndrome 01/14/2016  . Left hip pain 12/09/2015  . HTN (hypertension) 10/29/2015  . Allergic rhinitis 10/29/2015  . Pain in joint, shoulder region 10/29/2015  . Neck pain 10/29/2015    Past Surgical History:  Procedure Laterality Date    . ABDOMINAL HYSTERECTOMY      OB History    No data available       Home Medications    Prior to Admission medications   Medication Sig Start Date End Date Taking? Authorizing Provider  albuterol (PROVENTIL HFA;VENTOLIN HFA) 108 (90 Base) MCG/ACT inhaler Inhale 2 puffs into the lungs every 4 (four) hours as needed. 11/23/15 11/22/16  Historical Provider, MD  fluticasone (FLONASE) 50 MCG/ACT nasal spray Place 2 sprays into both nostrils daily. 10/29/15   Ramon DredgeEdward Saguier, PA-C  hydrochlorothiazide (HYDRODIURIL) 25 MG tablet TAKE 1 TABLET (25 MG TOTAL) BY MOUTH DAILY. 07/29/16   Lelon PerlaYvonne R Lowne Chase, DO  loratadine (CLARITIN) 10 MG tablet Take 1 tablet (10 mg total) by mouth daily. 10/29/15   Ramon DredgeEdward Saguier, PA-C  montelukast (SINGULAIR) 10 MG tablet Take 1 tablet (10 mg total) by mouth at bedtime. 12/21/15   Ramon DredgeEdward Saguier, PA-C  potassium chloride SA (K-DUR,KLOR-CON) 20 MEQ tablet Take 1 tablet (20 mEq total) by mouth daily. 11/24/15   Donato SchultzYvonne R Lowne Chase, DO    Family History Family History  Problem Relation Age of Onset  . Heart disease Mother   . Kidney disease Mother   . Hypertension Mother   . Hypertension Father     Social History Social History  Substance Use Topics  . Smoking status: Never Smoker  . Smokeless tobacco: Never Used  . Alcohol use No     Allergies   Patient has no known allergies.   Review of Systems Review of Systems  Gastrointestinal: Positive for abdominal pain. Negative for constipation and diarrhea.  Musculoskeletal: Positive for back pain.     Physical Exam Updated Vital Signs BP 138/89 (BP Location: Left Arm)   Pulse 79   Temp 98.2 F (36.8 C) (Oral)   Resp 18   Ht 5\' 2"  (1.575 m)   Wt 225 lb (102.1 kg)   SpO2 100%   BMI 41.15 kg/m   Physical Exam  Constitutional: She is oriented to person, place, and time. She appears well-developed and well-nourished. No distress.  HENT:  Head: Normocephalic and atraumatic.  Eyes: EOM are normal.   Neck: Normal range of motion.  Cardiovascular: Normal rate, regular rhythm and normal heart sounds.   Pulmonary/Chest: Effort normal and breath sounds normal. Tenderness: TTP of the right upper quadrant and rightflank   Abdominal: Soft. She exhibits no distension. There is no tenderness.  Musculoskeletal: Normal range of motion.  Neurological: She is alert and oriented to person, place, and time.  Skin: Skin is warm and dry.  Psychiatric: She has a normal mood and affect. Judgment normal.  Nursing note and vitals reviewed.    ED Treatments / Results  DIAGNOSTIC STUDIES: Oxygen Saturation is 100% on RA, normal by my interpretation.    COORDINATION OF CARE: 9:20 PM Discussed treatment plan with pt at bedside and pt agreed to plan.  Labs (all labs ordered are listed, but only abnormal results are displayed) Labs Reviewed - No data to display  EKG  EKG Interpretation None       Radiology No results found.  Procedures Procedures (including critical care time)  Medications Ordered in ED Medications - No data to display   Initial Impression / Assessment and Plan / ED Course  I have reviewed the triage vital signs and the nursing notes.  Pertinent labs & imaging results that were available during my care of the patient were reviewed by me and considered in my medical decision making (see chart for details).  Clinical Course     Patient presents with complaints of right flank and right upper quadrant pain that started earlier today. Initially, it appears that she was experiencing pain related to gallstones, however there was no evidence for this on the ultrasound and her laboratory studies were normal. She was then sent for a CT scan to rule out the possibility of renal calculus or other abnormality. This was also negative. Her urinalysis and laboratory studies are reassuring and I see no indication for further workup. She will be treated with pain medication and advised to  follow-up with primary Dr. if not improving. She is to return to the ER if her symptoms worsen or change.  Final Clinical Impressions(s) / ED Diagnoses   Final diagnoses:  None    New Prescriptions New Prescriptions   No medications on file   I personally performed the services described in this documentation, which was scribed in my presence. The recorded information has been reviewed and is accurate.        Geoffery Lyons, MD 11/24/16 2240

## 2016-11-24 NOTE — Discharge Instructions (Signed)
Ibuprofen 600 mg every 6 hours as needed for pain.  Hydrocodone as prescribed as needed for pain not relieved with ibuprofen.  Return to the emergency department if you develop worsening pain, high fevers, bloody stools, or other new and concerning symptoms.

## 2016-12-29 ENCOUNTER — Emergency Department (HOSPITAL_BASED_OUTPATIENT_CLINIC_OR_DEPARTMENT_OTHER): Payer: Self-pay

## 2016-12-29 ENCOUNTER — Encounter (HOSPITAL_BASED_OUTPATIENT_CLINIC_OR_DEPARTMENT_OTHER): Payer: Self-pay | Admitting: *Deleted

## 2016-12-29 ENCOUNTER — Other Ambulatory Visit: Payer: Self-pay | Admitting: Medical

## 2016-12-29 ENCOUNTER — Emergency Department (HOSPITAL_BASED_OUTPATIENT_CLINIC_OR_DEPARTMENT_OTHER)
Admission: EM | Admit: 2016-12-29 | Discharge: 2016-12-30 | Disposition: A | Discharge status: Home / Self Care | Payer: Self-pay | Attending: Emergency Medicine | Admitting: Emergency Medicine

## 2016-12-29 DIAGNOSIS — R1110 Vomiting, unspecified: Secondary | ICD-10-CM | POA: Insufficient documentation

## 2016-12-29 DIAGNOSIS — R05 Cough: Secondary | ICD-10-CM | POA: Insufficient documentation

## 2016-12-29 DIAGNOSIS — R0789 Other chest pain: Secondary | ICD-10-CM | POA: Insufficient documentation

## 2016-12-29 DIAGNOSIS — R5383 Other fatigue: Secondary | ICD-10-CM | POA: Insufficient documentation

## 2016-12-29 DIAGNOSIS — Z79899 Other long term (current) drug therapy: Secondary | ICD-10-CM | POA: Insufficient documentation

## 2016-12-29 DIAGNOSIS — R197 Diarrhea, unspecified: Secondary | ICD-10-CM | POA: Insufficient documentation

## 2016-12-29 DIAGNOSIS — I10 Essential (primary) hypertension: Secondary | ICD-10-CM | POA: Insufficient documentation

## 2016-12-29 DIAGNOSIS — R059 Cough, unspecified: Secondary | ICD-10-CM

## 2016-12-29 DIAGNOSIS — R509 Fever, unspecified: Secondary | ICD-10-CM | POA: Insufficient documentation

## 2016-12-29 DIAGNOSIS — R110 Nausea: Secondary | ICD-10-CM

## 2016-12-29 IMAGING — CR DG CHEST 2V
2 series · 2 of 2 positions shown · non-contrast
Comparison: Chest radiograph [DATE]

CLINICAL DATA: Chest tightness and cough

EXAM:
CHEST  2 VIEW

[w chest pa]
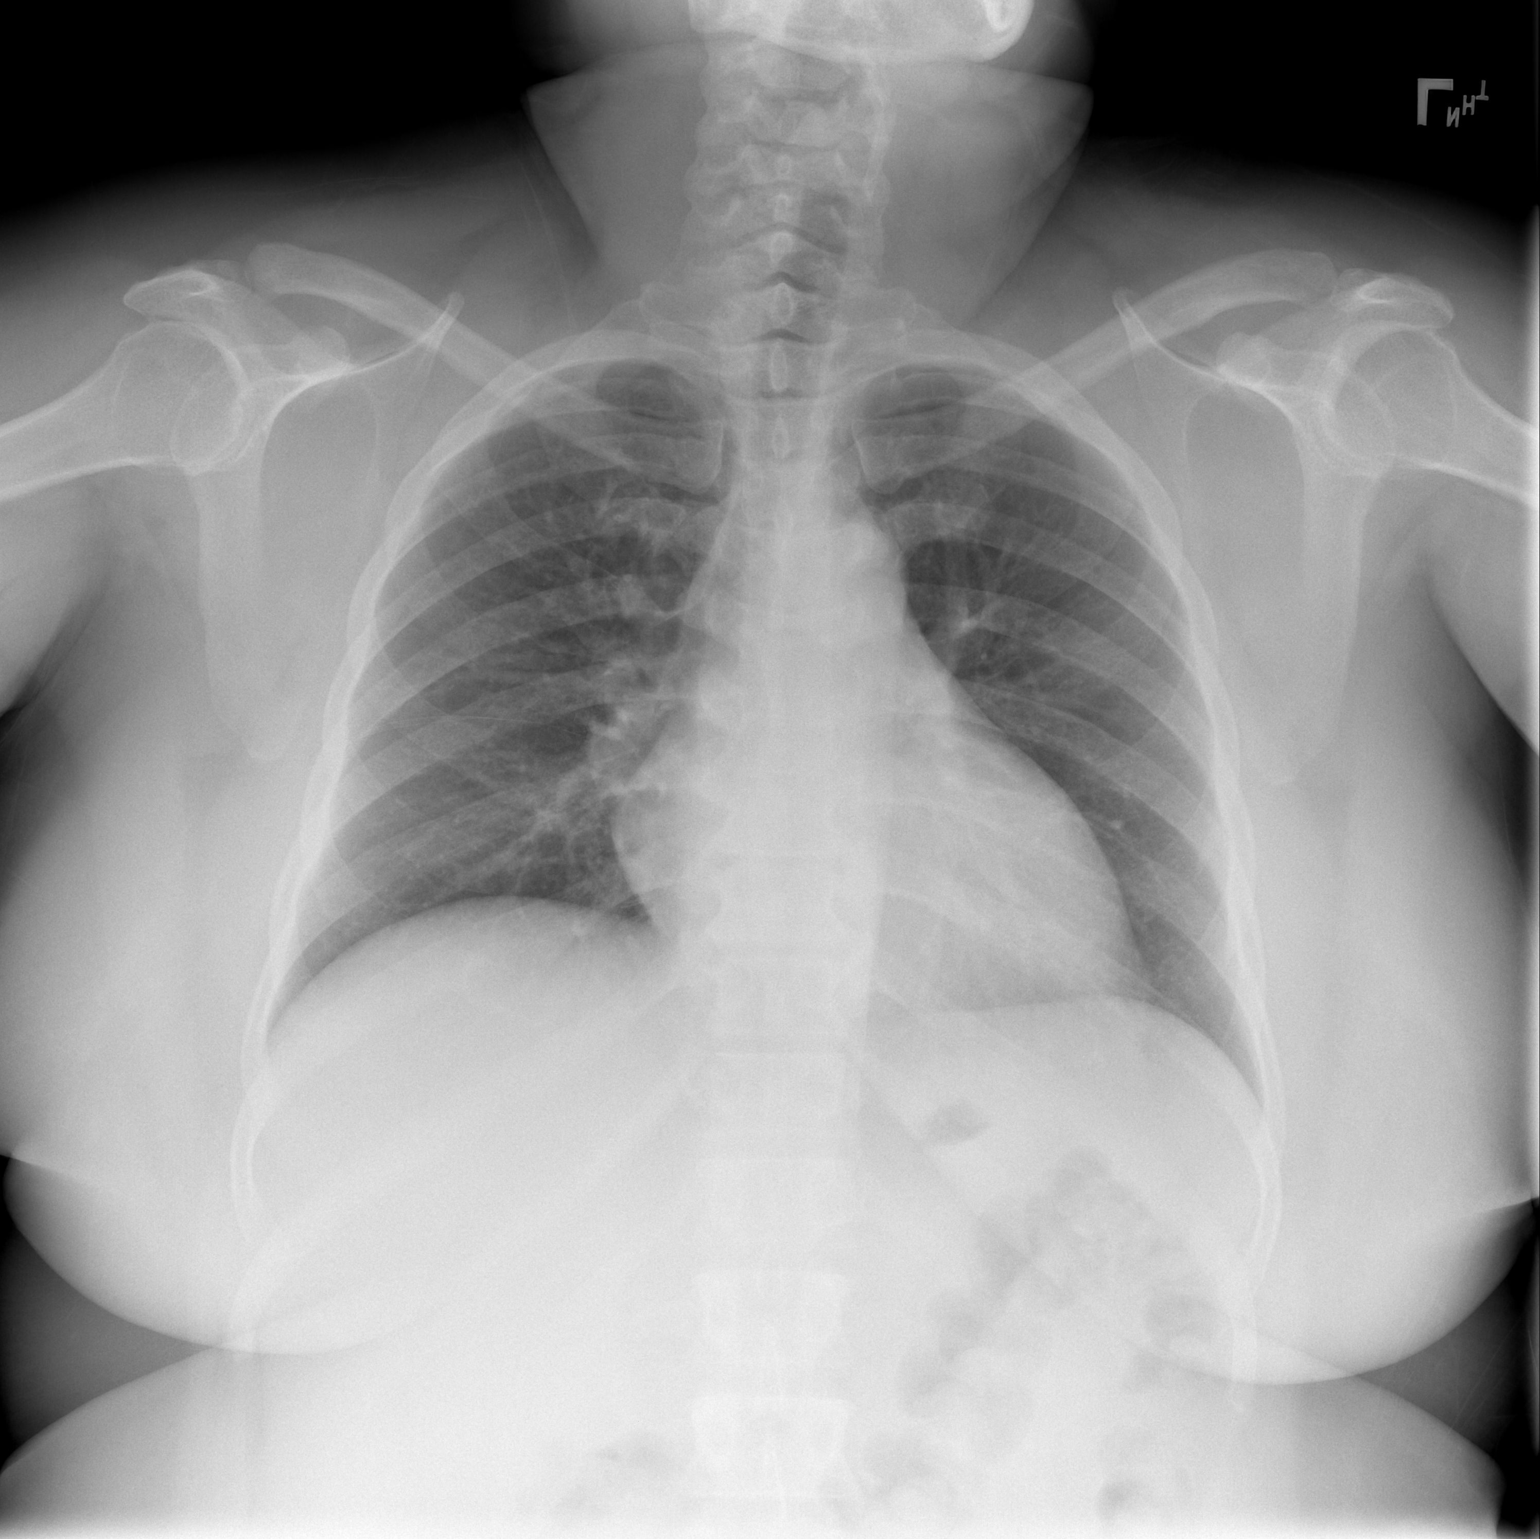

[w chest lat]
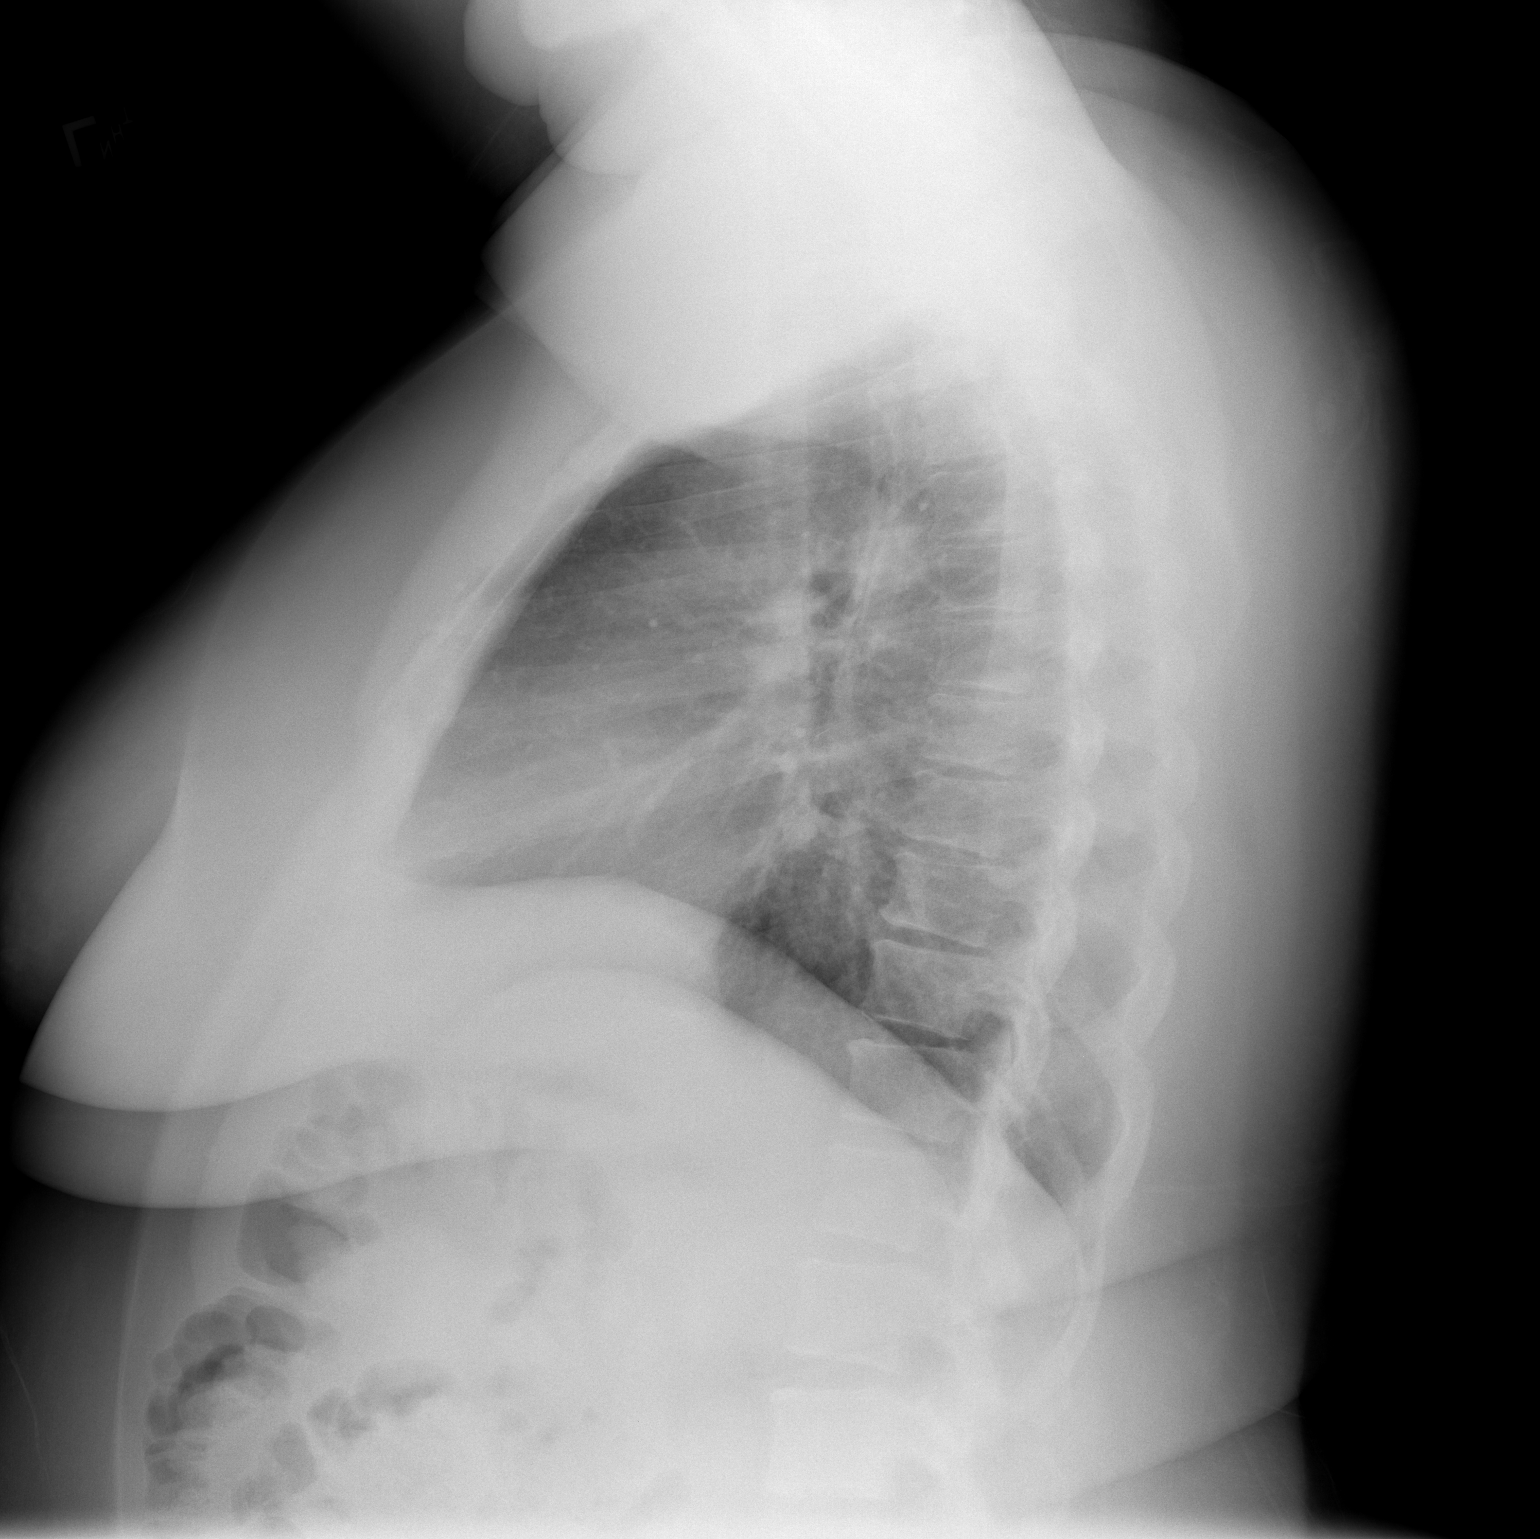

[2 of 2 positions shown; findings below may reference images not displayed]

FINDINGS: The lungs are well inflated. Cardiomediastinal contours are normal.
No pneumothorax or pleural effusion. No focal airspace consolidation
or pulmonary edema.
IMPRESSION: Clear lungs.

## 2016-12-29 MED ORDER — ONDANSETRON 4 MG PO TBDP: 4.0000 mg | ORAL_TABLET | Freq: Three times a day (TID) | ORAL | 0 refills | Status: DC | PRN

## 2016-12-29 MED ORDER — BENZONATATE 100 MG PO CAPS: 100.0000 mg | ORAL_CAPSULE | Freq: Three times a day (TID) | ORAL | 0 refills | Status: DC

## 2016-12-29 NOTE — ED Notes (Signed)
Pt. Is alert and oriented with no distress noted.   J G PA C at bedside of pt. Reports lung snds  Clear bilat. Upon auscultation.

## 2016-12-29 NOTE — Discharge Instructions (Signed)
Please read and follow all provided instructions.  Your diagnoses today include:  1. Cough   2. Nausea   3. Diarrhea, unspecified type     Tests performed today include:  Chest x-ray - no pneumonia  Vital signs. See below for your results today.   Medications prescribed:   Tessalon Perles - cough suppressant medication   Zofran (ondansetron) - for nausea and vomiting  Take any prescribed medications only as directed.  Home care instructions:  Follow any educational materials contained in this packet.  BE VERY CAREFUL not to take multiple medicines containing Tylenol (also called acetaminophen). Doing so can lead to an overdose which can damage your liver and cause liver failure and possibly death.   Follow-up instructions: Please follow-up with your primary care provider in the next 3 days for further evaluation of your symptoms.   Return instructions:   Please return to the Emergency Department if you experience worsening symptoms.   Please return if you have any other emergent concerns.  Additional Information:  Your vital signs today were: BP 135/85    Pulse 87    Temp 98.7 F (37.1 C) (Oral)    Resp 20    Ht 5\' 2"  (1.575 m)    Wt 102.1 kg    SpO2 100%    BMI 41.15 kg/m  If your blood pressure (BP) was elevated above 135/85 this visit, please have this repeated by your doctor within one month. --------------

## 2016-12-29 NOTE — ED Provider Notes (Signed)
[facility]   CSN: [id number] Arrival date & time: 12/29/16  1720  By signing my name below, I, [name], attest that this documentation has been prepared under the direction and in the presence of Renne Crigler, PA-C.  Electronically Signed: [name], ED Scribe. 12/29/16. 6:55 PM.  History   Chief Complaint Chief Complaint  Patient presents with  . Nausea   The history is provided by the patient. No language interpreter was used.    HPI Comments: Dana Patterson is a 41 y.o. female with a h/o HTN, obesity, who presents to the Emergency Department complaining of persistent nausea beginning five days ago. She notes associated chest tightness, chills, subjective fever, non-productive cough, and fatigue. Pt also reports that she has had five episodes of diarrhea today as well, and that this began two days ago. She also notes that her symptoms initially began with a sore throat, but this has since resolved. Pt has been taking Mucinex, Flonase, and using an albuterol inhaler at home without relief of her symptoms. No sick contacts with similar symptoms. No h/o asthma, COPD, or pulmonary issues otherwise. Pt denies vomiting, rhinorrhea, ear pain, abdominal pain, urgency, frequency, hematuria, dysuria, difficulty urinating, wheezing, shortness of breath, chest pain, leg swelling, or any other associated symptoms.   Past Medical History:  Diagnosis Date  . Allergy   . HTN (hypertension) 10/29/2015  . Hyperlipidemia    Patient Active Problem List   Diagnosis Date Noted  . Morbid obesity (HCC) 01/14/2016  . Hypokalemia 01/14/2016  . Obstructive sleep apnea 01/14/2016  . Palpitations 01/14/2016  . Metabolic syndrome 01/14/2016  . Left hip pain 12/09/2015  . HTN (hypertension) 10/29/2015  . Allergic rhinitis 10/29/2015  . Pain in joint, shoulder region 10/29/2015  . Neck pain 10/29/2015   Past Surgical History:  Procedure Laterality Date  .  ABDOMINAL HYSTERECTOMY     OB History    No data available     Home Medications    Prior to Admission medications   Medication Sig Start Date End Date Taking? Authorizing Provider  albuterol (PROVENTIL HFA;VENTOLIN HFA) 108 (90 Base) MCG/ACT inhaler Inhale 2 puffs into the lungs every 4 (four) hours as needed. 11/23/15 11/22/16  Historical Provider, MD  fluticasone (FLONASE) 50 MCG/ACT nasal spray Place 2 sprays into both nostrils daily. 10/29/15   Ramon Dredge Saguier, PA-C  hydrochlorothiazide (HYDRODIURIL) 25 MG tablet TAKE 1 TABLET (25 MG TOTAL) BY MOUTH DAILY. 07/29/16   Lelon Perla Chase, DO  HYDROcodone-acetaminophen (NORCO) 5-325 MG tablet Take 1-2 tablets by mouth every 6 (six) hours as needed. 11/24/16   Geoffery Lyons, MD  loratadine (CLARITIN) 10 MG tablet Take 1 tablet (10 mg total) by mouth daily. 10/29/15   Ramon Dredge Saguier, PA-C  montelukast (SINGULAIR) 10 MG tablet Take 1 tablet (10 mg total) by mouth at bedtime. 12/21/15   Ramon Dredge Saguier, PA-C  potassium chloride SA (K-DUR,KLOR-CON) 20 MEQ tablet Take 1 tablet (20 mEq total) by mouth daily. 11/24/15   Donato Schultz, DO   Family History Family History  Problem Relation Age of Onset  . Heart disease Mother   . Kidney disease Mother   . Hypertension Mother   . Hypertension Father    Social History Social History  Substance Use Topics  . Smoking status: Never Smoker  . Smokeless tobacco: Never Used  . Alcohol use No   Allergies   Patient has no known allergies.  Review of Systems Review of Systems  Constitutional: Positive for chills, fatigue and fever (subjective).  HENT: Negative for congestion, ear pain, rhinorrhea, sinus pressure and sore throat.   Eyes: Negative for redness.  Respiratory: Positive for cough and chest tightness. Negative for shortness of breath and wheezing.   Cardiovascular: Negative for chest pain and leg swelling.  Gastrointestinal: Positive for diarrhea and nausea. Negative for abdominal pain and  vomiting.  Genitourinary: Negative for difficulty urinating, dysuria, frequency, hematuria and urgency.  Musculoskeletal: Negative for myalgias and neck stiffness.  Skin: Negative for rash.  Neurological: Negative for headaches.  Hematological: Negative for adenopathy.   Physical Exam Updated Vital Signs BP 135/85   Pulse 87   Temp 98.7 F (37.1 C) (Oral)   Resp 20   Ht 5\' 2"  (1.575 m)   Wt 225 lb (102.1 kg)   SpO2 100%   BMI 41.15 kg/m   Physical Exam  Constitutional: She appears well-developed and well-nourished. No distress.  HENT:  Head: Normocephalic and atraumatic.  Right Ear: Tympanic membrane, external ear and ear canal normal.  Left Ear: Tympanic membrane, external ear and ear canal normal.  Nose: Nose normal. No mucosal edema or rhinorrhea.  Mouth/Throat: Uvula is midline, oropharynx is clear and moist and mucous membranes are normal. Mucous membranes are not dry. No oral lesions. No trismus in the jaw. No uvula swelling. No oropharyngeal exudate, posterior oropharyngeal edema, posterior oropharyngeal erythema or tonsillar abscesses.  Eyes: Conjunctivae are normal. Right eye exhibits no discharge. Left eye exhibits no discharge.  Neck: Normal range of motion. Neck supple.  Cardiovascular: Normal rate, regular rhythm and normal heart sounds.   Pulmonary/Chest: Effort normal and breath sounds normal. No respiratory distress. She has no wheezes. She has no rales.  Abdominal: Soft. She exhibits no distension. There is no tenderness. There is no rebound and no guarding.  Musculoskeletal: Normal range of motion.  Lymphadenopathy:    She has no cervical adenopathy.  Neurological: She is alert.  Skin: Skin is warm and dry. No pallor.  Psychiatric: She has a normal mood and affect. Her behavior is normal.  Nursing note and vitals reviewed.  ED Treatments / Results  DIAGNOSTIC STUDIES: Oxygen Saturation is 100% on RA, normal by my interpretation.   COORDINATION OF  CARE: 6:54 PM-Discussed next steps with pt including a CXR. Pt verbalized understanding and is agreeable with the plan.   Radiology Dg Chest 2 View  Result Date: 12/29/2016 CLINICAL DATA:  Chest tightness and cough EXAM: CHEST  2 VIEW COMPARISON:  Chest radiograph 01/30/2015 FINDINGS: The lungs are well inflated. Cardiomediastinal contours are normal. No pneumothorax or pleural effusion. No focal airspace consolidation or pulmonary edema. IMPRESSION: Clear lungs. Electronically Signed   By: Deatra Robinson M.D.   On: 12/29/2016 20:05    Procedures Procedures   Medications Ordered in ED Medications - No data to display  Initial Impression / Assessment and Plan / ED Course  I have reviewed the triage vital signs and the nursing notes.  Pertinent labs & imaging results that were available during my care of the patient were reviewed by me and considered in my medical decision making (see chart for details).     Patient seen and examined.    Vital signs reviewed and are as follows: BP 135/85   Pulse 87   Temp 98.7 F (37.1 C) (Oral)   Resp 20   Ht 5\' 2"  (1.575 m)   Wt 102.1 kg   SpO2 100%   BMI 41.15 kg/m   Discussed  x-ray results with patient. She may use her home inhaler as needed. Will discharge to home with Zofran and Tessalon.  Patient counseled on supportive care and s/s to return including worsening symptoms, persistent fever, persistent vomiting, or if they have any other concerns. Urged to see PCP if symptoms persist for more than 3 days. Patient verbalizes understanding and agrees with plan.    Final Clinical Impressions(s) / ED Diagnoses   Final diagnoses:  Cough  Nausea  Diarrhea, unspecified type   Well-appearing patient with constellation of symptoms of cough, nausea, diarrhea. Abdomen is soft and nontender. Chest x-ray is clear. Vital signs are reassuring. Will treat symptoms. Return instructions as above.  New Prescriptions New Prescriptions   BENZONATATE  (TESSALON) 100 MG CAPSULE    Take 1 capsule (100 mg total) by mouth every 8 (eight) hours.   ONDANSETRON (ZOFRAN ODT) 4 MG DISINTEGRATING TABLET    Take 1 tablet (4 mg total) by mouth every 8 (eight) hours as needed for nausea or vomiting.   I personally performed the services described in this documentation, which was scribed in my presence. The recorded information has been reviewed and is accurate.     Renne CriglerJoshua Cassidey Barrales, PA-C 12/29/16 2053    Canary Brimhristopher J Tegeler, MD 12/30/16 [phone]

## 2016-12-29 NOTE — ED Triage Notes (Signed)
Cough, headache, nausea, chills, fever, diarrhea and urinary frequency x 5 days.

## 2016-12-29 NOTE — ED Notes (Signed)
Pt. Wants a chest x ray done.

## 2017-01-05 ENCOUNTER — Other Ambulatory Visit: Payer: Self-pay | Admitting: Family Medicine

## 2017-01-05 DIAGNOSIS — I10 Essential (primary) hypertension: Secondary | ICD-10-CM

## 2017-03-29 ENCOUNTER — Encounter (HOSPITAL_BASED_OUTPATIENT_CLINIC_OR_DEPARTMENT_OTHER): Payer: Self-pay

## 2017-03-29 ENCOUNTER — Emergency Department (HOSPITAL_BASED_OUTPATIENT_CLINIC_OR_DEPARTMENT_OTHER): Payer: Self-pay

## 2017-03-29 ENCOUNTER — Emergency Department (HOSPITAL_BASED_OUTPATIENT_CLINIC_OR_DEPARTMENT_OTHER)
Admission: EM | Admit: 2017-03-29 | Discharge: 2017-03-30 | Disposition: A | Discharge status: Home / Self Care | Payer: Self-pay | Attending: Emergency Medicine | Admitting: Emergency Medicine

## 2017-03-29 DIAGNOSIS — I10 Essential (primary) hypertension: Secondary | ICD-10-CM | POA: Insufficient documentation

## 2017-03-29 DIAGNOSIS — J189 Pneumonia, unspecified organism: Secondary | ICD-10-CM | POA: Insufficient documentation

## 2017-03-29 DIAGNOSIS — M25511 Pain in right shoulder: Secondary | ICD-10-CM

## 2017-03-29 DIAGNOSIS — E876 Hypokalemia: Secondary | ICD-10-CM | POA: Insufficient documentation

## 2017-03-29 DIAGNOSIS — Z79899 Other long term (current) drug therapy: Secondary | ICD-10-CM | POA: Insufficient documentation

## 2017-03-29 LAB — CBC
HCT: 34.6 % — ABNORMAL LOW (ref 36.0–46.0)
HEMOGLOBIN: 12.1 g/dL (ref 12.0–15.0)
MCH: 31.1 pg (ref 26.0–34.0)
MCHC: 35 g/dL (ref 30.0–36.0)
MCV: 88.9 fL (ref 78.0–100.0)
Platelets: 237 10*3/uL (ref 150–400)
RBC: 3.89 MIL/uL (ref 3.87–5.11)
RDW: 12.3 % (ref 11.5–15.5)
WBC: 7.8 10*3/uL (ref 4.0–10.5)

## 2017-03-29 LAB — BASIC METABOLIC PANEL
Anion gap: 8 (ref 5–15)
BUN: 10 mg/dL (ref 6–20)
CHLORIDE: 101 mmol/L (ref 101–111)
CO2: 30 mmol/L (ref 22–32)
Calcium: 9.1 mg/dL (ref 8.9–10.3)
Creatinine, Ser: 0.72 mg/dL (ref 0.44–1.00)
GFR calc Af Amer: >60 mL/min (ref >60)
GFR calc non Af Amer: >60 mL/min (ref >60)
GLUCOSE: 133 mg/dL — AB (ref 65–99)
POTASSIUM: 2.7 mmol/L — AB (ref 3.5–5.1)
Sodium: 139 mmol/L (ref 135–145)

## 2017-03-29 IMAGING — CR DG CHEST 2V
2 series · 2 of 2 positions shown · non-contrast
Comparison: [DATE].

CLINICAL DATA: Cough with shortness of breath. Nasal congestion.
Sinus pressure.

EXAM:
CHEST  2 VIEW

[w chest pa]
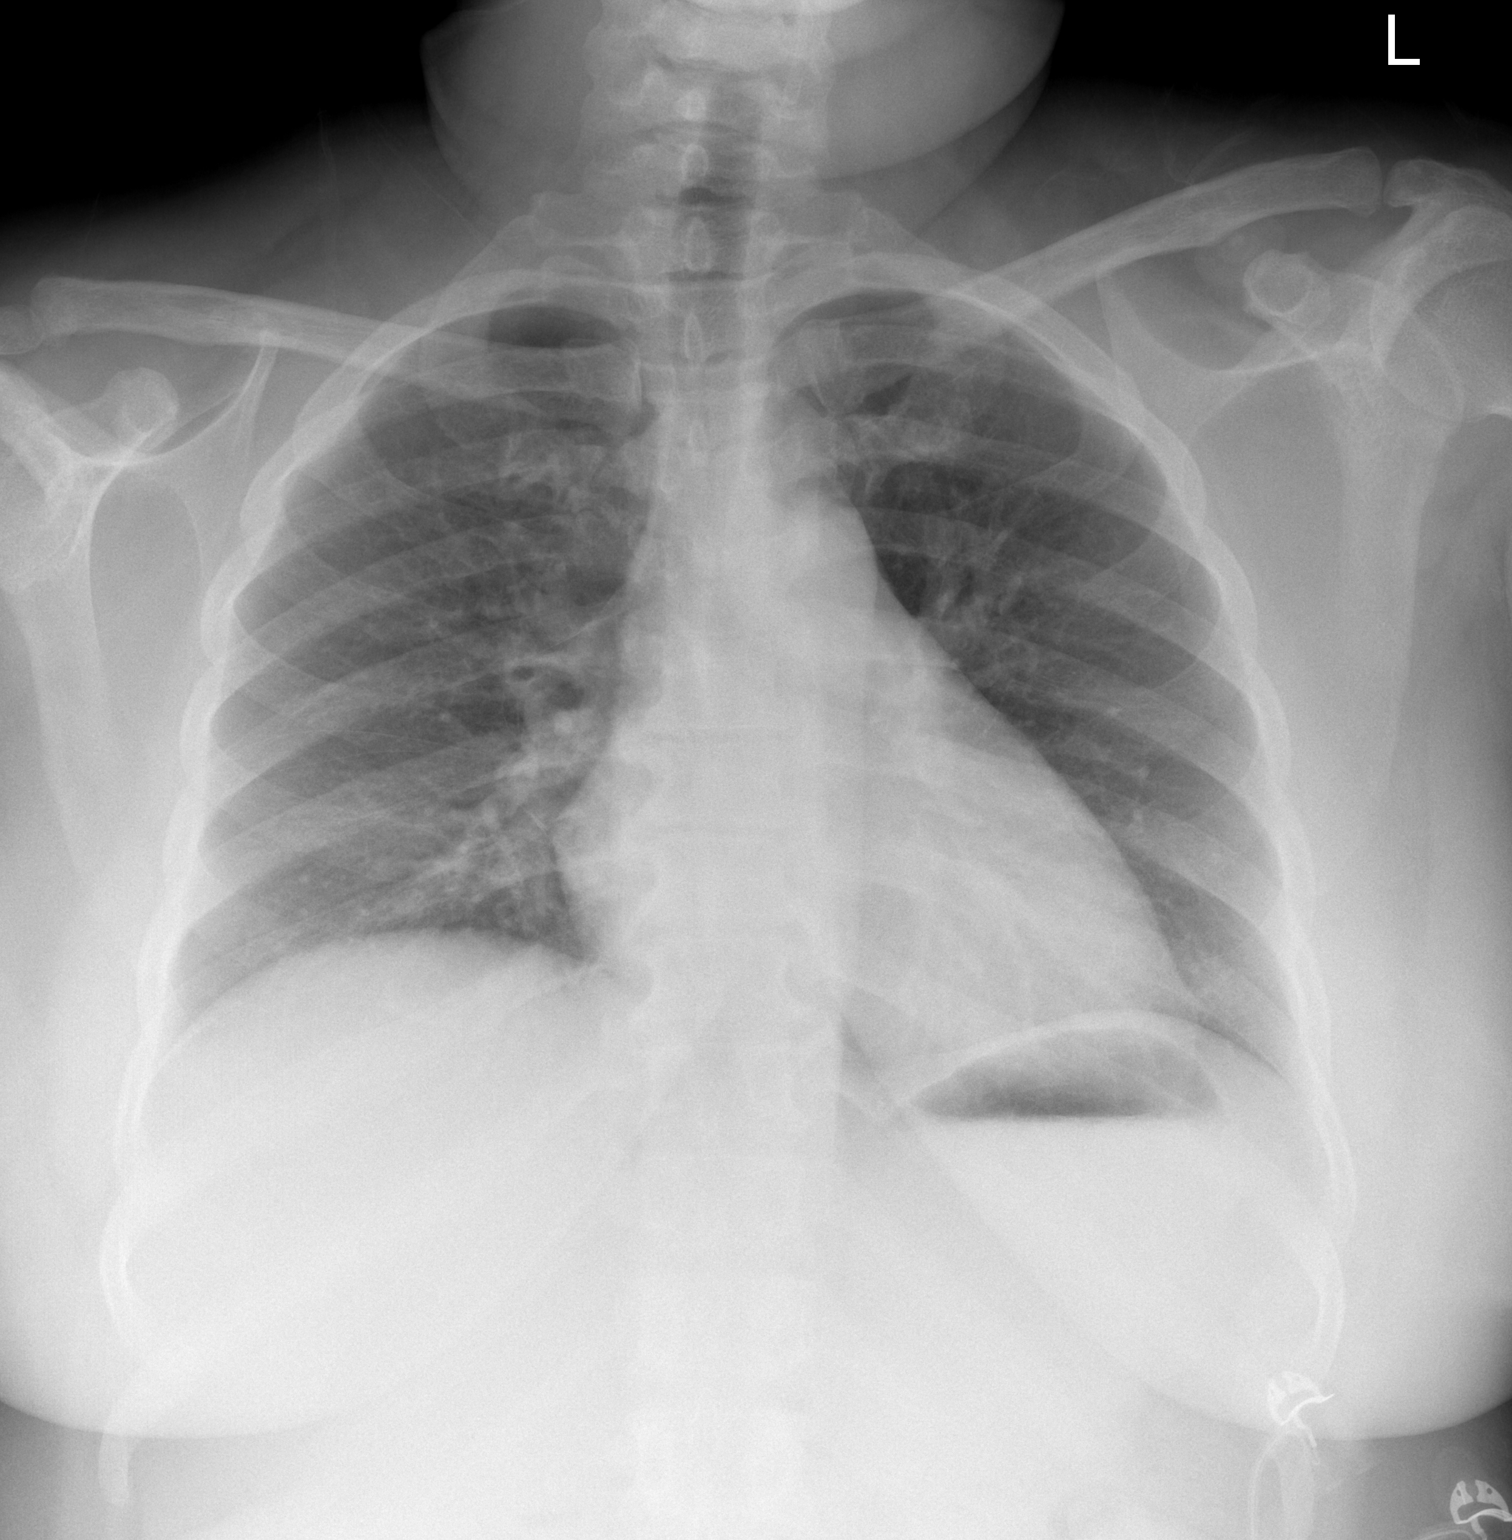

[w chest lat]
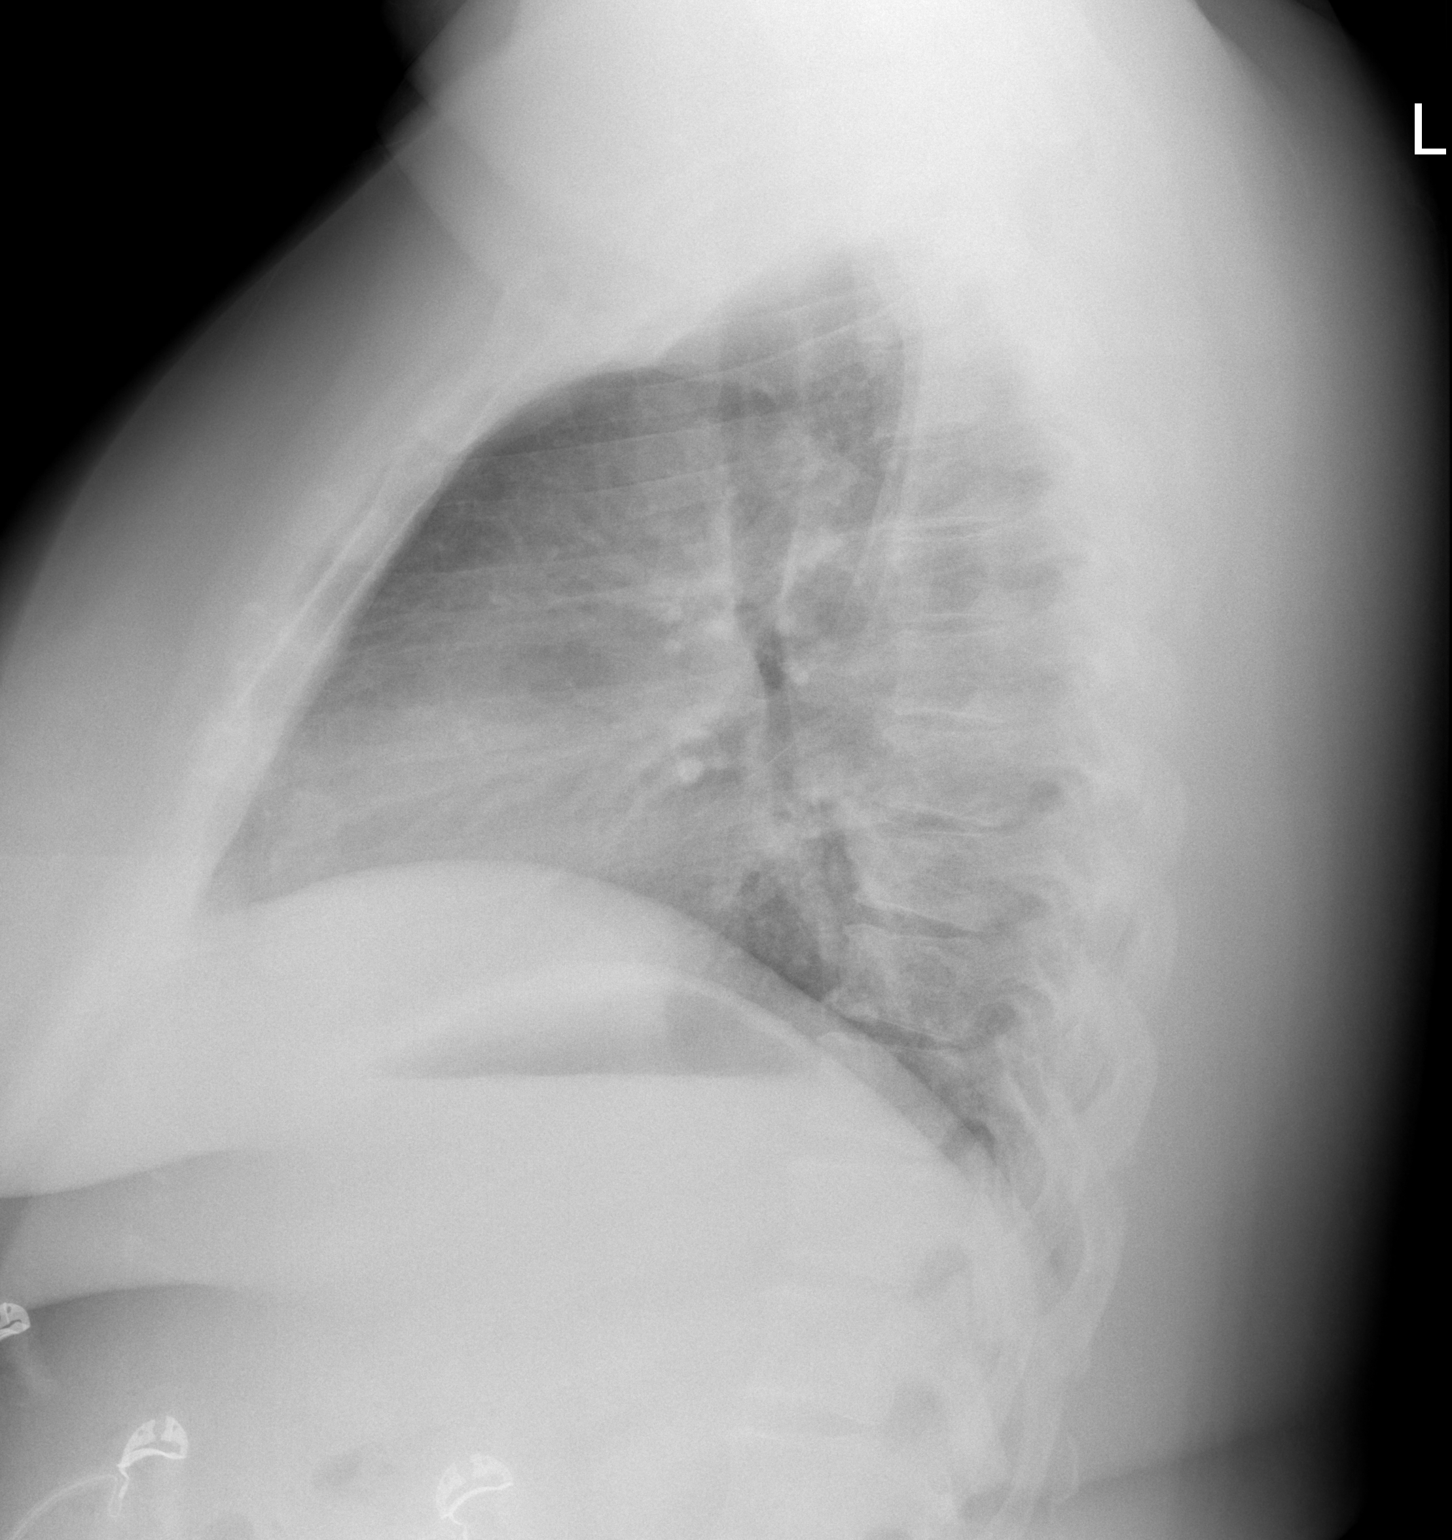

[2 of 2 positions shown; findings below may reference images not displayed]

FINDINGS: Normal cardiomediastinal silhouette. Clear LEFT lung. Subtle
asymmetry opacity at the RIGHT base could represent early infiltrate
versus subsegmental atelectasis. No effusion or pneumothorax.
Degenerative change thoracic spine. Worsening aeration from priors.
IMPRESSION: Subtle asymmetric opacity at the RIGHT base could represent
atelectasis or infiltrate. Correlate clinically.

## 2017-03-29 MED ORDER — POTASSIUM CHLORIDE CRYS ER 20 MEQ PO TBCR
20.0000 meq | EXTENDED_RELEASE_TABLET | Freq: Every day | ORAL | 1 refills | Status: DC
Start: 2017-03-29 — End: 2017-04-13

## 2017-03-29 MED ORDER — POTASSIUM CHLORIDE 10 MEQ/100ML IV SOLN
10.0000 meq | Freq: Once | INTRAVENOUS | Status: AC
  Administered 2017-03-29: 10 meq via INTRAVENOUS
  Filled 2017-03-29: qty 100

## 2017-03-29 MED ORDER — POTASSIUM CHLORIDE CRYS ER 20 MEQ PO TBCR
40.0000 meq | EXTENDED_RELEASE_TABLET | Freq: Once | ORAL | Status: AC
  Administered 2017-03-29: 40 meq via ORAL
  Filled 2017-03-29: qty 2

## 2017-03-29 MED ORDER — AZITHROMYCIN 250 MG PO TABS: 250.0000 mg | ORAL_TABLET | Freq: Every day | ORAL | 0 refills | Status: DC

## 2017-03-29 NOTE — ED Triage Notes (Signed)
c/o nasal congestion x 2 days-numbness to left fingers x today-NAD-steady gait

## 2017-03-29 NOTE — ED Provider Notes (Signed)
[facility] Provider Note   CSN: [id number] Arrival date & time: 03/29/17  2037  By signing my name below, I, Majel Homer, attest that this documentation has been prepared under the direction and in the presence of Linwood Dibbles, MD . Electronically Signed: Majel Homer, Scribe. 03/29/2017. 9:32 PM.  History   Chief Complaint Chief Complaint  Patient presents with  . Nasal Congestion  . Numbness   The history is provided by the patient. No language interpreter was used.   HPI Comments: Dana Patterson is a 41 y.o. female with PMHx of HTN and HLD, who presents to the Emergency Department complaining of gradually worsening, nasal congestion and sinus pressure that began yesterday and worsened this moring. Pt reports she was at work today and began to experience subjective numbness in her left fingertips that is still present now in the ED. She notes associated intermittent, central chest pain that began this afternoon. She reports she has taken Claritin and Flonase to relieve her symptoms with no relief. Pt denies any fever, chills, vomiting, or diarrhea.   Past Medical History:  Diagnosis Date  . Allergy   . HTN (hypertension) 10/29/2015  . Hyperlipidemia    Patient Active Problem List   Diagnosis Date Noted  . Morbid obesity (HCC) 01/14/2016  . Hypokalemia 01/14/2016  . Obstructive sleep apnea 01/14/2016  . Palpitations 01/14/2016  . Metabolic syndrome 01/14/2016  . Left hip pain 12/09/2015  . HTN (hypertension) 10/29/2015  . Allergic rhinitis 10/29/2015  . Pain in joint, shoulder region 10/29/2015  . Neck pain 10/29/2015   Past Surgical History:  Procedure Laterality Date  . ABDOMINAL HYSTERECTOMY     OB History    No data available     Home Medications    Prior to Admission medications   Medication Sig Start Date End Date Taking? Authorizing Provider  azithromycin (ZITHROMAX) 250 MG tablet Take 1 tablet (250 mg total) by mouth daily. Take first 2 tablets  together, then 1 every day until finished. 03/29/17   Linwood Dibbles, MD  fluticasone (FLONASE) 50 MCG/ACT nasal spray PLACE 2 SPRAYS INTO BOTH NOSTRILS DAILY. 01/03/17   Donato Schultz, DO  hydrochlorothiazide (HYDRODIURIL) 25 MG tablet TAKE 1 TABLET (25 MG TOTAL) BY MOUTH DAILY. 01/05/17   Donato Schultz, DO  loratadine (CLARITIN) 10 MG tablet Take 1 tablet (10 mg total) by mouth daily. 10/29/15   Saguier, Ramon Dredge, PA-C  potassium chloride SA (K-DUR,KLOR-CON) 20 MEQ tablet Take 1 tablet (20 mEq total) by mouth daily. 03/29/17   Linwood Dibbles, MD    Family History Family History  Problem Relation Age of Onset  . Heart disease Mother   . Kidney disease Mother   . Hypertension Mother   . Hypertension Father     Social History Social History  Substance Use Topics  . Smoking status: Never Smoker  . Smokeless tobacco: Never Used  . Alcohol use No   Allergies   Patient has no known allergies.  Review of Systems Review of Systems  Constitutional: Negative for chills and fever.  HENT: Positive for congestion and sinus pressure.   Cardiovascular: Positive for chest pain.  Gastrointestinal: Negative for nausea and vomiting.  Neurological: Positive for numbness (subjective).   Physical Exam Updated Vital Signs BP 136/77 (BP Location: Left Arm)   Pulse 96   Temp 98.5 F (36.9 C) (Oral)   Resp 20   Ht 5\' 2"  (1.575 m)   Wt 227 lb (103 kg)  SpO2 98%   BMI 41.52 kg/m   Physical Exam  Constitutional: She is oriented to person, place, and time. She appears well-developed and well-nourished. No distress.  HENT:  Head: Normocephalic and atraumatic.  Right Ear: Tympanic membrane and external ear normal.  Left Ear: Tympanic membrane and external ear normal.  Nose: No rhinorrhea. No epistaxis.  Mouth/Throat: Oropharynx is clear and moist.  Sinus congestion   Eyes: Conjunctivae are normal. Right eye exhibits no discharge. Left eye exhibits no discharge. No scleral icterus.  Neck:  Neck supple. No tracheal deviation present.  Cardiovascular: Normal rate, regular rhythm and intact distal pulses.   Pulmonary/Chest: Effort normal and breath sounds normal. No stridor. No respiratory distress. She has no wheezes. She has no rales.  Abdominal: Soft. Bowel sounds are normal. She exhibits no distension. There is no tenderness. There is no rebound and no guarding.  Musculoskeletal: She exhibits no edema or tenderness.  Neurological: She is alert and oriented to person, place, and time. She has normal strength. No cranial nerve deficit (No facial droop, extraocular movements intact, tongue midline ) or sensory deficit. She exhibits normal muscle tone. She displays no seizure activity. Coordination normal.  No pronator drift bilateral upper extrem, able to hold both legs off bed for 5 seconds, sensation intact in all extremities, no visual field cuts, no left or right sided neglect, normal finger-nose exam bilaterally, no nystagmus noted   Skin: Skin is warm and dry. No rash noted.  Psychiatric: She has a normal mood and affect.  Nursing note and vitals reviewed.  ED Treatments / Results  DIAGNOSTIC STUDIES:  Oxygen Saturation is 98% on RA, normal by my interpretation.    COORDINATION OF CARE:  9:29 PM Discussed treatment plan with pt at bedside and pt agreed to plan.  Labs (all labs ordered are listed, but only abnormal results are displayed) Labs Reviewed  CBC - Abnormal; Notable for the following:       Result Value   HCT 34.6 (*)    All other components within normal limits  BASIC METABOLIC PANEL - Abnormal; Notable for the following:    Potassium 2.7 (*)    Glucose, Bld 133 (*)    All other components within normal limits    EKG  EKG Interpretation  Date/Time:  Wednesday Mar 29 2017 20:55:32 EDT Ventricular Rate:  87 PR Interval:    QRS Duration: 63 QT Interval:  444 QTC Calculation: 535 R Axis:   30 Text Interpretation:  Sinus rhythm Borderline T  abnormalities, diffuse leads Prolonged QT interval No old tracing to compare Confirmed by Jihan Rudy  MD-J, Alaena Strader [id number]) on 03/29/2017 9:13:31 PM      Radiology Dg Chest 2 View  Result Date: 03/29/2017 CLINICAL DATA:  Cough with shortness of breath. Nasal congestion. Sinus pressure. EXAM: CHEST  2 VIEW COMPARISON:  12/29/2016. FINDINGS: Normal cardiomediastinal silhouette. Clear LEFT lung. Subtle asymmetry opacity at the RIGHT base could represent early infiltrate versus subsegmental atelectasis. No effusion or pneumothorax. Degenerative change thoracic spine. Worsening aeration from priors. IMPRESSION: Subtle asymmetric opacity at the RIGHT base could represent atelectasis or infiltrate. Correlate clinically. Electronically Signed   By: Elsie Stain M.D.   On: 03/29/2017 22:12    Procedures Procedures (including critical care time)  Medications Ordered in ED Medications  potassium chloride SA (K-DUR,KLOR-CON) CR tablet 40 mEq (40 mEq Oral Given 03/29/17 2225)  potassium chloride 10 mEq in 100 mL IVPB (0 mEq Intravenous Stopped 03/29/17 2327)    Initial  Impression / Assessment and Plan / ED Course  I have reviewed the triage vital signs and the nursing notes.  Pertinent labs & imaging results that were available during my care of the patient were reviewed by me and considered in my medical decision making (see chart for details).   patient's chest x-ray suggests the possibility of pneumonia. She has had respiratory infection symptoms and has had some chest discomfort.  I will start her on a course of azithromycin. Patient's also very hypokalemic. She is on hydrochlorothiazide but does not take any supplemental potassium. She was given IV potassium and oral potassium. I'll discharge her home on a prescription of oral potassium and I would like her to follow up with her doctor next week to have her potassium level rechecked   Final Clinical Impressions(s) / ED Diagnoses   Final diagnoses:  Community  acquired pneumonia, unspecified laterality  Hypokalemia    New Prescriptions New Prescriptions   AZITHROMYCIN (ZITHROMAX) 250 MG TABLET    Take 1 tablet (250 mg total) by mouth daily. Take first 2 tablets together, then 1 every day until finished.   POTASSIUM CHLORIDE SA (K-DUR,KLOR-CON) 20 MEQ TABLET    Take 1 tablet (20 mEq total) by mouth daily.   I personally performed the services described in this documentation, which was scribed in my presence.  The recorded information has been reviewed and is accurate.    Linwood DibblesKnapp, Maximillian Habibi, MD 03/29/17 [phone]

## 2017-03-29 NOTE — ED Triage Notes (Signed)
Also c/o intermittent CP x today

## 2017-03-29 NOTE — Discharge Instructions (Signed)
Follow up with your primary care doctor to have your potassium level rechecked next week.   Take the medications as prescribed

## 2017-04-12 ENCOUNTER — Emergency Department (HOSPITAL_BASED_OUTPATIENT_CLINIC_OR_DEPARTMENT_OTHER): Payer: Self-pay

## 2017-04-12 ENCOUNTER — Emergency Department (HOSPITAL_BASED_OUTPATIENT_CLINIC_OR_DEPARTMENT_OTHER)
Admission: EM | Admit: 2017-04-12 | Discharge: 2017-04-13 | Disposition: A | Discharge status: Home / Self Care | Payer: Self-pay | Attending: Emergency Medicine | Admitting: Emergency Medicine

## 2017-04-12 ENCOUNTER — Encounter (HOSPITAL_BASED_OUTPATIENT_CLINIC_OR_DEPARTMENT_OTHER): Payer: Self-pay | Admitting: *Deleted

## 2017-04-12 DIAGNOSIS — I10 Essential (primary) hypertension: Secondary | ICD-10-CM | POA: Insufficient documentation

## 2017-04-12 DIAGNOSIS — I493 Ventricular premature depolarization: Secondary | ICD-10-CM | POA: Insufficient documentation

## 2017-04-12 DIAGNOSIS — R0789 Other chest pain: Secondary | ICD-10-CM | POA: Insufficient documentation

## 2017-04-12 DIAGNOSIS — Z79899 Other long term (current) drug therapy: Secondary | ICD-10-CM | POA: Insufficient documentation

## 2017-04-12 DIAGNOSIS — E876 Hypokalemia: Secondary | ICD-10-CM | POA: Insufficient documentation

## 2017-04-12 LAB — BASIC METABOLIC PANEL WITH GFR
Anion gap: 9 (ref 5–15)
BUN: 11 mg/dL (ref 6–20)
CO2: 27 mmol/L (ref 22–32)
Calcium: 9.4 mg/dL (ref 8.9–10.3)
Chloride: 101 mmol/L (ref 101–111)
Creatinine, Ser: 0.64 mg/dL (ref 0.44–1.00)
GFR calc Af Amer: >60 mL/min (ref >60)
GFR calc non Af Amer: >60 mL/min (ref >60)
Glucose, Bld: 113 mg/dL — ABNORMAL HIGH (ref 65–99)
Potassium: 3.2 mmol/L — ABNORMAL LOW (ref 3.5–5.1)
Sodium: 137 mmol/L (ref 135–145)

## 2017-04-12 IMAGING — DX DG CHEST 2V
2 series · 2 of 2 positions shown · non-contrast
Comparison: [DATE]

CLINICAL DATA: Central chest pain

EXAM:
CHEST  2 VIEW

[chest pa]
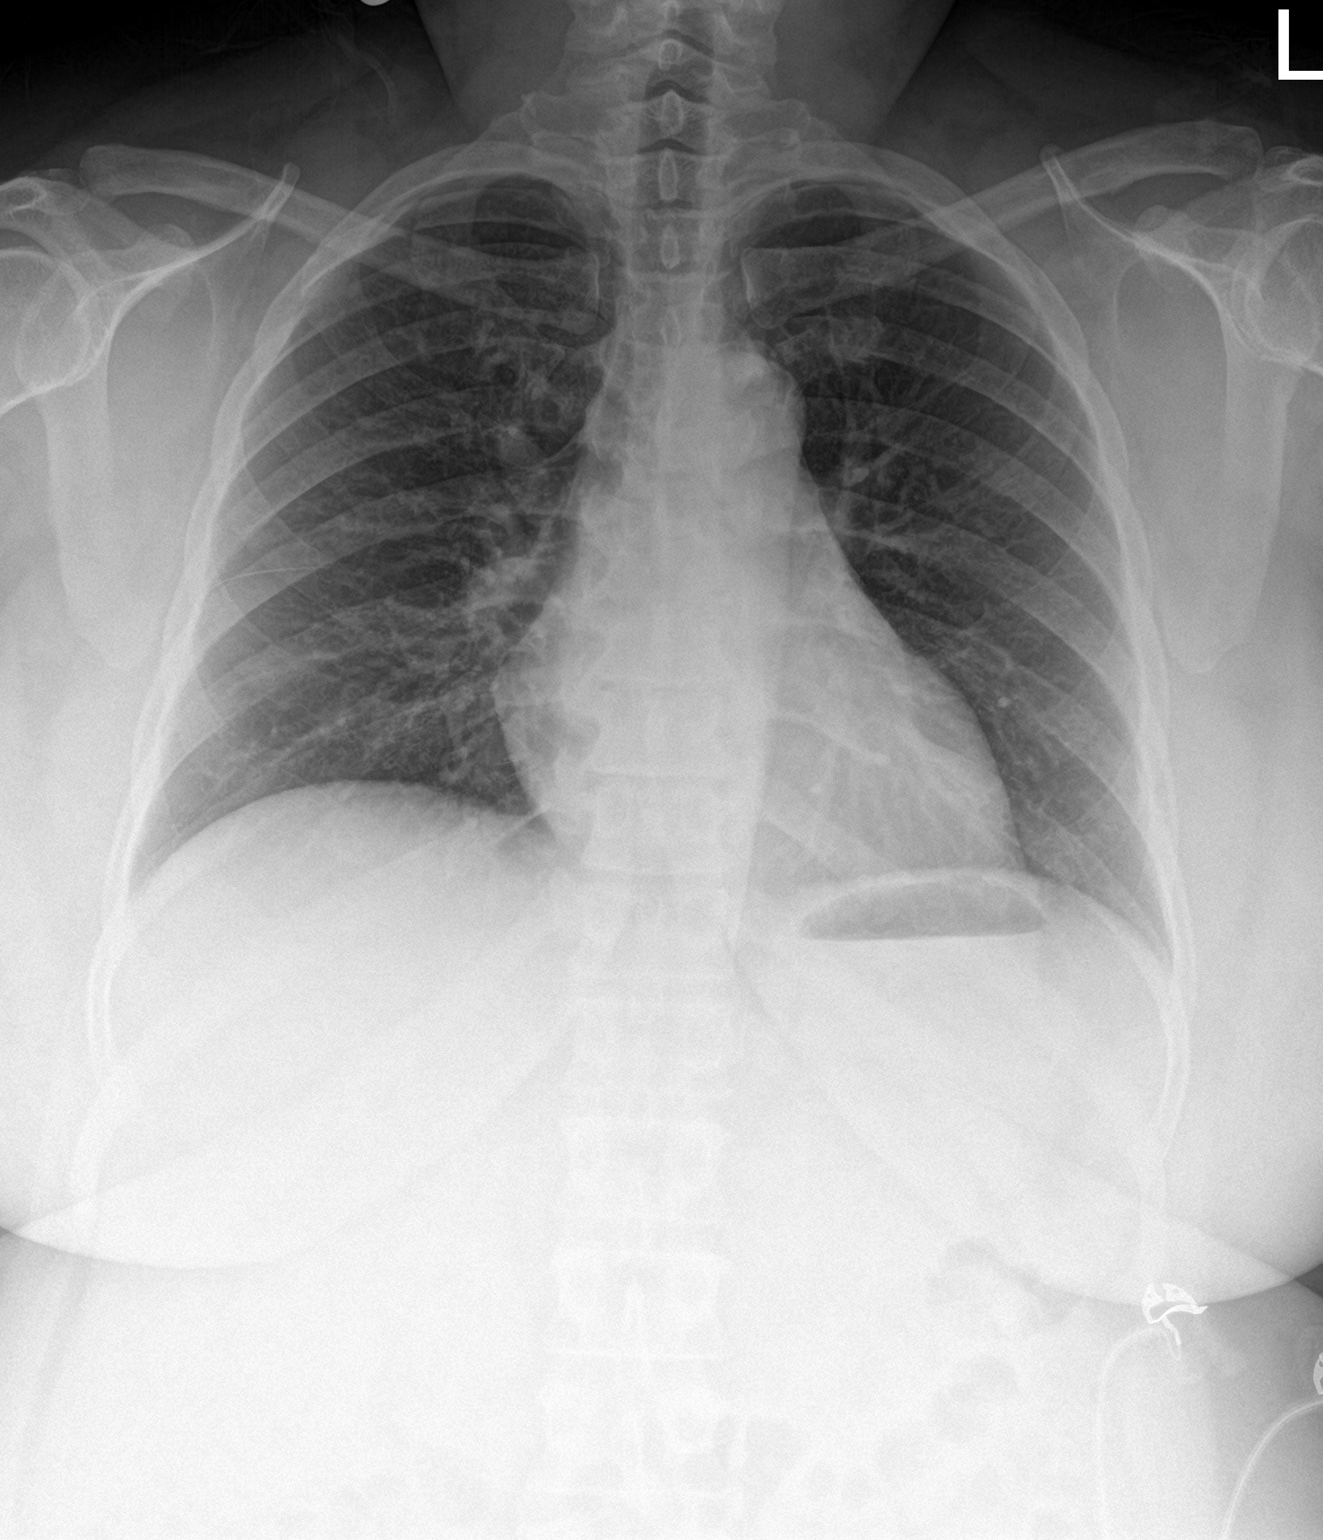

[chest lat]
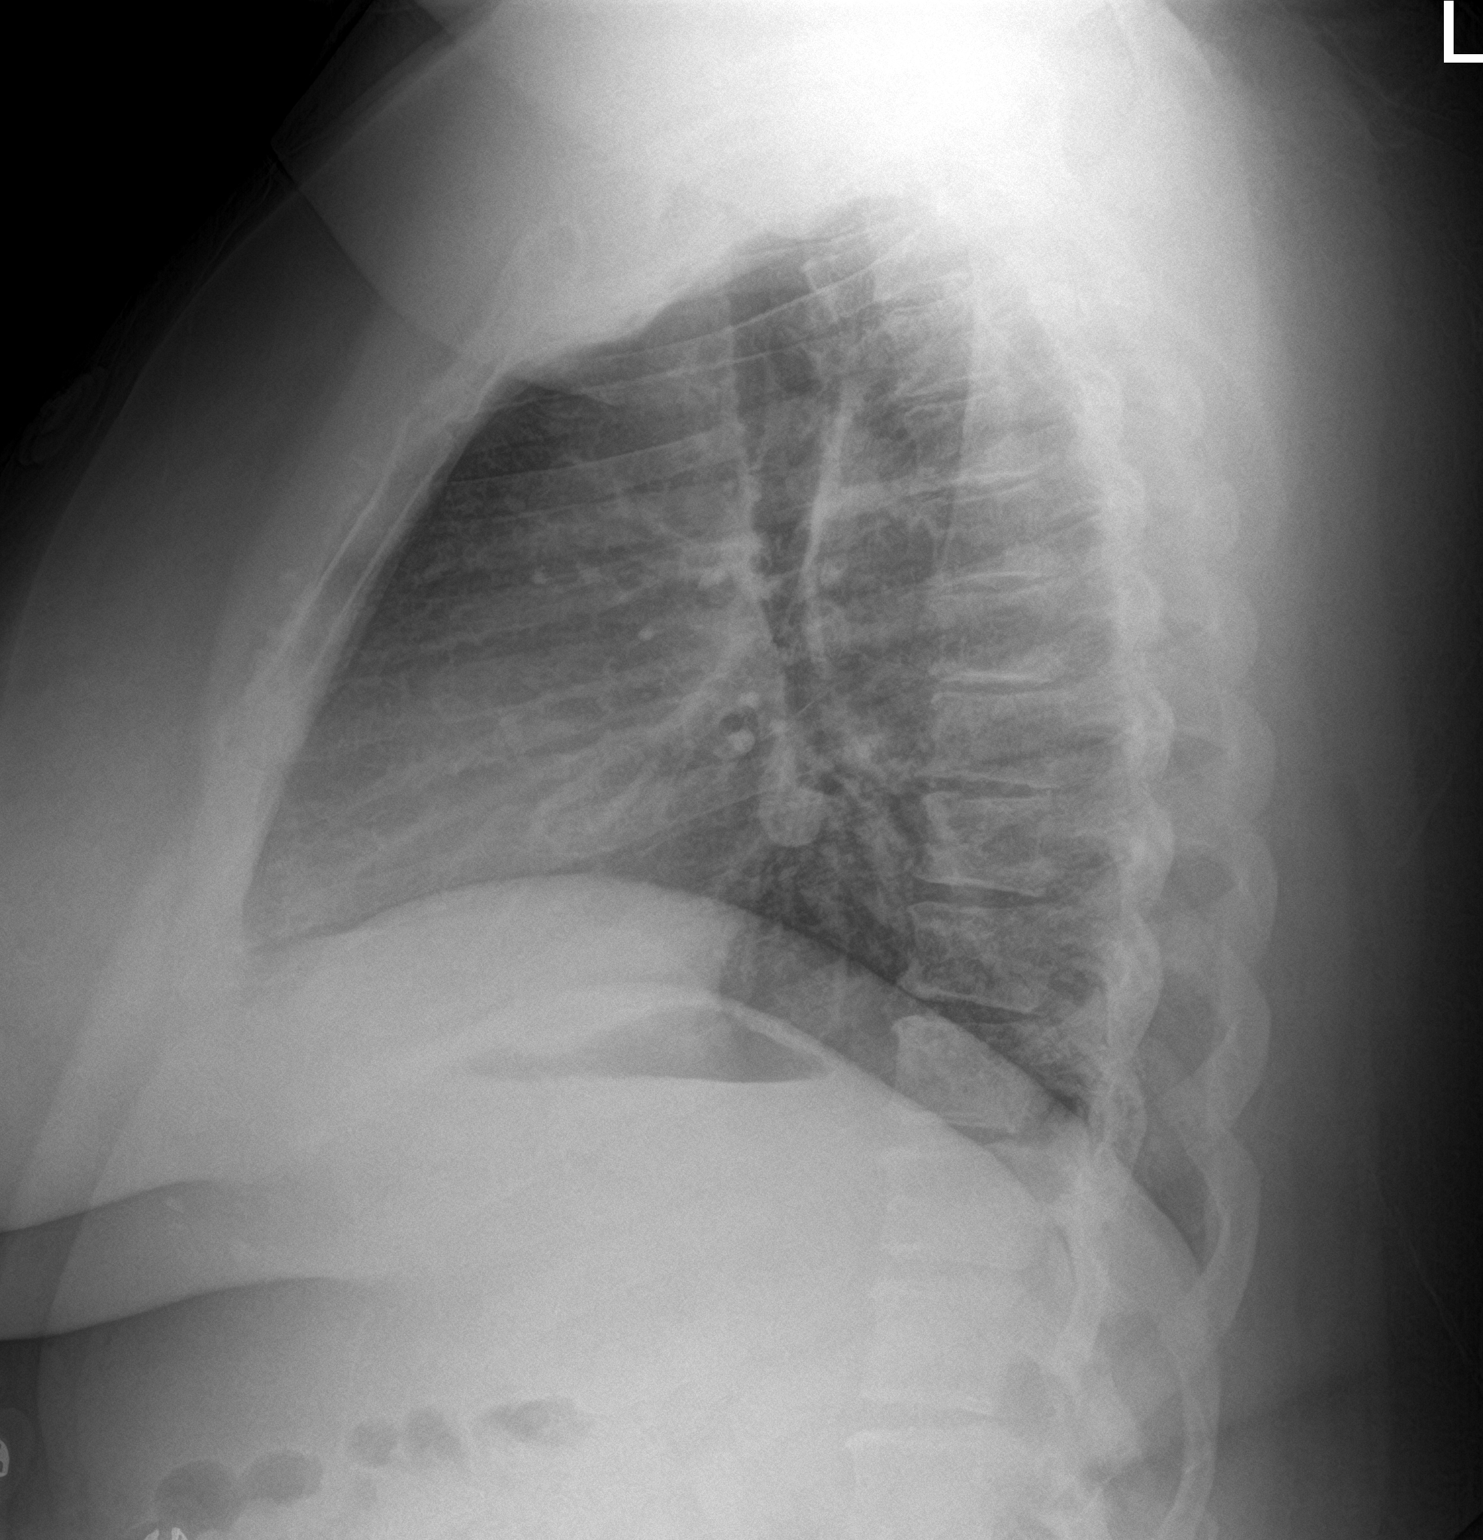

[2 of 2 positions shown; findings below may reference images not displayed]

FINDINGS: The heart size and mediastinal contours are within normal limits.
Both lungs are clear. The visualized skeletal structures are
unremarkable. Rounded opacity on the lateral view may represent
pulmonary vascular structure.
IMPRESSION: No active cardiopulmonary disease. Rounded opacity on the lateral
view suspected to represent pulmonary vascular structure as opposed
to nodule. Recommend short interval radiographic follow-up.

## 2017-04-12 NOTE — ED Notes (Signed)
Pt c/o mid sternal cp and middle back pain  Sharp tightness w breathing,  When coughing pain is 1010,  When not coughing pain is 5/10

## 2017-04-12 NOTE — ED Triage Notes (Signed)
Pt c/o central chest pain with deep coughing and breathing, seen here 2 weeks ago fro DX pneumonia

## 2017-04-12 NOTE — ED Provider Notes (Signed)
[facility] Provider Note: Dana Patterson Allyce Bochicchio, MD, FACEP  CSN: [id number] MRN: [id number] ARRIVAL: 04/12/17 at 2248 ROOM: [facility]   CHIEF COMPLAINT  Chest Pain   HISTORY OF PRESENT ILLNESS  Dana Patterson is a 41 y.o. female who was recently treated for community-acquired pneumonia on May 9. She was treated with Zithromax. She was also placed on potassium chloride extended release for treatment of hypokalemia with potassium of 2.7. She is here with pain in her chest when she coughs. This began this evening. She describes the pain as a sharp deep pain. She rates it as an 8 out of 10. She is not short of breath with it. She has also had a "fluttering" in her left upper chest for the past week or 2. She is not sure this is related to the pain when she coughs.    Past Medical History:  Diagnosis Date  . Allergy   . HTN (hypertension) 10/29/2015  . Hyperlipidemia     Past Surgical History:  Procedure Laterality Date  . ABDOMINAL HYSTERECTOMY      Family History  Problem Relation Age of Onset  . Heart disease Mother   . Kidney disease Mother   . Hypertension Mother   . Hypertension Father     Social History  Substance Use Topics  . Smoking status: Never Smoker  . Smokeless tobacco: Never Used  . Alcohol use No    Prior to Admission medications   Medication Sig Start Date End Date Taking? Authorizing Provider  azithromycin (ZITHROMAX) 250 MG tablet Take 1 tablet (250 mg total) by mouth daily. Take first 2 tablets together, then 1 every day until finished. 03/29/17   Linwood DibblesKnapp, Jon, MD  fluticasone (FLONASE) 50 MCG/ACT nasal spray PLACE 2 SPRAYS INTO BOTH NOSTRILS DAILY. 01/03/17   Donato SchultzLowne Chase, Yvonne R, DO  hydrochlorothiazide (HYDRODIURIL) 25 MG tablet TAKE 1 TABLET (25 MG TOTAL) BY MOUTH DAILY. 01/05/17   Donato SchultzLowne Chase, Yvonne R, DO  loratadine (CLARITIN) 10 MG tablet Take 1 tablet (10 mg total) by mouth daily. 10/29/15   Saguier, Ramon DredgeEdward, PA-C  potassium chloride SA  (K-DUR,KLOR-CON) 20 MEQ tablet Take 1 tablet (20 mEq total) by mouth daily. 03/29/17   Linwood DibblesKnapp, Jon, MD    Allergies Patient has no known allergies.   REVIEW OF SYSTEMS  Negative except as noted here or in the History of Present Illness.   PHYSICAL EXAMINATION  Initial Vital Signs Blood pressure (!) 160/104, pulse 88, temperature 98.2 F (36.8 C), temperature source Oral, resp. rate 20, height 5\' 2"  (1.575 m), weight 107 kg (236 lb), SpO2 100 %.  Examination General: Well-developed, well-nourished female in no acute distress; appearance consistent with age of record HENT: normocephalic; atraumatic Eyes: pupils equal, round and reactive to light; extraocular muscles intact Neck: supple Heart: regular rate and rhythm; no murmur; frequent PVCs Lungs: clear to auscultation bilaterally Chest: Mild left upper parasternal tenderness Abdomen: soft; nondistended; nontender; bowel sounds present Extremities: No deformity; full range of motion; pulses normal Neurologic: Awake, alert and oriented; motor function intact in all extremities and symmetric; no facial droop Skin: Warm and dry Psychiatric: Normal mood and affect   RESULTS  Summary of this visit's results, reviewed by myself:   EKG Interpretation  Date/Time:  Wednesday Apr 12 2017 23:18:04 EDT Ventricular Rate:  76 PR Interval:    QRS Duration: 92 QT Interval:  371 QTC Calculation: 418 R Axis:   56 Text Interpretation:  Sinus rhythm Ventricular premature complex PVCs not  seen previously Confirmed by Paula Libra ([id number]) on 04/12/2017 11:27:38 PM      Laboratory Studies: Results for orders placed or performed during the hospital encounter of 04/12/17 (from the past 24 hour(s))  Basic metabolic panel     Status: Abnormal   Collection Time: 04/12/17 11:23 PM  Result Value Ref Range   Sodium 137 135 - 145 mmol/L   Potassium 3.2 (L) 3.5 - 5.1 mmol/L   Chloride 101 101 - 111 mmol/L   CO2 27 22 - 32 mmol/L   Glucose, Bld 113  (H) 65 - 99 mg/dL   BUN 11 6 - 20 mg/dL   Creatinine, Ser 4.09 0.44 - 1.00 mg/dL   Calcium 9.4 8.9 - 81.1 mg/dL   GFR calc non Af Amer >60 >60 mL/min   GFR calc Af Amer >60 >60 mL/min   Anion gap 9 5 - 15   Imaging Studies: Dg Chest 2 View  Result Date: 04/13/2017 CLINICAL DATA:  Central chest pain EXAM: CHEST  2 VIEW COMPARISON:  03/29/2017 FINDINGS: The heart size and mediastinal contours are within normal limits. Both lungs are clear. The visualized skeletal structures are unremarkable. Rounded opacity on the lateral view may represent pulmonary vascular structure. IMPRESSION: No active cardiopulmonary disease. Rounded opacity on the lateral view suspected to represent pulmonary vascular structure as opposed to nodule. Recommend short interval radiographic follow-up. Electronically Signed   By: Jasmine Pang M.D.   On: 04/13/2017 00:04    ED COURSE  Nursing notes and initial vitals signs, including pulse oximetry, reviewed.  Vitals:   04/12/17 2255 04/12/17 2257  BP:  (!) 160/104  Pulse:  88  Resp:  20  Temp:  98.2 F (36.8 C)  TempSrc:  Oral  SpO2:  100%  Weight: 107 kg (236 lb)   Height: 5\' 2"  (1.575 m)    12:10 AM The patient no longer has a PCP but is still taking her hydrochlorothiazide. She is requesting a refill of her potassium. We will have her follow-up with cardiology regarding her PVCs. The pain with coughing is likely pleuritic in nature and she was advised to take an over-the-counter NSAID for this.  PROCEDURES    ED DIAGNOSES     ICD-9-CM ICD-10-CM   1. PVCs (premature ventricular contractions) 427.69 I49.3   2. Hypokalemia 276.8 E87.6   3. Chest wall pain 786.52 R07.89        Paula Libra, MD 04/13/17 [phone]

## 2017-04-13 MED ORDER — NAPROXEN 250 MG PO TABS
500.0000 mg | ORAL_TABLET | Freq: Once | ORAL | Status: AC
  Administered 2017-04-13: 500 mg via ORAL
  Filled 2017-04-13: qty 2

## 2017-04-13 MED ORDER — POTASSIUM CHLORIDE CRYS ER 20 MEQ PO TBCR: 20.0000 meq | EXTENDED_RELEASE_TABLET | Freq: Every day | ORAL | 1 refills | Status: DC

## 2017-04-13 MED ORDER — POTASSIUM CHLORIDE CRYS ER 20 MEQ PO TBCR
40.0000 meq | EXTENDED_RELEASE_TABLET | Freq: Once | ORAL | Status: AC
  Administered 2017-04-13: 40 meq via ORAL
  Filled 2017-04-13: qty 2

## 2017-04-18 ENCOUNTER — Encounter: Payer: Self-pay | Admitting: Family Medicine

## 2017-04-18 ENCOUNTER — Ambulatory Visit (INDEPENDENT_AMBULATORY_CARE_PROVIDER_SITE_OTHER): Payer: Self-pay | Admitting: Family Medicine

## 2017-04-18 VITALS — BP 110/78 | HR 91 | Temp 98.0°F | Resp 16 | Ht 62.0 in | Wt 227.2 lb

## 2017-04-18 DIAGNOSIS — I10 Essential (primary) hypertension: Secondary | ICD-10-CM

## 2017-04-18 DIAGNOSIS — I493 Ventricular premature depolarization: Secondary | ICD-10-CM

## 2017-04-18 DIAGNOSIS — E876 Hypokalemia: Secondary | ICD-10-CM

## 2017-04-18 DIAGNOSIS — Z889 Allergy status to unspecified drugs, medicaments and biological substances status: Secondary | ICD-10-CM

## 2017-04-18 MED ORDER — POTASSIUM CHLORIDE CRYS ER 20 MEQ PO TBCR: 40.0000 meq | EXTENDED_RELEASE_TABLET | Freq: Every day | ORAL | 1 refills | Status: DC

## 2017-04-18 MED ORDER — HYDROCHLOROTHIAZIDE 25 MG PO TABS
25.0000 mg | ORAL_TABLET | Freq: Every day | ORAL | 0 refills | Status: DC
Start: 2017-04-18 — End: 2017-08-23

## 2017-04-18 MED ORDER — LORATADINE 10 MG PO TABS: 10.0000 mg | ORAL_TABLET | Freq: Every day | ORAL | 11 refills | Status: DC

## 2017-04-18 MED ORDER — FLUTICASONE PROPIONATE 50 MCG/ACT NA SUSP: 2.0000 | Freq: Every day | NASAL | 0 refills | Status: DC

## 2017-04-18 NOTE — Progress Notes (Signed)
Patient ID: Dana Patterson, female   DOB: 1976/02/11, 41 y.o.   MRN: [id number]     Subjective:  I acted as a Neurosurgeon for Dr. Zola Button.  Apolonio Schneiders, CMA   Patient ID: Dana Patterson, female    DOB: 05-10-1976, 41 y.o.   MRN: [id number]  Chief Complaint  Patient presents with  . Hospitalization Follow-up    04/12/17    HPI  Patient is in today for hospital follow up palpitations.  She states she is still having the pvcs.    Patient Care Team: Zola Button, Grayling Congress, DO as PCP - General (Family Medicine)   Past Medical History:  Diagnosis Date  . Allergy   . HTN (hypertension) 10/29/2015  . Hyperlipidemia     Past Surgical History:  Procedure Laterality Date  . ABDOMINAL HYSTERECTOMY      Family History  Problem Relation Age of Onset  . Heart disease Mother   . Kidney disease Mother   . Hypertension Mother   . Hypertension Father     Social History   Social History  . Marital status: Married    Spouse name: N/A  . Number of children: N/A  . Years of education: N/A   Occupational History  . Not on file.   Social History Main Topics  . Smoking status: Never Smoker  . Smokeless tobacco: Never Used  . Alcohol use No  . Drug use: No  . Sexual activity: Yes    Birth control/ protection: Surgical   Other Topics Concern  . Not on file   Social History Narrative  . No narrative on file    Outpatient Medications Prior to Visit  Medication Sig Dispense Refill  . fluticasone (FLONASE) 50 MCG/ACT nasal spray PLACE 2 SPRAYS INTO BOTH NOSTRILS DAILY. 16 g 0  . hydrochlorothiazide (HYDRODIURIL) 25 MG tablet TAKE 1 TABLET (25 MG TOTAL) BY MOUTH DAILY. 90 tablet 0  . loratadine (CLARITIN) 10 MG tablet Take 1 tablet (10 mg total) by mouth daily. 30 tablet 11  . potassium chloride SA (K-DUR,KLOR-CON) 20 MEQ tablet Take 1 tablet (20 mEq total) by mouth daily. 14 tablet 1  . azithromycin (ZITHROMAX) 250 MG tablet Take 1 tablet (250 mg total) by mouth daily. Take first 2  tablets together, then 1 every day until finished. 6 tablet 0   No facility-administered medications prior to visit.     No Known Allergies  Review of Systems  Constitutional: Negative for fever and malaise/fatigue.  HENT: Negative for congestion.   Eyes: Negative for blurred vision.  Respiratory: Negative for cough and shortness of breath.   Cardiovascular: Positive for palpitations. Negative for chest pain and leg swelling.  Gastrointestinal: Negative for vomiting.  Musculoskeletal: Negative for back pain.  Skin: Negative for rash.  Neurological: Negative for loss of consciousness and headaches.       Objective:    Physical Exam  Constitutional: She is oriented to person, place, and time. She appears well-developed and well-nourished. No distress.  HENT:  Head: Normocephalic and atraumatic.  Eyes: Conjunctivae are normal.  Neck: Normal range of motion. No thyromegaly present.  Cardiovascular: Normal rate and regular rhythm.   Pulmonary/Chest: Effort normal and breath sounds normal. She has no wheezes.  Abdominal: Soft. Bowel sounds are normal. There is no tenderness.  Musculoskeletal: Normal range of motion. She exhibits no edema or deformity.  Neurological: She is alert and oriented to person, place, and time.  Skin: Skin is warm and dry. She is not diaphoretic.  Psychiatric: She has a normal mood and affect.    BP 110/78 (BP Location: Left Arm, Cuff Size: Large)   Pulse 91   Temp 98 F (36.7 C) (Oral)   Resp 16   Ht 5\' 2"  (1.575 m)   Wt 227 lb 3.2 oz (103.1 kg)   SpO2 98%   BMI 41.56 kg/m  Wt Readings from Last 3 Encounters:  04/18/17 227 lb 3.2 oz (103.1 kg)  04/12/17 236 lb (107 kg)  03/29/17 227 lb (103 kg)   BP Readings from Last 3 Encounters:  04/18/17 110/78  04/12/17 (!) 160/104  03/29/17 121/77     Immunization History  Administered Date(s) Administered  . Tdap 04/22/2015    Health Maintenance  Topic Date Due  . HIV Screening  12/19/1988    . INFLUENZA VACCINE  06/21/2017  . TETANUS/TDAP  04/21/2025    Lab Results  Component Value Date   WBC 7.8 03/29/2017   HGB 12.1 03/29/2017   HCT 34.6 (L) 03/29/2017   PLT 237 03/29/2017   GLUCOSE 113 (H) 04/12/2017   CHOL 208 (H) 05/03/2016   TRIG 121.0 05/03/2016   HDL 53.30 05/03/2016   LDLCALC 130 (H) 05/03/2016   ALT 28 11/24/2016   AST 23 11/24/2016   NA 137 04/12/2017   K 3.2 (L) 04/12/2017   CL 101 04/12/2017   CREATININE 0.64 04/12/2017   BUN 11 04/12/2017   CO2 27 04/12/2017   TSH 1.200 11/17/2015   HGBA1C 5.8 05/03/2016   MICROALBUR <0.7 05/03/2016    Lab Results  Component Value Date   TSH 1.200 11/17/2015   Lab Results  Component Value Date   WBC 7.8 03/29/2017   HGB 12.1 03/29/2017   HCT 34.6 (L) 03/29/2017   MCV 88.9 03/29/2017   PLT 237 03/29/2017   Lab Results  Component Value Date   NA 137 04/12/2017   K 3.2 (L) 04/12/2017   CO2 27 04/12/2017   GLUCOSE 113 (H) 04/12/2017   BUN 11 04/12/2017   CREATININE 0.64 04/12/2017   BILITOT 0.3 11/24/2016   ALKPHOS 70 11/24/2016   AST 23 11/24/2016   ALT 28 11/24/2016   PROT 7.8 11/24/2016   ALBUMIN 4.3 11/24/2016   CALCIUM 9.4 04/12/2017   ANIONGAP 9 04/12/2017   GFR 121.81 05/03/2016   Lab Results  Component Value Date   CHOL 208 (H) 05/03/2016   Lab Results  Component Value Date   HDL 53.30 05/03/2016   Lab Results  Component Value Date   LDLCALC 130 (H) 05/03/2016   Lab Results  Component Value Date   TRIG 121.0 05/03/2016   Lab Results  Component Value Date   CHOLHDL 4 05/03/2016   Lab Results  Component Value Date   HGBA1C 5.8 05/03/2016         Assessment & Plan:   Problem List Items Addressed This Visit      Unprioritized   Hypokalemia   Relevant Medications   potassium chloride SA (K-DUR,KLOR-CON) 20 MEQ tablet   HTN (hypertension)   Relevant Medications   hydrochlorothiazide (HYDRODIURIL) 25 MG tablet   Other Relevant Orders   Basic Metabolic Panel  (BMET)    Other Visit Diagnoses    PVC (premature ventricular contraction)    -  Primary   Relevant Medications   hydrochlorothiazide (HYDRODIURIL) 25 MG tablet   Other Relevant Orders   Ambulatory referral to Cardiology   H/O seasonal allergies       Relevant Medications   loratadine (  CLARITIN) 10 MG tablet   fluticasone (FLONASE) 50 MCG/ACT nasal spray      I have discontinued Ms. Mariani's azithromycin. I am also having her maintain her loratadine, fluticasone, hydrochlorothiazide, and potassium chloride SA.  Meds ordered this encounter  Medications  . loratadine (CLARITIN) 10 MG tablet    Sig: Take 1 tablet (10 mg total) by mouth daily.    Dispense:  30 tablet    Refill:  11  . fluticasone (FLONASE) 50 MCG/ACT nasal spray    Sig: Place 2 sprays into both nostrils daily.    Dispense:  16 g    Refill:  0    PATIENT WILL NEED TO BE SEEN BY THE PROVIDER BEFORE ANY ADDITIONAL WILL BE GIVEN.  . hydrochlorothiazide (HYDRODIURIL) 25 MG tablet    Sig: Take 1 tablet (25 mg total) by mouth daily.    Dispense:  90 tablet    Refill:  0  . DISCONTD: potassium chloride SA (K-DUR,KLOR-CON) 20 MEQ tablet    Sig: Take 2 tablets (40 mEq total) by mouth daily.    Dispense:  60 tablet    Refill:  1  . potassium chloride SA (K-DUR,KLOR-CON) 20 MEQ tablet    Sig: Take 2 tablets (40 mEq total) by mouth daily.    Dispense:  60 tablet    Refill:  1    CMA served as scribe during this visit. History, Physical and Plan performed by medical provider. Documentation and orders reviewed and attested to.  Donato SchultzYvonne R Lowne Chase, DO

## 2017-04-18 NOTE — Patient Instructions (Signed)
Palpitations A palpitation is the feeling that your heartbeat is irregular or is faster than normal. It may feel like your heart is fluttering or skipping a beat. Palpitations are usually not a serious problem. They may be caused by many things, including smoking, caffeine, alcohol, stress, and certain medicines. Although most causes of palpitations are not serious, palpitations can be a sign of a serious medical problem. In some cases, you may need further medical evaluation. Follow these instructions at home: Pay attention to any changes in your symptoms. Take these actions to help with your condition:  Avoid the following: ? Caffeinated coffee, tea, soft drinks, diet pills, and energy drinks. ? Chocolate. ? Alcohol.  Do not use any tobacco products, such as cigarettes, chewing tobacco, and e-cigarettes. If you need help quitting, ask your health care provider.  Try to reduce your stress and anxiety. Things that can help you relax include: ? Yoga. ? Meditation. ? Physical activity, such as swimming, jogging, or walking. ? Biofeedback. This is a method that helps you learn to use your mind to control things in your body, such as your heartbeats.  Get plenty of rest and sleep.  Take over-the-counter and prescription medicines only as told by your health care provider.  Keep all follow-up visits as told by your health care provider. This is important.  Contact a health care provider if:  You continue to have a fast or irregular heartbeat after 24 hours.  Your palpitations occur more often. Get help right away if:  You have chest pain or shortness of breath.  You have a severe headache.  You feel dizzy or you faint. This information is not intended to replace advice given to you by your health care provider. Make sure you discuss any questions you have with your health care provider. Document Released: 11/04/2000 Document Revised: 04/11/2016 Document Reviewed: 07/23/2015 Elsevier  Interactive Patient Education  2017 Elsevier Inc.  

## 2017-04-19 ENCOUNTER — Other Ambulatory Visit (INDEPENDENT_AMBULATORY_CARE_PROVIDER_SITE_OTHER): Payer: Self-pay

## 2017-04-19 DIAGNOSIS — I10 Essential (primary) hypertension: Secondary | ICD-10-CM

## 2017-04-19 LAB — BASIC METABOLIC PANEL
BUN: 12 mg/dL (ref 6–23)
CHLORIDE: 104 meq/L (ref 96–112)
CO2: 24 meq/L (ref 19–32)
Calcium: 9.8 mg/dL (ref 8.4–10.5)
Creatinine, Ser: 0.7 mg/dL (ref 0.40–1.20)
GFR: 117.27 mL/min (ref >60.00)
GLUCOSE: 91 mg/dL (ref 70–99)
POTASSIUM: 4 meq/L (ref 3.5–5.1)
Sodium: 136 mEq/L (ref 135–145)

## 2017-04-26 ENCOUNTER — Ambulatory Visit: Payer: Self-pay | Admitting: Student

## 2017-05-05 ENCOUNTER — Ambulatory Visit: Payer: Self-pay | Admitting: Physician Assistant

## 2017-07-13 ENCOUNTER — Other Ambulatory Visit: Payer: Self-pay | Admitting: Family Medicine

## 2017-07-13 DIAGNOSIS — I10 Essential (primary) hypertension: Secondary | ICD-10-CM

## 2017-07-13 NOTE — Telephone Encounter (Signed)
Patient was instructed in May to sched F/U/denied/thx dmf

## 2017-08-01 ENCOUNTER — Emergency Department (HOSPITAL_BASED_OUTPATIENT_CLINIC_OR_DEPARTMENT_OTHER)
Admission: EM | Admit: 2017-08-01 | Discharge: 2017-08-02 | Disposition: A | Discharge status: Home / Self Care | Payer: BLUE CROSS/BLUE SHIELD | Attending: Emergency Medicine | Admitting: Emergency Medicine

## 2017-08-01 ENCOUNTER — Emergency Department (HOSPITAL_BASED_OUTPATIENT_CLINIC_OR_DEPARTMENT_OTHER): Payer: BLUE CROSS/BLUE SHIELD

## 2017-08-01 ENCOUNTER — Encounter (HOSPITAL_BASED_OUTPATIENT_CLINIC_OR_DEPARTMENT_OTHER): Payer: Self-pay | Admitting: *Deleted

## 2017-08-01 DIAGNOSIS — B9789 Other viral agents as the cause of diseases classified elsewhere: Secondary | ICD-10-CM

## 2017-08-01 DIAGNOSIS — I10 Essential (primary) hypertension: Secondary | ICD-10-CM | POA: Diagnosis not present

## 2017-08-01 DIAGNOSIS — R05 Cough: Secondary | ICD-10-CM | POA: Diagnosis present

## 2017-08-01 DIAGNOSIS — J069 Acute upper respiratory infection, unspecified: Secondary | ICD-10-CM | POA: Diagnosis not present

## 2017-08-01 DIAGNOSIS — Z79899 Other long term (current) drug therapy: Secondary | ICD-10-CM | POA: Diagnosis not present

## 2017-08-01 IMAGING — CR DG CHEST 2V
2 series · 2 of 2 positions shown · non-contrast
Comparison: Chest radiograph [DATE]

CLINICAL DATA: Nonproductive cough for 3 days.

EXAM:
CHEST  2 VIEW

[w chest pa]
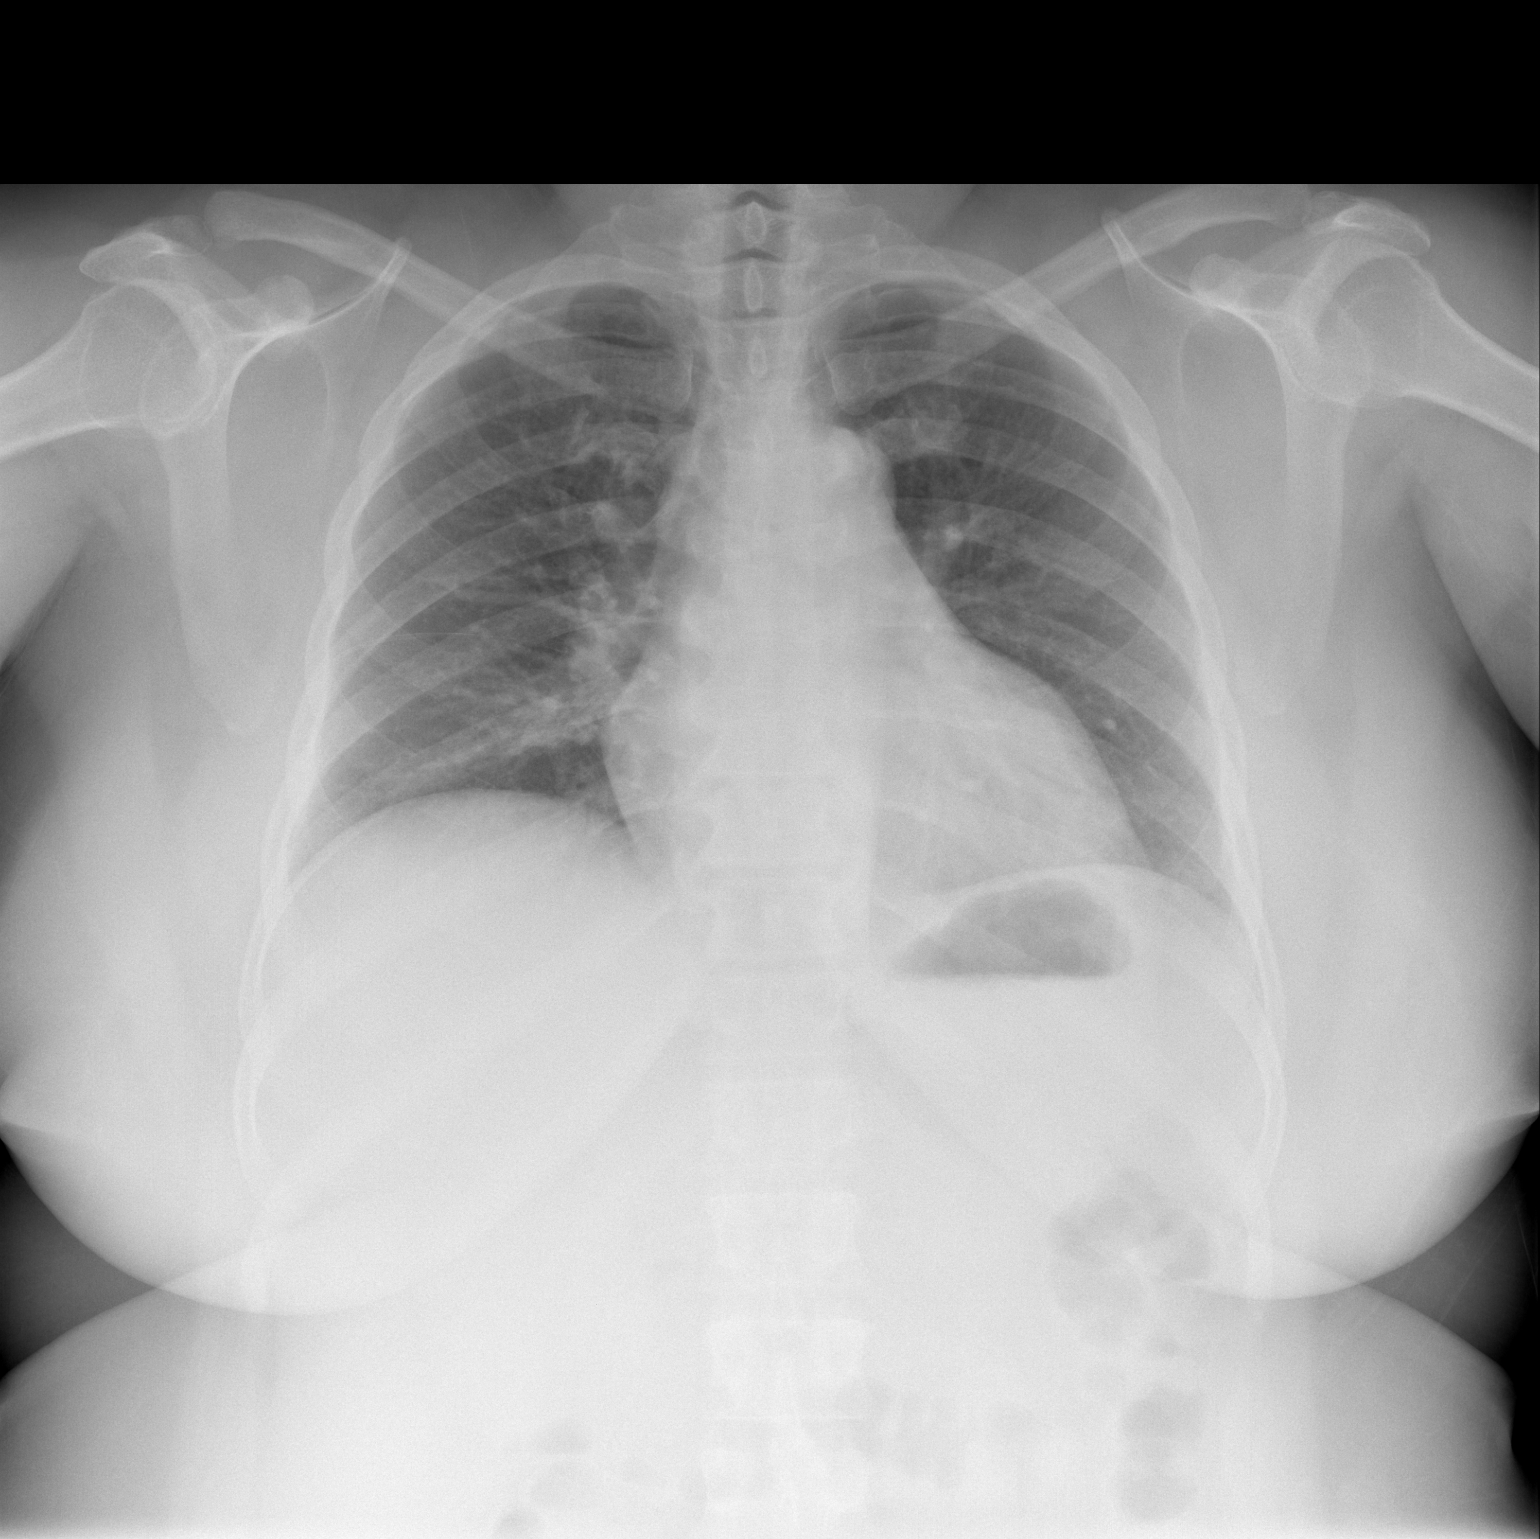

[w chest lat]
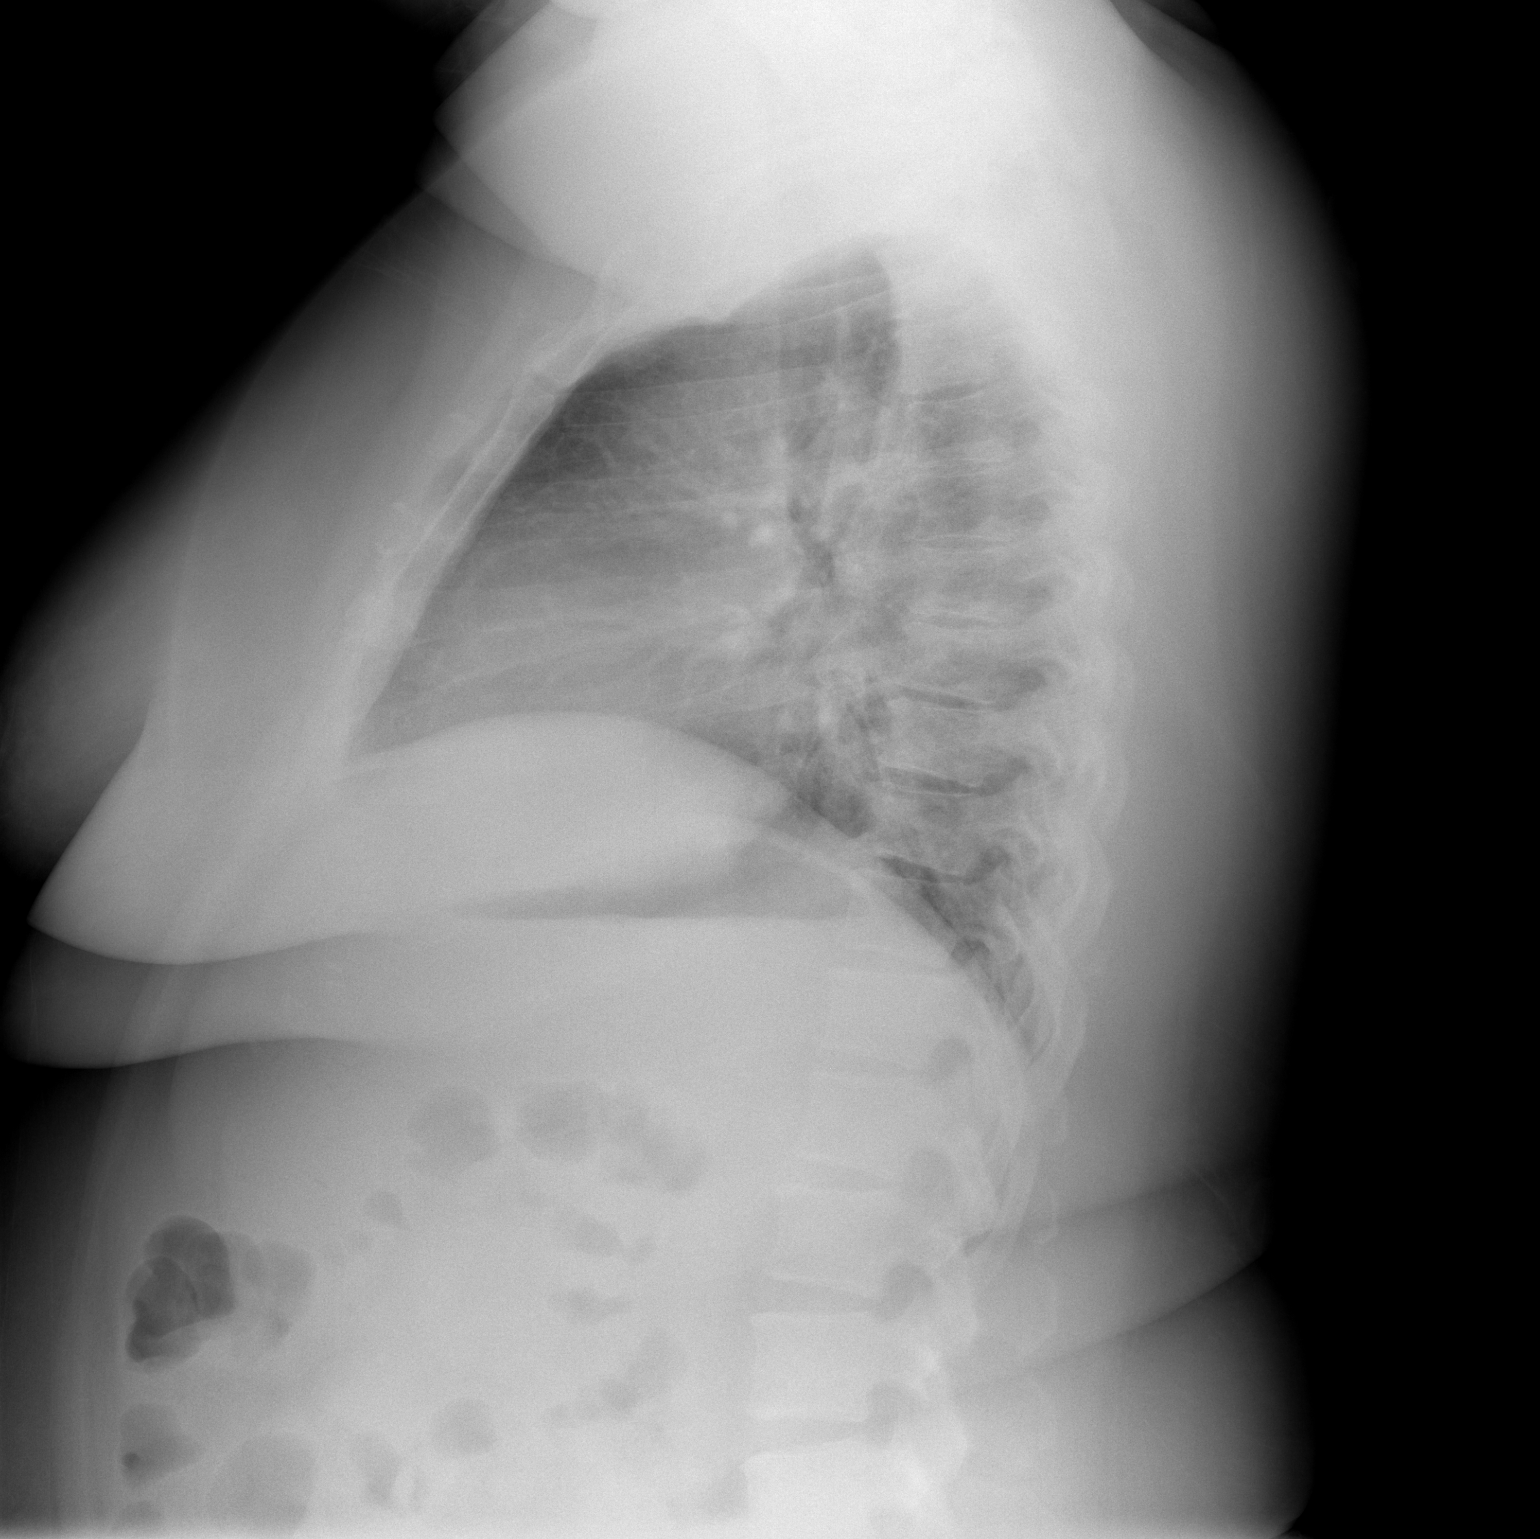

[2 of 2 positions shown; findings below may reference images not displayed]

FINDINGS: Low lung volumes. The cardiomediastinal contours are normal. Rounded
density on the lateral view on prior exam is no longer present. No
consolidation, pleural effusion, or pneumothorax. No acute osseous
abnormalities are seen.
IMPRESSION: Hypoventilatory chest.  No evidence of acute abnormality.

## 2017-08-01 MED ORDER — BENZONATATE 100 MG PO CAPS
100.0000 mg | ORAL_CAPSULE | Freq: Once | ORAL | Status: AC
  Administered 2017-08-01: 100 mg via ORAL
  Filled 2017-08-01: qty 1

## 2017-08-01 MED ORDER — IBUPROFEN 400 MG PO TABS
600.0000 mg | ORAL_TABLET | Freq: Once | ORAL | Status: AC
  Administered 2017-08-01: 600 mg via ORAL
  Filled 2017-08-01: qty 1

## 2017-08-01 NOTE — ED Triage Notes (Signed)
Pt c/o URI symptoms x 3 days 

## 2017-08-01 NOTE — ED Notes (Addendum)
Pt c/o dry persistent cough x 3 days. Pt denies runny nose, fever, or sore throat. Chest wall tender to palpation along sternal boarder.

## 2017-08-02 MED ORDER — BENZONATATE 100 MG PO CAPS: 100.0000 mg | ORAL_CAPSULE | Freq: Three times a day (TID) | ORAL | 0 refills | Status: DC

## 2017-08-02 MED ORDER — FLUTICASONE PROPIONATE 50 MCG/ACT NA SUSP: 2.0000 | Freq: Every day | NASAL | 2 refills | Status: DC

## 2017-08-02 MED ORDER — IBUPROFEN 600 MG PO TABS: 600.0000 mg | ORAL_TABLET | Freq: Four times a day (QID) | ORAL | 0 refills | Status: DC | PRN

## 2017-08-02 NOTE — ED Provider Notes (Signed)
[facility]   CSN: [id number] Arrival date & time: 08/01/17  2237     History   Chief Complaint Chief Complaint  Patient presents with  . URI    HPI Dana Patterson is a 41 y.o. female.  HPI  This 41 year old female with a history of hypertension and allergies who presents with cough and chest congestion. Patient reports 3 day history of nonproductive cough. No fevers. She reports pressure with coughing.  Denies any nasal congestion, ear pain, sore throat. She has not taken anything for her symptoms. Current chest discomfort is 4 out of 10. Much worse with coughing. No history of smoking or asthma.  Past Medical History:  Diagnosis Date  . Allergy   . HTN (hypertension) 10/29/2015  . Hyperlipidemia     Patient Active Problem List   Diagnosis Date Noted  . Morbid obesity (HCC) 01/14/2016  . Hypokalemia 01/14/2016  . Obstructive sleep apnea 01/14/2016  . Palpitations 01/14/2016  . Metabolic syndrome 01/14/2016  . Left hip pain 12/09/2015  . HTN (hypertension) 10/29/2015  . Allergic rhinitis 10/29/2015  . Pain in joint, shoulder region 10/29/2015  . Neck pain 10/29/2015    Past Surgical History:  Procedure Laterality Date  . ABDOMINAL HYSTERECTOMY      OB History    No data available       Home Medications    Prior to Admission medications   Medication Sig Start Date End Date Taking? Authorizing Provider  benzonatate (TESSALON) 100 MG capsule Take 1 capsule (100 mg total) by mouth every 8 (eight) hours. 08/02/17   Horton, Mayer Masker, MD  fluticasone (FLONASE) 50 MCG/ACT nasal spray Place 2 sprays into both nostrils daily. 04/18/17   Seabron Spates R, DO  fluticasone (FLONASE) 50 MCG/ACT nasal spray Place 2 sprays into both nostrils daily. 08/02/17   Horton, Mayer Masker, MD  hydrochlorothiazide (HYDRODIURIL) 25 MG tablet Take 1 tablet (25 mg total) by mouth daily. 04/18/17   Donato Schultz, DO  ibuprofen (ADVIL,MOTRIN) 600 MG  tablet Take 1 tablet (600 mg total) by mouth every 6 (six) hours as needed. 08/02/17   Horton, Mayer Masker, MD  loratadine (CLARITIN) 10 MG tablet Take 1 tablet (10 mg total) by mouth daily. 04/18/17   Donato Schultz, DO  potassium chloride SA (K-DUR,KLOR-CON) 20 MEQ tablet Take 2 tablets (40 mEq total) by mouth daily. 04/18/17   Donato Schultz, DO    Family History Family History  Problem Relation Age of Onset  . Heart disease Mother   . Kidney disease Mother   . Hypertension Mother   . Hypertension Father     Social History Social History  Substance Use Topics  . Smoking status: Never Smoker  . Smokeless tobacco: Never Used  . Alcohol use No     Allergies   Patient has no known allergies.   Review of Systems Review of Systems  Constitutional: Negative for fever.  Respiratory: Positive for cough. Negative for shortness of breath.   Cardiovascular: Positive for chest pain.  Gastrointestinal: Negative for abdominal pain, nausea and vomiting.  All other systems reviewed and are negative.    Physical Exam Updated Vital Signs BP (!) 170/89   Pulse 89   Temp 98.9 F (37.2 C) (Oral)   Resp 16   Ht  (1.575 m)   Wt 104.3 kg (230 lb)   SpO2 100%   BMI 42.07 kg/m   Physical Exam  Constitutional: She  is oriented to person, place, and time. She appears well-developed and well-nourished. No distress.  Obese  HENT:  Head: Normocephalic and atraumatic.  Cardiovascular: Normal rate, regular rhythm and normal heart sounds.   No murmur heard. Pulmonary/Chest: Effort normal and breath sounds normal. No respiratory distress. She has no wheezes. She exhibits tenderness.  Tenderness palpation anterior chest wall, no crepitus  Musculoskeletal: She exhibits no edema.  Neurological: She is alert and oriented to person, place, and time.  Skin: Skin is warm and dry.  Psychiatric: She has a normal mood and affect.  Nursing note and vitals reviewed.    ED  Treatments / Results  Labs (all labs ordered are listed, but only abnormal results are displayed) Labs Reviewed - No data to display  EKG  EKG Interpretation None       Radiology Dg Chest 2 View  Result Date: 08/02/2017 CLINICAL DATA:  Nonproductive cough for 3 days. EXAM: CHEST  2 VIEW COMPARISON:  Chest radiograph 04/12/2017 FINDINGS: Low lung volumes. The cardiomediastinal contours are normal. Rounded density on the lateral view on prior exam is no longer present. No consolidation, pleural effusion, or pneumothorax. No acute osseous abnormalities are seen. IMPRESSION: Hypoventilatory chest.  No evidence of acute abnormality. Electronically Signed   By: Rubye OaksMelanie  Ehinger M.D.   On: 08/02/2017 00:12    Procedures Procedures (including critical care time)  Medications Ordered in ED Medications  benzonatate (TESSALON) capsule 100 mg (100 mg Oral Given 08/01/17 2338)  ibuprofen (ADVIL,MOTRIN) tablet 600 mg (600 mg Oral Given 08/01/17 2339)     Initial Impression / Assessment and Plan / ED Course  I have reviewed the triage vital signs and the nursing notes.  Pertinent labs & imaging results that were available during my care of the patient were reviewed by me and considered in my medical decision making (see chart for details).     Patient presents with cough and associated chest pain. She is nontoxic on exam. Chest pain is reproducible and likely related to inflammation. Pulmonary exam is reassuring. Patient is in no acute distress and O2 sats 100% on room air. Patient was given Tessalon Perles and ibuprofen for likely chest wall pain. Chest x-ray shows no evidence of pneumonia. Suspect viral versus allergic etiology. Recommend supportive measures including ibuprofen, Tessalon Perles, and Flonase.  After history, exam, and medical workup I feel the patient has been appropriately medically screened and is safe for discharge home. Pertinent diagnoses were discussed with the patient.  Patient was given return precautions.   Final Clinical Impressions(s) / ED Diagnoses   Final diagnoses:  Viral URI with cough    New Prescriptions New Prescriptions   BENZONATATE (TESSALON) 100 MG CAPSULE    Take 1 capsule (100 mg total) by mouth every 8 (eight) hours.   FLUTICASONE (FLONASE) 50 MCG/ACT NASAL SPRAY    Place 2 sprays into both nostrils daily.   IBUPROFEN (ADVIL,MOTRIN) 600 MG TABLET    Take 1 tablet (600 mg total) by mouth every 6 (six) hours as needed.     Shon BatonHorton, Courtney F, MD 08/02/17 [phone]

## 2017-08-23 ENCOUNTER — Encounter: Payer: Self-pay | Admitting: Family Medicine

## 2017-08-23 ENCOUNTER — Ambulatory Visit (INDEPENDENT_AMBULATORY_CARE_PROVIDER_SITE_OTHER): Payer: BLUE CROSS/BLUE SHIELD | Admitting: Family Medicine

## 2017-08-23 VITALS — BP 118/82 | HR 78 | Temp 98.6°F | Ht 62.0 in | Wt 234.4 lb

## 2017-08-23 DIAGNOSIS — R42 Dizziness and giddiness: Secondary | ICD-10-CM | POA: Diagnosis not present

## 2017-08-23 DIAGNOSIS — I10 Essential (primary) hypertension: Secondary | ICD-10-CM

## 2017-08-23 DIAGNOSIS — E876 Hypokalemia: Secondary | ICD-10-CM

## 2017-08-23 LAB — CBC
HEMATOCRIT: 39.1 % (ref 36.0–46.0)
Hemoglobin: 13.2 g/dL (ref 12.0–15.0)
MCHC: 33.7 g/dL (ref 30.0–36.0)
MCV: 90.1 fl (ref 78.0–100.0)
Platelets: 261 10*3/uL (ref 150.0–400.0)
RBC: 4.35 Mil/uL (ref 3.87–5.11)
RDW: 13.5 % (ref 11.5–15.5)
WBC: 5 10*3/uL (ref 4.0–10.5)

## 2017-08-23 LAB — COMPREHENSIVE METABOLIC PANEL
ALT: 29 U/L (ref 0–35)
AST: 17 U/L (ref 0–37)
Albumin: 4.4 g/dL (ref 3.5–5.2)
Alkaline Phosphatase: 77 U/L (ref 39–117)
BUN: 10 mg/dL (ref 6–23)
CHLORIDE: 101 meq/L (ref 96–112)
CO2: 28 meq/L (ref 19–32)
Calcium: 9.8 mg/dL (ref 8.4–10.5)
Creatinine, Ser: 0.66 mg/dL (ref 0.40–1.20)
GFR: 125.31 mL/min (ref >60.00)
GLUCOSE: 94 mg/dL (ref 70–99)
POTASSIUM: 3.9 meq/L (ref 3.5–5.1)
SODIUM: 137 meq/L (ref 135–145)
Total Bilirubin: 0.4 mg/dL (ref 0.2–1.2)
Total Protein: 7.6 g/dL (ref 6.0–8.3)

## 2017-08-23 MED ORDER — POTASSIUM CHLORIDE CRYS ER 20 MEQ PO TBCR: 40.0000 meq | EXTENDED_RELEASE_TABLET | Freq: Every day | ORAL | 0 refills | Status: DC

## 2017-08-23 MED ORDER — HYDROCHLOROTHIAZIDE 25 MG PO TABS: 25.0000 mg | ORAL_TABLET | Freq: Every day | ORAL | 0 refills | Status: DC

## 2017-08-23 MED ORDER — FLUTICASONE PROPIONATE 50 MCG/ACT NA SUSP: 2.0000 | Freq: Every day | NASAL | 0 refills | Status: DC

## 2017-08-23 NOTE — Patient Instructions (Addendum)
Claritin (loratadine), Allegra (fexofenadine), Zyrtec (cetirizine); these are listed in order from weakest to strongest. Generic, and therefore cheaper, options are in the parentheses.   Keep using Flonase.  There are available OTC, and the generic versions, which may be cheaper, are in parentheses. Show this to a pharmacist if you have trouble finding any of these items.  Keep drinking lots of fluids. Sports drinks as long as you are having diarrhea.   Give [place] 2-3 business days to get the results of your labs back.  Call/send MyChart message on Friday if you are still having watery stools. We will order a stool culture.  Let us know if you need anything.

## 2017-08-23 NOTE — Progress Notes (Signed)
Pre visit review using our clinic review tool, if applicable. No additional management support is needed unless otherwise documented below in the visit note. 

## 2017-08-23 NOTE — Progress Notes (Signed)
Chief Complaint  Patient presents with  . Dizziness  . Numbness    left leg    Dana Patterson is 41 y.o. pt here for dizziness.  Duration: 2 days Pass out? No Spinning? No Recent illness/fever? No Headache? Yes Neurologic signs? Yes - numbness in L leg, denies weakness Change in PO intake? No  +Sinus congestion/pressure +loose stools for 3 weeks, (Type 6), some Type 7 yesterday, no bleeding +L ankle pain She works in warm environment and is sweating a lot.  ROS:  Neuro: As noted in HPI Eyes: No vision changes  Past Medical History:  Diagnosis Date  . Allergy   . HTN (hypertension) 10/29/2015  . Hyperlipidemia     Family History  Problem Relation Age of Onset  . Heart disease Mother   . Kidney disease Mother   . Hypertension Mother   . Hypertension Father     Allergies as of 08/23/2017   No Known Allergies     Medication List       Accurate as of 08/23/17  8:25 AM. Always use your most recent med list.          fluticasone 50 MCG/ACT nasal spray Commonly known as:  FLONASE Place 2 sprays into both nostrils daily.   hydrochlorothiazide 25 MG tablet Commonly known as:  HYDRODIURIL Take 1 tablet (25 mg total) by mouth daily.   ibuprofen 600 MG tablet Commonly known as:  ADVIL,MOTRIN Take 1 tablet (600 mg total) by mouth every 6 (six) hours as needed.   loratadine 10 MG tablet Commonly known as:  CLARITIN Take 1 tablet (10 mg total) by mouth daily.   potassium chloride SA 20 MEQ tablet Commonly known as:  K-DUR,KLOR-CON Take 2 tablets (40 mEq total) by mouth daily.       BP 118/82 (BP Location: Left Arm, Patient Position: Sitting, Cuff Size: Large)   Pulse 78   Temp 98.6 F (37 C) (Oral)   Ht  (1.575 m)   Wt 234 lb 6 oz (106.3 kg)   SpO2 97%   BMI 42.87 kg/m  General: Awake, alert, appears stated age Eyes: PERRLA, EOMi Ears: Patent, TM's neg b/l Heart: RRR, no murmurs, no carotid bruits Lungs: CTAB, no accessory muscle use MSK:  5/5 strength throughout, gait normal Abd: Soft, NT, ND, no masses or organomegaly Neuro: No cerebellar signs, patellar reflex 1/4 b/l wo clonus, calcaneal reflex 1/4 b/l wo clonus, biceps reflex 2/4 b/l wo clonus; Dix-Hall-Pike negative b/l. Psych: Age appropriate judgment and insight, normal mood and affect  Lightheadedness - Plan: CBC, Comprehensive metabolic panel  Essential hypertension - Plan: hydrochlorothiazide (HYDRODIURIL) 25 MG tablet  Hypokalemia - Plan: potassium chloride SA (K-DUR,KLOR-CON) 20 MEQ tablet  Orders as above. Will check labs, this is likely due to dehydration due to both diarrhea and sweating at work.  Sports drinks until diarrhea resolves. If it persists through Friday, call and we will order stool culture. Letter for work given. I do not think that the ankle pain is related. F/u with reg PCP for this issue if still bothering her.  F/u prn. Pt voiced understanding and agreement to the plan.  Jilda Roche [place], DO 08/23/17 8:25 AM

## 2017-09-25 ENCOUNTER — Other Ambulatory Visit: Payer: Self-pay | Admitting: Family Medicine

## 2017-09-25 DIAGNOSIS — I10 Essential (primary) hypertension: Secondary | ICD-10-CM

## 2017-09-25 DIAGNOSIS — Z889 Allergy status to unspecified drugs, medicaments and biological substances status: Secondary | ICD-10-CM

## 2017-11-02 ENCOUNTER — Other Ambulatory Visit: Payer: Self-pay | Admitting: Family Medicine

## 2017-11-02 DIAGNOSIS — E876 Hypokalemia: Secondary | ICD-10-CM

## 2017-11-29 ENCOUNTER — Other Ambulatory Visit: Payer: Self-pay | Admitting: *Deleted

## 2017-11-29 DIAGNOSIS — E876 Hypokalemia: Secondary | ICD-10-CM

## 2017-11-29 MED ORDER — POTASSIUM CHLORIDE CRYS ER 20 MEQ PO TBCR
40.0000 meq | EXTENDED_RELEASE_TABLET | Freq: Every day | ORAL | 0 refills | Status: DC
Start: 2017-11-29 — End: 2019-02-14

## 2017-12-01 ENCOUNTER — Other Ambulatory Visit: Payer: Self-pay | Admitting: Family Medicine

## 2017-12-01 DIAGNOSIS — I10 Essential (primary) hypertension: Secondary | ICD-10-CM

## 2017-12-01 MED ORDER — HYDROCHLOROTHIAZIDE 25 MG PO TABS: 25.0000 mg | ORAL_TABLET | Freq: Every day | ORAL | 0 refills | Status: DC

## 2017-12-05 ENCOUNTER — Other Ambulatory Visit: Payer: Self-pay | Admitting: Family Medicine

## 2017-12-05 DIAGNOSIS — I10 Essential (primary) hypertension: Secondary | ICD-10-CM

## 2017-12-21 ENCOUNTER — Emergency Department (HOSPITAL_BASED_OUTPATIENT_CLINIC_OR_DEPARTMENT_OTHER): Payer: BLUE CROSS/BLUE SHIELD

## 2017-12-21 ENCOUNTER — Emergency Department (HOSPITAL_BASED_OUTPATIENT_CLINIC_OR_DEPARTMENT_OTHER)
Admission: EM | Admit: 2017-12-21 | Discharge: 2017-12-21 | Disposition: A | Discharge status: Home / Self Care | Payer: BLUE CROSS/BLUE SHIELD | Attending: Physician Assistant | Admitting: Physician Assistant

## 2017-12-21 ENCOUNTER — Encounter (HOSPITAL_BASED_OUTPATIENT_CLINIC_OR_DEPARTMENT_OTHER): Payer: Self-pay

## 2017-12-21 DIAGNOSIS — J069 Acute upper respiratory infection, unspecified: Secondary | ICD-10-CM

## 2017-12-21 DIAGNOSIS — B9789 Other viral agents as the cause of diseases classified elsewhere: Secondary | ICD-10-CM | POA: Diagnosis not present

## 2017-12-21 DIAGNOSIS — R05 Cough: Secondary | ICD-10-CM | POA: Diagnosis present

## 2017-12-21 DIAGNOSIS — I10 Essential (primary) hypertension: Secondary | ICD-10-CM | POA: Insufficient documentation

## 2017-12-21 DIAGNOSIS — Z79899 Other long term (current) drug therapy: Secondary | ICD-10-CM | POA: Insufficient documentation

## 2017-12-21 IMAGING — DX DG CHEST 2V
2 series · 2 of 2 positions shown · non-contrast
Comparison: Chest radiograph [DATE]

CLINICAL DATA: Flu-like symptoms.  Productive cough.

EXAM:
CHEST  2 VIEW

[chest pa]
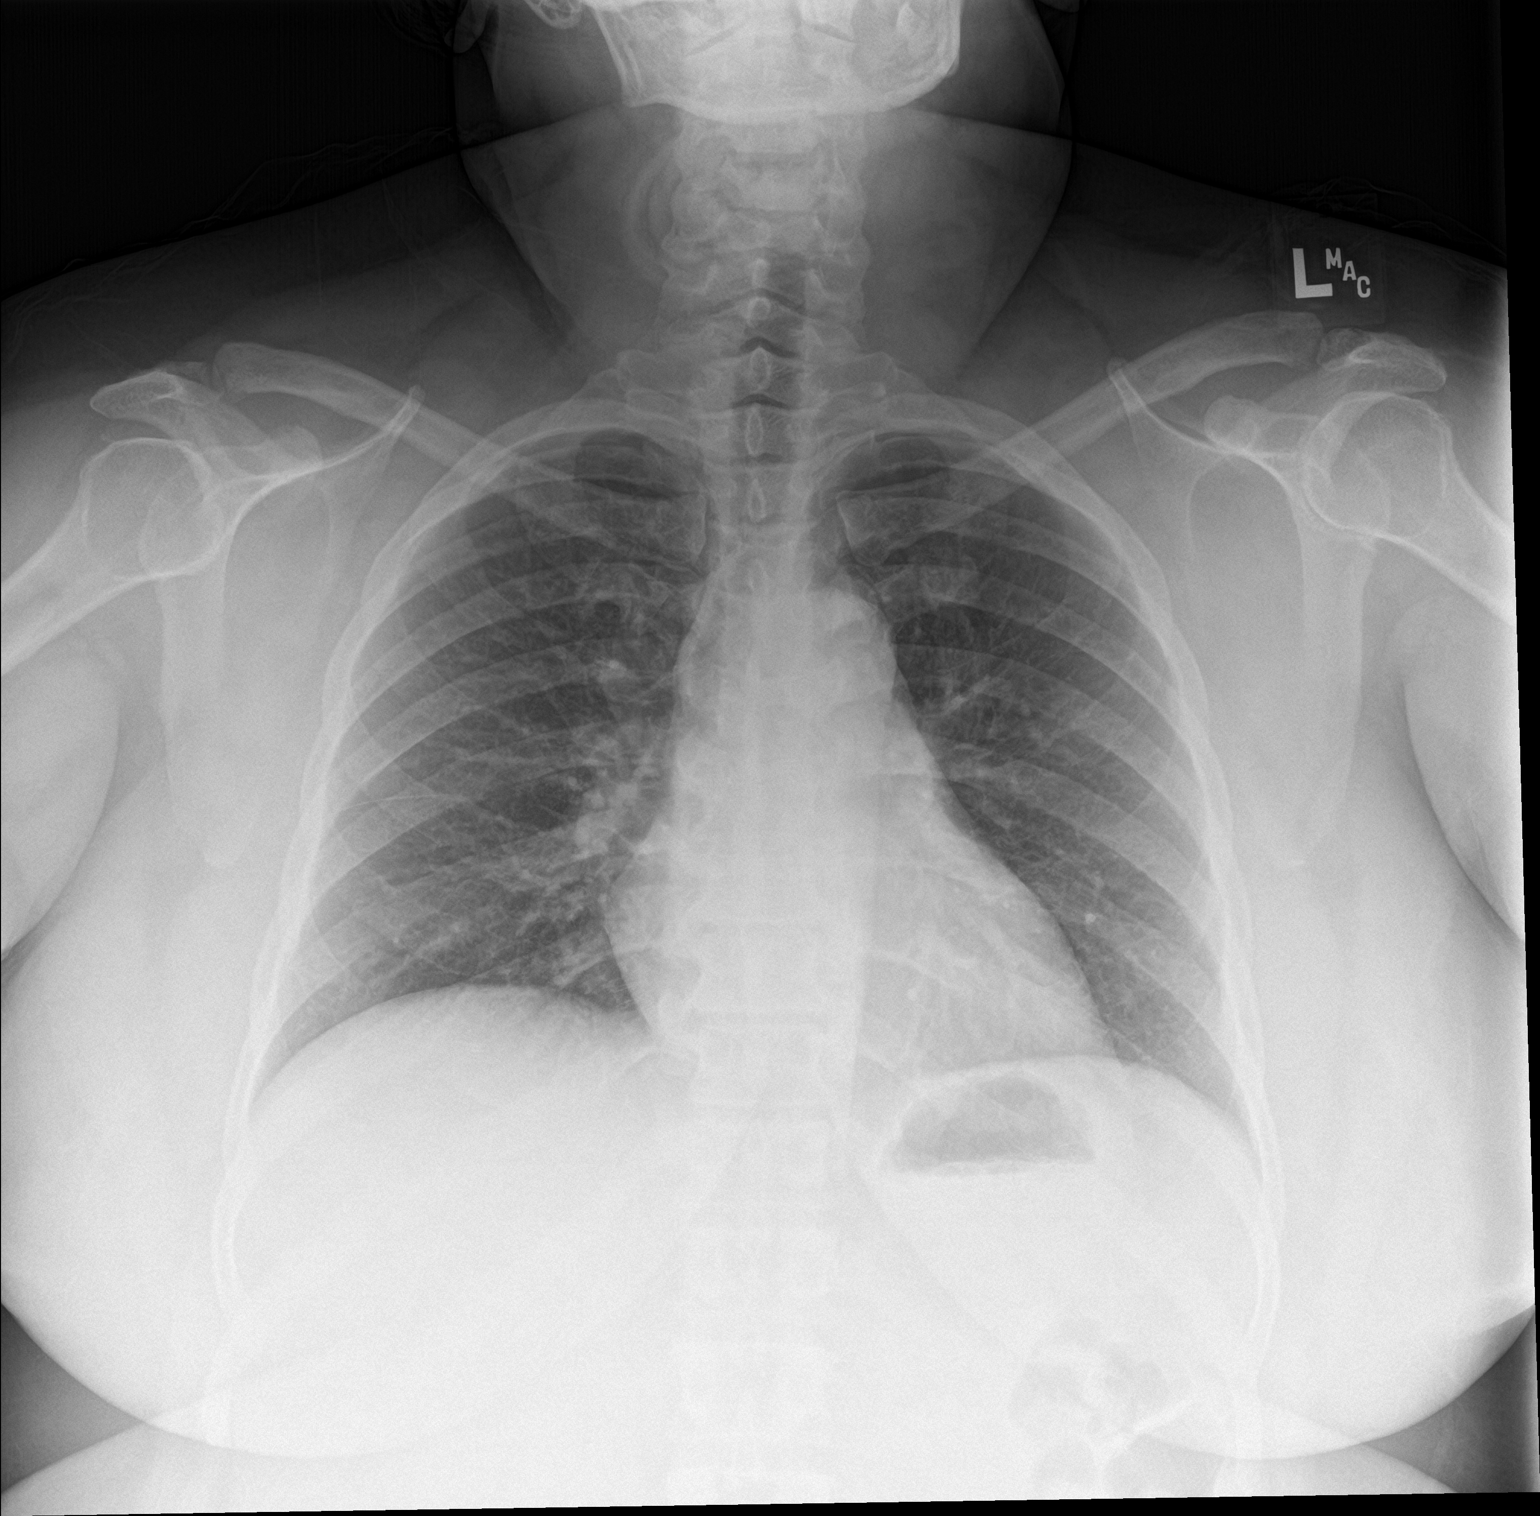

[chest lat]
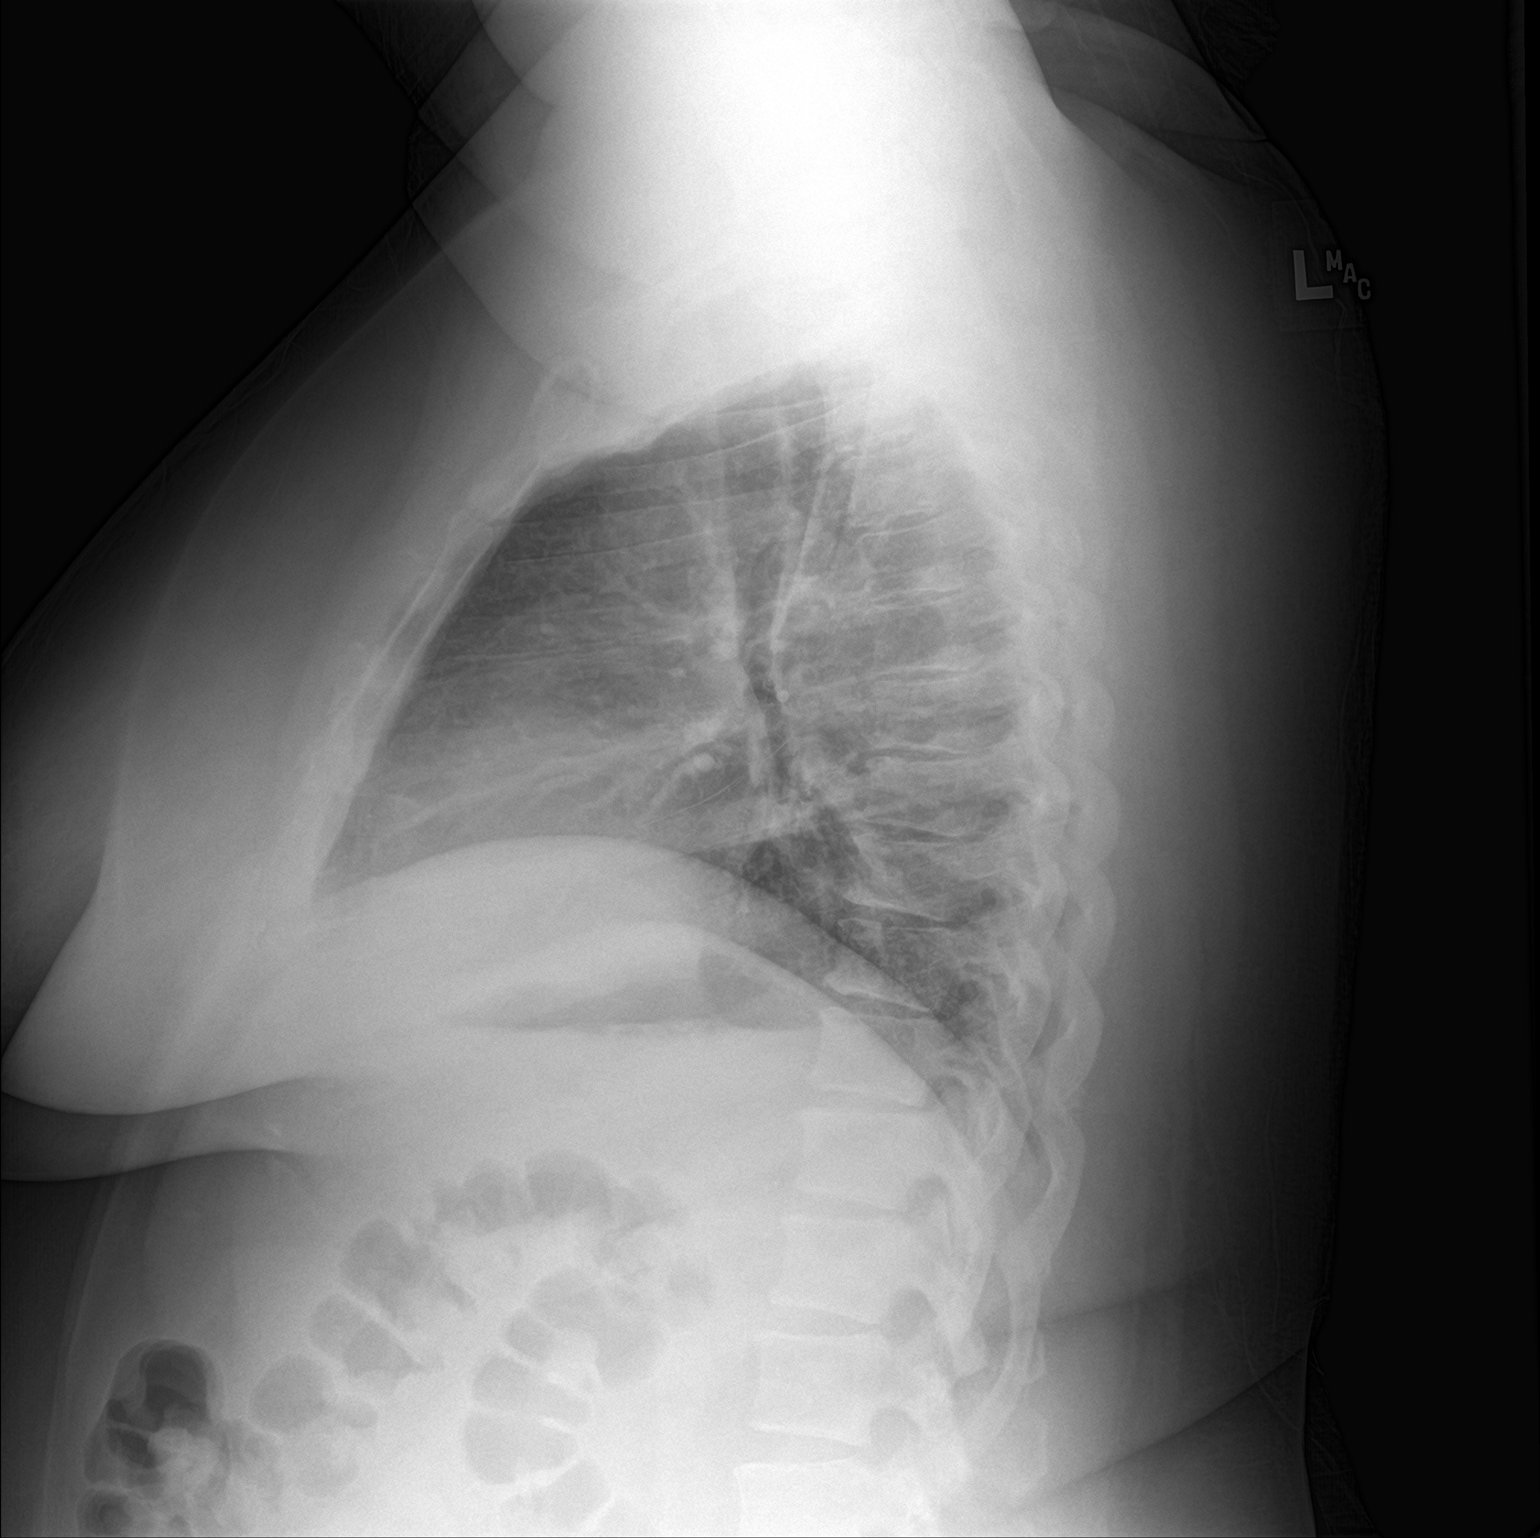

[2 of 2 positions shown; findings below may reference images not displayed]

FINDINGS: Normal cardiac and mediastinal contours. No consolidative pulmonary
opacities. No pleural effusion or pneumothorax. Thoracic spine
degenerative changes.
IMPRESSION: No acute cardiopulmonary process.

## 2017-12-21 MED ORDER — ACETAMINOPHEN 325 MG PO TABS
650.0000 mg | ORAL_TABLET | Freq: Once | ORAL | Status: AC
  Administered 2017-12-21: 650 mg via ORAL
  Filled 2017-12-21: qty 2

## 2017-12-21 MED ORDER — ONDANSETRON 4 MG PO TBDP
4.0000 mg | ORAL_TABLET | Freq: Once | ORAL | Status: AC
  Administered 2017-12-21: 4 mg via ORAL
  Filled 2017-12-21: qty 1

## 2017-12-21 MED ORDER — GUAIFENESIN-CODEINE 100-10 MG/5ML PO SOLN: 5.0000 mL | Freq: Four times a day (QID) | ORAL | 0 refills | Status: DC | PRN

## 2017-12-21 NOTE — Discharge Instructions (Signed)
We discussed be sure to use ibuprofen and Tylenol to help with body aches and fevers.  You can use this cough syrup to help with symptoms.  Please follow-up with your primary care physician.  Please wash your hands and drink plenty fluids.

## 2017-12-21 NOTE — ED Triage Notes (Signed)
C/o flu like sx day 2-NAD-steady gait 

## 2017-12-21 NOTE — ED Notes (Signed)
C/o productive cough x 2 days  Thick yellow  w chest congestion and nausea,  Has not taken any otc meds

## 2017-12-21 NOTE — ED Provider Notes (Signed)
[facility] EMERGENCY DEPARTMENT Provider Note   CSN: [id number] Arrival date & time: 12/21/17  2003     History   Chief Complaint Chief Complaint  Patient presents with  . Cough    HPI Dana Patterson is a 42 y.o. female.  HPI   Is a 42 year old female presenting with cough, congestion, sore throat, nausea.  Patient denies any kind of high fevers.  Patient has not had any sick contacts.  However the fluids going around.  Patient reports mild nausea otherwise eating and drinking.  No abdominal pain.  Patient reports fatigue, mild muscle aches but not bad.  Past Medical History:  Diagnosis Date  . Allergy   . HTN (hypertension) 10/29/2015  . Hyperlipidemia     Patient Active Problem List   Diagnosis Date Noted  . Morbid obesity (HCC) 01/14/2016  . Hypokalemia 01/14/2016  . Obstructive sleep apnea 01/14/2016  . Palpitations 01/14/2016  . Metabolic syndrome 01/14/2016  . Left hip pain 12/09/2015  . HTN (hypertension) 10/29/2015  . Allergic rhinitis 10/29/2015  . Pain in joint, shoulder region 10/29/2015  . Neck pain 10/29/2015    Past Surgical History:  Procedure Laterality Date  . ABDOMINAL HYSTERECTOMY      OB History    No data available       Home Medications    Prior to Admission medications   Medication Sig Start Date End Date Taking? Authorizing Provider  fluticasone (FLONASE) 50 MCG/ACT nasal spray Place 2 sprays into both nostrils daily. 08/23/17   Sharlene Dory, DO  fluticasone [facility]) 50 MCG/ACT nasal spray SPRAY 2 SPRAYS INTO EACH NOSTRIL EVERY DAY 09/26/17   Zola Button, Grayling Congress, DO  hydrochlorothiazide (HYDRODIURIL) 25 MG tablet Take 1 tablet (25 mg total) by mouth daily. 12/01/17   Seabron Spates R, DO  hydrochlorothiazide (HYDRODIURIL) 25 MG tablet TAKE 1 TABLET BY MOUTH EVERY DAY 12/05/17   Wendling, Jilda Roche, DO  ibuprofen (ADVIL,MOTRIN) 600 MG tablet Take 1 tablet (600 mg total) by mouth every 6 (six) hours as  needed. 08/02/17   Horton, Mayer Masker, MD  loratadine (CLARITIN) 10 MG tablet Take 1 tablet (10 mg total) by mouth daily. 04/18/17   Donato Schultz, DO  potassium chloride SA (KLOR-CON M20) 20 MEQ tablet Take 2 tablets (40 mEq total) by mouth daily. 11/29/17   Donato Schultz, DO    Family History Family History  Problem Relation Age of Onset  . Heart disease Mother   . Kidney disease Mother   . Hypertension Mother   . Hypertension Father     Social History Social History   Tobacco Use  . Smoking status: Never Smoker  . Smokeless tobacco: Never Used  Substance Use Topics  . Alcohol use: No    Alcohol/week: 0.0 oz  . Drug use: No     Allergies   Patient has no known allergies.   Review of Systems Review of Systems  Constitutional: Positive for fatigue. Negative for activity change and fever.  HENT: Positive for congestion and sore throat.   Respiratory: Positive for cough. Negative for shortness of breath.   Cardiovascular: Negative for chest pain.  Gastrointestinal: Positive for nausea. Negative for abdominal pain.  All other systems reviewed and are negative.    Physical Exam Updated Vital Signs BP (!) 154/102 (BP Location: Left Arm)   Pulse (!) 103   Temp 97.9 F (36.6 C) (Oral)   Resp (!) 22   SpO2 100%  Physical Exam  Constitutional: She is oriented to person, place, and time. She appears well-developed and well-nourished.  HENT:  Head: Normocephalic and atraumatic.  Mouth/Throat: No oropharyngeal exudate.  BL tm with mild fullness, no infection  Mild erythema to postierior pharynx.  Eyes: Right eye exhibits no discharge. Left eye exhibits no discharge.  Cardiovascular: Normal rate and regular rhythm.  Pulmonary/Chest: Effort normal and breath sounds normal. No respiratory distress. She has no wheezes.  Abdominal: Soft. She exhibits no distension. There is no tenderness.  Neurological: She is oriented to person, place, and time.  Skin: Skin  is warm and dry. She is not diaphoretic.  Psychiatric: She has a normal mood and affect.  Nursing note and vitals reviewed.    ED Treatments / Results  Labs (all labs ordered are listed, but only abnormal results are displayed) Labs Reviewed - No data to display  EKG  EKG Interpretation None       Radiology No results found.  Procedures Procedures (including critical care time)  Medications Ordered in ED Medications  ondansetron (ZOFRAN-ODT) disintegrating tablet 4 mg (4 mg Oral Given 12/21/17 2141)  acetaminophen (TYLENOL) tablet 650 mg (650 mg Oral Given 12/21/17 2145)     Initial Impression / Assessment and Plan / ED Course  I have reviewed the triage vital signs and the nursing notes.  Pertinent labs & imaging results that were available during my care of the patient were reviewed by me and considered in my medical decision making (see chart for details).     Is a 42 year old female presenting with cough, congestion, sore throat, nausea.  Patient denies any kind of high fevers.  Patient has not had any sick contacts.  However the fluids going around.  Patient reports mild nausea otherwise eating and drinking.  No abdominal pain.  Patient reports fatigue, mild muscle aches but not bad.  9:54 PM Very well appearing.  Likely viral syndrome vs the flu vs pna. Will get xray, treat symtpoms.   Final Clinical Impressions(s) / ED Diagnoses   Final diagnoses:  None    ED Discharge Orders    None       Mackuen, Cindee Saltourteney Lyn, MD 12/21/17 2300

## 2018-02-01 ENCOUNTER — Other Ambulatory Visit: Payer: Self-pay

## 2018-02-01 ENCOUNTER — Emergency Department (HOSPITAL_BASED_OUTPATIENT_CLINIC_OR_DEPARTMENT_OTHER)
Admission: EM | Admit: 2018-02-01 | Discharge: 2018-02-01 | Disposition: A | Discharge status: Home / Self Care | Payer: BLUE CROSS/BLUE SHIELD | Attending: Emergency Medicine | Admitting: Emergency Medicine

## 2018-02-01 ENCOUNTER — Encounter (HOSPITAL_BASED_OUTPATIENT_CLINIC_OR_DEPARTMENT_OTHER): Payer: Self-pay

## 2018-02-01 DIAGNOSIS — Y939 Activity, unspecified: Secondary | ICD-10-CM | POA: Insufficient documentation

## 2018-02-01 DIAGNOSIS — X58XXXA Exposure to other specified factors, initial encounter: Secondary | ICD-10-CM | POA: Insufficient documentation

## 2018-02-01 DIAGNOSIS — Z79899 Other long term (current) drug therapy: Secondary | ICD-10-CM | POA: Insufficient documentation

## 2018-02-01 DIAGNOSIS — S39012A Strain of muscle, fascia and tendon of lower back, initial encounter: Secondary | ICD-10-CM | POA: Insufficient documentation

## 2018-02-01 DIAGNOSIS — M545 Low back pain: Secondary | ICD-10-CM | POA: Diagnosis present

## 2018-02-01 DIAGNOSIS — Y929 Unspecified place or not applicable: Secondary | ICD-10-CM | POA: Insufficient documentation

## 2018-02-01 DIAGNOSIS — I10 Essential (primary) hypertension: Secondary | ICD-10-CM | POA: Diagnosis not present

## 2018-02-01 DIAGNOSIS — N390 Urinary tract infection, site not specified: Secondary | ICD-10-CM | POA: Insufficient documentation

## 2018-02-01 DIAGNOSIS — Y999 Unspecified external cause status: Secondary | ICD-10-CM | POA: Insufficient documentation

## 2018-02-01 LAB — URINALYSIS, MICROSCOPIC (REFLEX)

## 2018-02-01 LAB — URINALYSIS, ROUTINE W REFLEX MICROSCOPIC
Bilirubin Urine: NEGATIVE
GLUCOSE, UA: NEGATIVE mg/dL
Hgb urine dipstick: NEGATIVE
Ketones, ur: NEGATIVE mg/dL
LEUKOCYTES UA: NEGATIVE
NITRITE: POSITIVE — AB
PROTEIN: NEGATIVE mg/dL
Specific Gravity, Urine: >1.03 — ABNORMAL HIGH (ref 1.005–1.030)
pH: 5.5 (ref 5.0–8.0)

## 2018-02-01 MED ORDER — CEPHALEXIN 500 MG PO CAPS: 500.0000 mg | ORAL_CAPSULE | Freq: Four times a day (QID) | ORAL | 0 refills | Status: DC

## 2018-02-01 MED ORDER — NAPROXEN 500 MG PO TABS: 500.0000 mg | ORAL_TABLET | Freq: Two times a day (BID) | ORAL | 0 refills | Status: DC

## 2018-02-01 MED ORDER — LIDOCAINE 5 % EX PTCH: 1.0000 | MEDICATED_PATCH | CUTANEOUS | 0 refills | Status: DC

## 2018-02-01 MED ORDER — CYCLOBENZAPRINE HCL 10 MG PO TABS: 10.0000 mg | ORAL_TABLET | Freq: Two times a day (BID) | ORAL | 0 refills | Status: DC | PRN

## 2018-02-01 MED ORDER — OXYCODONE-ACETAMINOPHEN 5-325 MG PO TABS
1.0000 | ORAL_TABLET | Freq: Once | ORAL | Status: AC
  Administered 2018-02-01: 1 via ORAL
  Filled 2018-02-01: qty 1

## 2018-02-01 NOTE — ED Triage Notes (Signed)
C/o lower back pain-woke with sx this am-denies injury-presents to triage in w/c-NAD-pt states she did drive self to ED

## 2018-02-01 NOTE — ED Provider Notes (Signed)
[facility] EMERGENCY DEPARTMENT Provider Note   CSN: [id number] Arrival date & time: 02/01/18  1627     History   Chief Complaint Chief Complaint  Patient presents with  . Back Pain    HPI Dana Patterson is a 42 y.o. female past medical history of hypertension, hyperlipidemia, who presents to ED for evaluation of aching, lower back pain that began when she woke up this morning.  Pain is worse with movement and ambulation.  Had history of similar symptoms in the past.  She has tried 1 dose of ibuprofen with no improvement in her symptoms.  She reports some urinary discomfort but denies any hematuria, history of kidney stones, history of cancer, history of IV drug use, fevers, prior back surgeries, numbness in legs, changes in gait or inability to ambulate, injuries or falls.  HPI  Past Medical History:  Diagnosis Date  . Allergy   . HTN (hypertension) 10/29/2015  . Hyperlipidemia     Patient Active Problem List   Diagnosis Date Noted  . Morbid obesity (HCC) 01/14/2016  . Hypokalemia 01/14/2016  . Obstructive sleep apnea 01/14/2016  . Palpitations 01/14/2016  . Metabolic syndrome 01/14/2016  . Left hip pain 12/09/2015  . HTN (hypertension) 10/29/2015  . Allergic rhinitis 10/29/2015  . Pain in joint, shoulder region 10/29/2015  . Neck pain 10/29/2015    Past Surgical History:  Procedure Laterality Date  . ABDOMINAL HYSTERECTOMY      OB History    No data available       Home Medications    Prior to Admission medications   Medication Sig Start Date End Date Taking? Authorizing Provider  fluticasone (FLONASE) 50 MCG/ACT nasal spray Place 2 sprays into both nostrils daily. 08/23/17   Sharlene Dory, DO  fluticasone [facility]) 50 MCG/ACT nasal spray SPRAY 2 SPRAYS INTO EACH NOSTRIL EVERY DAY 09/26/17   Zola Button, Grayling Congress, DO  hydrochlorothiazide (HYDRODIURIL) 25 MG tablet Take 1 tablet (25 mg total) by mouth daily. 12/01/17   Seabron Spates  R, DO  hydrochlorothiazide (HYDRODIURIL) 25 MG tablet TAKE 1 TABLET BY MOUTH EVERY DAY 12/05/17   Wendling, Jilda Roche, DO  loratadine (CLARITIN) 10 MG tablet Take 1 tablet (10 mg total) by mouth daily. 04/18/17   Donato Schultz, DO  potassium chloride SA (KLOR-CON M20) 20 MEQ tablet Take 2 tablets (40 mEq total) by mouth daily. 11/29/17   Donato Schultz, DO    Family History Family History  Problem Relation Age of Onset  . Heart disease Mother   . Kidney disease Mother   . Hypertension Mother   . Hypertension Father     Social History Social History   Tobacco Use  . Smoking status: Never Smoker  . Smokeless tobacco: Never Used  Substance Use Topics  . Alcohol use: No    Alcohol/week: 0.0 oz  . Drug use: No     Allergies   Patient has no known allergies.   Review of Systems Review of Systems  Constitutional: Negative for chills and fever.  Gastrointestinal: Negative for nausea and vomiting.  Genitourinary: Positive for dysuria. Negative for flank pain, frequency, menstrual problem and pelvic pain.  Musculoskeletal: Positive for back pain.  Skin: Negative for wound.  Neurological: Negative for weakness and numbness.     Physical Exam Updated Vital Signs BP 131/75 (BP Location: Left Arm)   Pulse 74   Temp 98.3 F (36.8 C) (Oral)   Resp 18   Ht  5\' 2"  (1.575 m)   Wt 103.4 kg (228 lb)   SpO2 100%   BMI 41.70 kg/m   Physical Exam  Constitutional: She appears well-developed and well-nourished. No distress.  Nontoxic appearing and in no acute distress.  HENT:  Head: Normocephalic and atraumatic.  Eyes: Conjunctivae and EOM are normal. No scleral icterus.  Neck: Normal range of motion.  Pulmonary/Chest: Effort normal. No respiratory distress.  Musculoskeletal: Normal range of motion. She exhibits tenderness. She exhibits no edema or deformity.       Back:  No midline spinal tenderness present in lumbar, thoracic or cervical spine. No step-off  palpated. No visible bruising, edema or temperature change noted. No objective signs of numbness present. No saddle anesthesia. 2+ DP pulses bilaterally. Sensation intact to light touch. Strength 5/5 in bilateral lower extremities. Negative CVA tenderness bilaterally.  Neurological: She is alert.  Skin: No rash noted. She is not diaphoretic.  Psychiatric: She has a normal mood and affect.  Nursing note and vitals reviewed.    ED Treatments / Results  Labs (all labs ordered are listed, but only abnormal results are displayed) Labs Reviewed  URINALYSIS, ROUTINE W REFLEX MICROSCOPIC - Abnormal; Notable for the following components:      Result Value   APPearance HAZY (*)    Specific Gravity, Urine >1.030 (*)    Nitrite POSITIVE (*)    All other components within normal limits  URINALYSIS, MICROSCOPIC (REFLEX) - Abnormal; Notable for the following components:   Bacteria, UA MANY (*)    Squamous Epithelial / LPF 6-30 (*)    All other components within normal limits    EKG  EKG Interpretation None       Radiology No results found.  Procedures Procedures (including critical care time)  Medications Ordered in ED Medications  oxyCODONE-acetaminophen (PERCOCET/ROXICET) 5-325 MG per tablet 1 tablet (not administered)     Initial Impression / Assessment and Plan / ED Course  I have reviewed the triage vital signs and the nursing notes.  Pertinent labs & imaging results that were available during my care of the patient were reviewed by me and considered in my medical decision making (see chart for details).     Patient presents to ED for evaluation of lower back pain that began after waking up this morning.  She denies any red flags for back pain including prior back surgeries, history of cancer, history of IV drug use, numbness in legs, loss of bowel or bladder function, fevers.  She has been ambulatory with normal gait.  She does report some urinary discomfort.  Physical exam  she does have musculature tenderness to palpation with no midline tenderness to palpation deficits on neurological examination.  Urinalysis with evidence of possible UTI.  I suspect that this could be contributing to some of her symptoms as well as a possible muscle strain.  Will treat with antibiotics, anti-inflammatories, muscle relaxer to help with symptoms.  I doubt cauda equina, pyelonephritis as she has no CVA tenderness or fever, or other spinal cord injury as a cause of her symptoms.  Patient appears stable for discharge at this time.  Strict return precautions given.  Portions of this note were generated with Scientist, clinical (histocompatibility and immunogenetics)Dragon dictation software. Dictation errors may occur despite best attempts at proofreading.   Final Clinical Impressions(s) / ED Diagnoses   Final diagnoses:  None    ED Discharge Orders    None       Dietrich PatesKhatri, Rontrell Moquin, PA-C 02/01/18 2012  Tegeler, Canary Brim, MD 02/01/18 [phone]

## 2018-02-02 ENCOUNTER — Other Ambulatory Visit: Payer: Self-pay | Admitting: Family Medicine

## 2018-02-02 DIAGNOSIS — I10 Essential (primary) hypertension: Secondary | ICD-10-CM

## 2018-02-02 MED ORDER — HYDROCHLOROTHIAZIDE 25 MG PO TABS: 25.0000 mg | ORAL_TABLET | Freq: Every day | ORAL | 0 refills | Status: DC

## 2018-02-22 ENCOUNTER — Encounter: Payer: Self-pay | Admitting: Family Medicine

## 2018-02-22 ENCOUNTER — Ambulatory Visit: Payer: BLUE CROSS/BLUE SHIELD | Admitting: Family Medicine

## 2018-02-22 VITALS — BP 132/90 | HR 83 | Resp 16 | Ht 62.0 in | Wt 242.2 lb

## 2018-02-22 DIAGNOSIS — M79662 Pain in left lower leg: Secondary | ICD-10-CM

## 2018-02-22 DIAGNOSIS — N83299 Other ovarian cyst, unspecified side: Secondary | ICD-10-CM

## 2018-02-22 LAB — POC URINALSYSI DIPSTICK (AUTOMATED)
Bilirubin, UA: NEGATIVE
Blood, UA: NEGATIVE
Glucose, UA: NEGATIVE
KETONES UA: NEGATIVE
LEUKOCYTES UA: NEGATIVE
Nitrite, UA: NEGATIVE
PH UA: 6 (ref 5.0–8.0)
PROTEIN UA: NEGATIVE
SPEC GRAV UA: 1.01 (ref 1.010–1.025)
UROBILINOGEN UA: 0.2 U/dL

## 2018-02-22 NOTE — Progress Notes (Signed)
Patient ID: Dana Patterson, female    DOB: Aug 01, 1976  Age: 42 y.o. MRN: [id number]    Subjective:  Subjective  HPI Dana Patterson presents for R low abd pain .  She was seen at [facility] and ct and us done--- dx with complex ovarian cyst on R side.  They set her up with [facility] but app not until 4/29---pt is concerned  She is also having pain in L leg --- feels like water going down her leg , some back pain.  No injury. Numbness in L leg   Review of Systems  Constitutional: Negative for activity change, appetite change, fatigue and unexpected weight change.  Respiratory: Negative for cough and shortness of breath.   Cardiovascular: Negative for chest pain and palpitations.  Gastrointestinal: Positive for abdominal pain.  Musculoskeletal: Positive for back pain.  Neurological: Positive for numbness. Negative for weakness.  Psychiatric/Behavioral: Negative for behavioral problems and dysphoric mood. The patient is not nervous/anxious.     History Past Medical History:  Diagnosis Date  . Allergy   . HTN (hypertension) 10/29/2015  . Hyperlipidemia     She has a past surgical history that includes Abdominal hysterectomy.   Her family history includes Heart disease in her mother; Hypertension in her father and mother; Kidney disease in her mother.She reports that she has never smoked. She has never used smokeless tobacco. She reports that she does not drink alcohol or use drugs.  Current Outpatient Medications on File Prior to Visit  Medication Sig Dispense Refill  . fluticasone (FLONASE) 50 MCG/ACT nasal spray Place 2 sprays into both nostrils daily. 16 g 0  . hydrochlorothiazide (HYDRODIURIL) 25 MG tablet TAKE 1 TABLET BY MOUTH EVERY DAY 30 tablet 0  . hydrochlorothiazide (HYDRODIURIL) 25 MG tablet Take 1 tablet (25 mg total) by mouth daily. 90 tablet 0  . potassium chloride SA (KLOR-CON M20) 20 MEQ tablet Take 2 tablets (40 mEq total) by mouth daily. 180 tablet 0   No  current facility-administered medications on file prior to visit.      Objective:  Objective  Physical Exam  Constitutional: She is oriented to person, place, and time. She appears well-developed and well-nourished.  HENT:  Head: Normocephalic and atraumatic.  Eyes: Conjunctivae and EOM are normal.  Neck: Normal range of motion. Neck supple. No JVD present. Carotid bruit is not present. No thyromegaly present.  Cardiovascular: Normal rate, regular rhythm and normal heart sounds.  No murmur heard. Pulmonary/Chest: Effort normal and breath sounds normal. No respiratory distress. She has no wheezes. She has no rales. She exhibits no tenderness.  Abdominal: Soft. Bowel sounds are normal. There is tenderness. There is no rebound and no guarding.  Musculoskeletal: She exhibits no edema, tenderness or deformity.  Neurological: She is alert and oriented to person, place, and time. She displays normal reflexes. She exhibits normal muscle tone.  Psychiatric: She has a normal mood and affect. Her behavior is normal. Judgment and thought content normal.  Nursing note and vitals reviewed.  BP 132/90 (BP Location: Right Arm, Patient Position: Sitting, Cuff Size: Large)   Pulse 83   Resp 16   Ht 5\' 2"  (1.575 m)   Wt 242 lb 3.2 oz (109.9 kg)   SpO2 98%   BMI 44.30 kg/m  Wt Readings from Last 3 Encounters:  02/22/18 242 lb 3.2 oz (109.9 kg)  02/01/18 228 lb (103.4 kg)  08/23/17 234 lb 6 oz (106.3 kg)     Lab Results  Component Value Date   WBC 5.0 08/23/2017   HGB 13.2 08/23/2017   HCT 39.1 08/23/2017   PLT 261.0 08/23/2017   GLUCOSE 94 08/23/2017   CHOL 208 (H) 05/03/2016   TRIG 121.0 05/03/2016   HDL 53.30 05/03/2016   LDLCALC 130 (H) 05/03/2016   ALT 29 08/23/2017   AST 17 08/23/2017   NA 137 08/23/2017   K 3.9 08/23/2017   CL 101 08/23/2017   CREATININE 0.66 08/23/2017   BUN 10 08/23/2017   CO2 28 08/23/2017   TSH 1.200 11/17/2015   HGBA1C 5.8 05/03/2016   MICROALBUR <0.7  05/03/2016    No results found.   Assessment & Plan:  Plan  I have discontinued D[place]en Mckown's loratadine, cephALEXin, naproxen, cyclobenzaprine, and lidocaine. I am also having her maintain her fluticasone, potassium chloride SA, hydrochlorothiazide, and hydrochlorothiazide.  No orders of the defined types were placed in this encounter.   Problem List Items Addressed This Visit      Unprioritized   Morbid obesity (HCC)    Pt given info for healthy weight and wellness       Other Visit Diagnoses    Complex ovarian cyst    -  Primary   Relevant Orders   Ambulatory referral to Obstetrics / Gynecology   POCT Urinalysis Dipstick (Automated) (Completed)   CA 125   Pain of left calf       Relevant Orders   US Venous Img Lower Unilateral Left      Follow-up: Return in about 3 months (around 05/24/2018), or if symptoms worsen or fail to improve, for annual exam, fasting.  Donato Schultz, DO

## 2018-02-22 NOTE — Patient Instructions (Signed)
Exercising to Lose Weight Exercising can help you to lose weight. In order to lose weight through exercise, you need to do vigorous-intensity exercise. You can tell that you are exercising with vigorous intensity if you are breathing very hard and fast and cannot hold a conversation while exercising. Moderate-intensity exercise helps to maintain your current weight. You can tell that you are exercising at a moderate level if you have a higher heart rate and faster breathing, but you are still able to hold a conversation. How often should I exercise? Choose an activity that you enjoy and set realistic goals. Your health care provider can help you to make an activity plan that works for you. Exercise regularly as directed by your health care provider. This may include:  Doing resistance training twice each week, such as: ? Push-ups. ? Sit-ups. ? Lifting weights. ? Using resistance bands.  Doing a given intensity of exercise for a given amount of time. Choose from these options: ? 150 minutes of moderate-intensity exercise every week. ? 75 minutes of vigorous-intensity exercise every week. ? A mix of moderate-intensity and vigorous-intensity exercise every week.  Children, pregnant women, people who are out of shape, people who are overweight, and older adults may need to consult a health care provider for individual recommendations. If you have any sort of medical condition, be sure to consult your health care provider before starting a new exercise program. What are some activities that can help me to lose weight?  Walking at a rate of at least 4.5 miles an hour.  Jogging or running at a rate of 5 miles per hour.  Biking at a rate of at least 10 miles per hour.  Lap swimming.  Roller-skating or in-line skating.  Cross-country skiing.  Vigorous competitive sports, such as football, basketball, and soccer.  Jumping rope.  Aerobic dancing. How can I be more active in my day-to-day  activities?  Use the stairs instead of the elevator.  Take a walk during your lunch break.  If you drive, park your car farther away from work or school.  If you take public transportation, get off one stop early and walk the rest of the way.  Make all of your phone calls while standing up and walking around.  Get up, stretch, and walk around every 30 minutes throughout the day. What guidelines should I follow while exercising?  Do not exercise so much that you hurt yourself, feel dizzy, or get very short of breath.  Consult your health care provider prior to starting a new exercise program.  Wear comfortable clothes and shoes with good support.  Drink plenty of water while you exercise to prevent dehydration or heat stroke. Body water is lost during exercise and must be replaced.  Work out until you breathe faster and your heart beats faster. This information is not intended to replace advice given to you by your health care provider. Make sure you discuss any questions you have with your health care provider. Document Released: 12/10/2010 Document Revised: 04/14/2016 Document Reviewed: 04/10/2014 Elsevier Interactive Patient Education  2018 [employer]vier Inc[employer]  

## 2018-02-22 NOTE — Assessment & Plan Note (Signed)
Pt given info for healthy weight and wellness 

## 2018-02-23 ENCOUNTER — Ambulatory Visit (HOSPITAL_BASED_OUTPATIENT_CLINIC_OR_DEPARTMENT_OTHER)
Admission: RE | Admit: 2018-02-23 | Discharge: 2018-02-23 | Disposition: A | Discharge status: Home / Self Care | Payer: BLUE CROSS/BLUE SHIELD | Source: Ambulatory Visit | Attending: Family Medicine | Admitting: Family Medicine

## 2018-02-23 DIAGNOSIS — M79662 Pain in left lower leg: Secondary | ICD-10-CM | POA: Diagnosis not present

## 2018-02-23 LAB — CA 125: CA 125: 10 U/mL (ref <35)

## 2018-02-26 ENCOUNTER — Encounter: Payer: Self-pay | Admitting: Family Medicine

## 2018-02-26 ENCOUNTER — Ambulatory Visit (INDEPENDENT_AMBULATORY_CARE_PROVIDER_SITE_OTHER): Payer: BLUE CROSS/BLUE SHIELD | Admitting: Family Medicine

## 2018-02-26 ENCOUNTER — Encounter (HOSPITAL_BASED_OUTPATIENT_CLINIC_OR_DEPARTMENT_OTHER): Payer: Self-pay

## 2018-02-26 ENCOUNTER — Ambulatory Visit (HOSPITAL_BASED_OUTPATIENT_CLINIC_OR_DEPARTMENT_OTHER)
Admission: RE | Admit: 2018-02-26 | Discharge: 2018-02-26 | Disposition: A | Discharge status: Home / Self Care | Payer: BLUE CROSS/BLUE SHIELD | Source: Ambulatory Visit | Attending: Family Medicine | Admitting: Family Medicine

## 2018-02-26 VITALS — BP 144/78 | HR 85 | Ht 62.0 in | Wt 240.0 lb

## 2018-02-26 DIAGNOSIS — N83291 Other ovarian cyst, right side: Secondary | ICD-10-CM | POA: Insufficient documentation

## 2018-02-26 DIAGNOSIS — N83209 Unspecified ovarian cyst, unspecified side: Secondary | ICD-10-CM

## 2018-02-26 MED ORDER — IBUPROFEN 600 MG PO TABS: 600.0000 mg | ORAL_TABLET | Freq: Four times a day (QID) | ORAL | 3 refills | Status: DC | PRN

## 2018-02-26 NOTE — Progress Notes (Signed)
   Subjective:    Patient ID: Dana KitchensStephanie Saye, female    DOB: Aug 08, 1976, 42 y.o.   MRN: [id number]  HPI This pleasant 42 year old female was referred to our office for a complex right ovarian cyst seen a week ago on ultrasound [facility].  She had presented with abdominal pain that had started the night before, located on the lower abdomen.  She saw her primary care physician several days later, who referred her to my office.  Currently, the patient complains of continued right pelvic pain, although somewhat improved. Pain worse with activity and improved with rest. Pain was radiating to the back, but improved now.   [facility] shows a complex predominantly cystic right ovarian mass with adjacent fluid, most likely representing a hemorrhagic cysts ovarian cyst.  Uterus is surgically absent and unable to visualize left ovary.  CT [facility] shows complex right adnexal mass with fluid surrounding this mass question hemorrhagic cystic lesion versus atypical ovarian neoplasm.  She reports surgical history of abdominal hysterectomy performed [facility] about 8 years ago for large fibroids. She is unsure if she has a cervix.  I have reviewed the patients past medical, family, and social history.  I have reviewed the patient's medication list and allergies.  Review of Systems  Constitutional: Negative for chills, fatigue and fever.  Respiratory: Negative for shortness of breath.   Cardiovascular: Negative for chest pain.  Neurological: Negative for dizziness and light-headedness.  All other systems reviewed and are negative.      Objective:   Physical Exam  Constitutional: She is oriented to person, place, and time. She appears well-developed and well-nourished.  HENT:  Head: Normocephalic and atraumatic.  Right Ear: External ear normal.  Left Ear: External ear normal.  Cardiovascular: Normal rate  and regular rhythm. Exam reveals no gallop and no friction rub.  No murmur heard. Pulmonary/Chest: Effort normal. No respiratory distress. She has no wheezes. She has no rales.  Abdominal: Soft. She exhibits no distension. There is tenderness (RLQ). There is no rebound and no guarding.  Neurological: She is alert and oriented to person, place, and time.  Skin: Skin is warm and dry.  Psychiatric: She has a normal mood and affect. Her behavior is normal. Judgment and thought content normal.      Assessment & Plan:  1. Hemorrhagic ovarian cyst While the imaging reports are available to me, unfortunately the images themselves are not.  Per the radiology report, they favor hemorrhagic cyst.  Additionally, the patient's CA 125, which was obtained by the primary care physician at my request, is 10.  This favors a benign process as well.  I discussed these results with the patient, as well as process of hemorrhagic cysts.  I encouraged NSAID use for pain control, particularly with ovarian cyst.  We will repeat ultrasound in 5-6 weeks to evaluate for improvement or resolution.  I encouraged the patient to return or call with increasing pain, fever/chills, or presyncope symptoms. - US PELVIS TRANSVANGINAL NON-OB (TV ONLY); Future  Approximately 40 minutes were spent in reviewing the patient's records, laboratory data, and in discussing this data and coordination of care with the patient.  A letter was sent back to the patient's PCP discussing my recommendations and plan of care.

## 2018-02-26 NOTE — Addendum Note (Signed)
Addended by: Levie HeritageSTINSON, JACOB J on: 02/26/2018 11:01 AM   Modules accepted: Orders

## 2018-02-26 NOTE — Progress Notes (Signed)
Patient states that she is referral from Dr. Laury AxonLowne. Dr. Laury AxonLowne referred her for a cyst that was seen at [facility].  Patient denies any bleeding. Patient has some pain like menstrual like cramping. Armandina StammerJennifer Howard RN

## 2018-03-31 ENCOUNTER — Ambulatory Visit (HOSPITAL_BASED_OUTPATIENT_CLINIC_OR_DEPARTMENT_OTHER)
Admission: RE | Admit: 2018-03-31 | Discharge: 2018-03-31 | Disposition: A | Discharge status: Home / Self Care | Payer: BLUE CROSS/BLUE SHIELD | Source: Ambulatory Visit | Attending: Family Medicine | Admitting: Family Medicine

## 2018-03-31 DIAGNOSIS — Z90710 Acquired absence of both cervix and uterus: Secondary | ICD-10-CM | POA: Insufficient documentation

## 2018-03-31 DIAGNOSIS — N83209 Unspecified ovarian cyst, unspecified side: Secondary | ICD-10-CM

## 2018-04-06 ENCOUNTER — Encounter: Payer: Self-pay | Admitting: Family Medicine

## 2018-04-06 ENCOUNTER — Ambulatory Visit (INDEPENDENT_AMBULATORY_CARE_PROVIDER_SITE_OTHER): Payer: BLUE CROSS/BLUE SHIELD | Admitting: Family Medicine

## 2018-04-06 VITALS — BP 114/65 | HR 79 | Ht 62.0 in | Wt 239.0 lb

## 2018-04-06 DIAGNOSIS — N83209 Unspecified ovarian cyst, unspecified side: Secondary | ICD-10-CM

## 2018-04-06 NOTE — Progress Notes (Signed)
   Subjective:    Patient ID: Dana Patterson, female    DOB: 01-21-1976, 42 y.o.   MRN: [id number]  HPI Patient seen for follow up of ovarian cyst. Had [place] last week, showing decreasing size. Patient asymptomatic.   Review of Systems     Objective:   Physical Exam  Constitutional: She is oriented to person, place, and time. She appears well-developed and well-nourished.  Cardiovascular: Normal rate, regular rhythm and normal heart sounds.  Pulmonary/Chest: Effort normal and breath sounds normal. No respiratory distress.  Abdominal: Soft. She exhibits no distension and no mass. There is no tenderness. There is no guarding.  Neurological: She is alert and oriented to person, place, and time.  Skin: Capillary refill takes 2 to 3 seconds.      Assessment & Plan:  1. Hemorrhagic ovarian cyst Resolving. F/u prn for annual exam.

## 2018-04-26 ENCOUNTER — Other Ambulatory Visit: Payer: Self-pay

## 2018-04-26 ENCOUNTER — Emergency Department (HOSPITAL_BASED_OUTPATIENT_CLINIC_OR_DEPARTMENT_OTHER): Payer: BLUE CROSS/BLUE SHIELD

## 2018-04-26 ENCOUNTER — Emergency Department (HOSPITAL_BASED_OUTPATIENT_CLINIC_OR_DEPARTMENT_OTHER)
Admission: EM | Admit: 2018-04-26 | Discharge: 2018-04-26 | Disposition: A | Discharge status: Home / Self Care | Payer: BLUE CROSS/BLUE SHIELD | Attending: Emergency Medicine | Admitting: Emergency Medicine

## 2018-04-26 ENCOUNTER — Encounter (HOSPITAL_BASED_OUTPATIENT_CLINIC_OR_DEPARTMENT_OTHER): Payer: Self-pay | Admitting: *Deleted

## 2018-04-26 DIAGNOSIS — Z79899 Other long term (current) drug therapy: Secondary | ICD-10-CM | POA: Insufficient documentation

## 2018-04-26 DIAGNOSIS — M779 Enthesopathy, unspecified: Secondary | ICD-10-CM

## 2018-04-26 DIAGNOSIS — I10 Essential (primary) hypertension: Secondary | ICD-10-CM | POA: Diagnosis not present

## 2018-04-26 DIAGNOSIS — M65841 Other synovitis and tenosynovitis, right hand: Secondary | ICD-10-CM | POA: Diagnosis not present

## 2018-04-26 DIAGNOSIS — M25531 Pain in right wrist: Secondary | ICD-10-CM | POA: Diagnosis present

## 2018-04-26 IMAGING — DX DG WRIST COMPLETE 3+V*R*
4 series · 5 of 5 positions shown · non-contrast
Comparison: None.

CLINICAL DATA: RIGHT wrist pain for 2 weeks.

EXAM:
RIGHT WRIST - COMPLETE 3+ VIEW

[wrist pa]
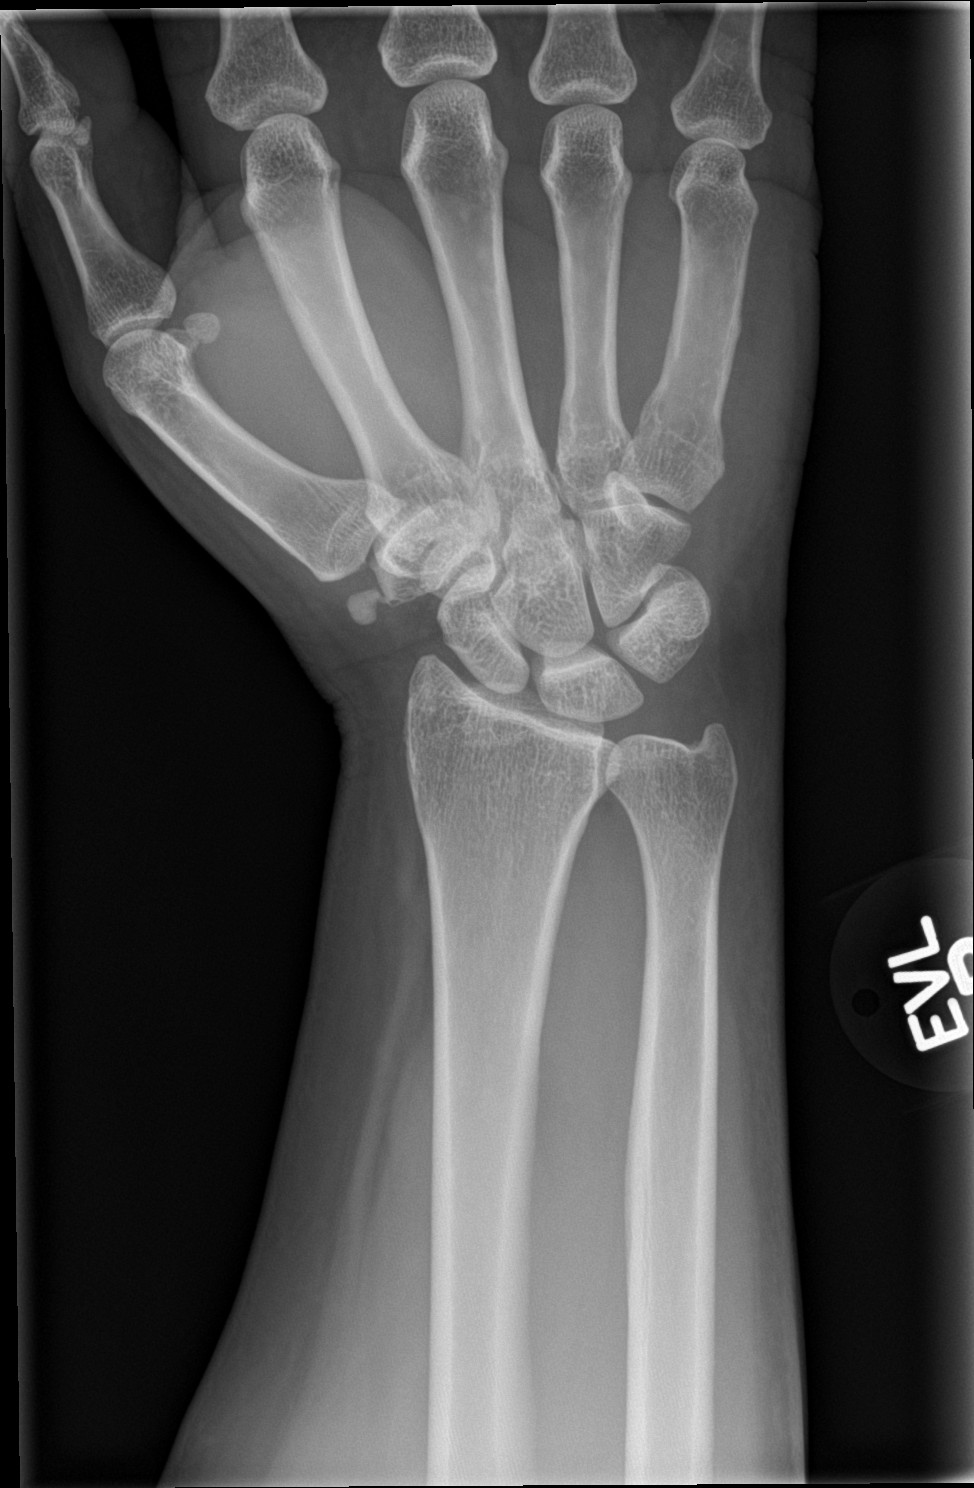

[wrist obl]
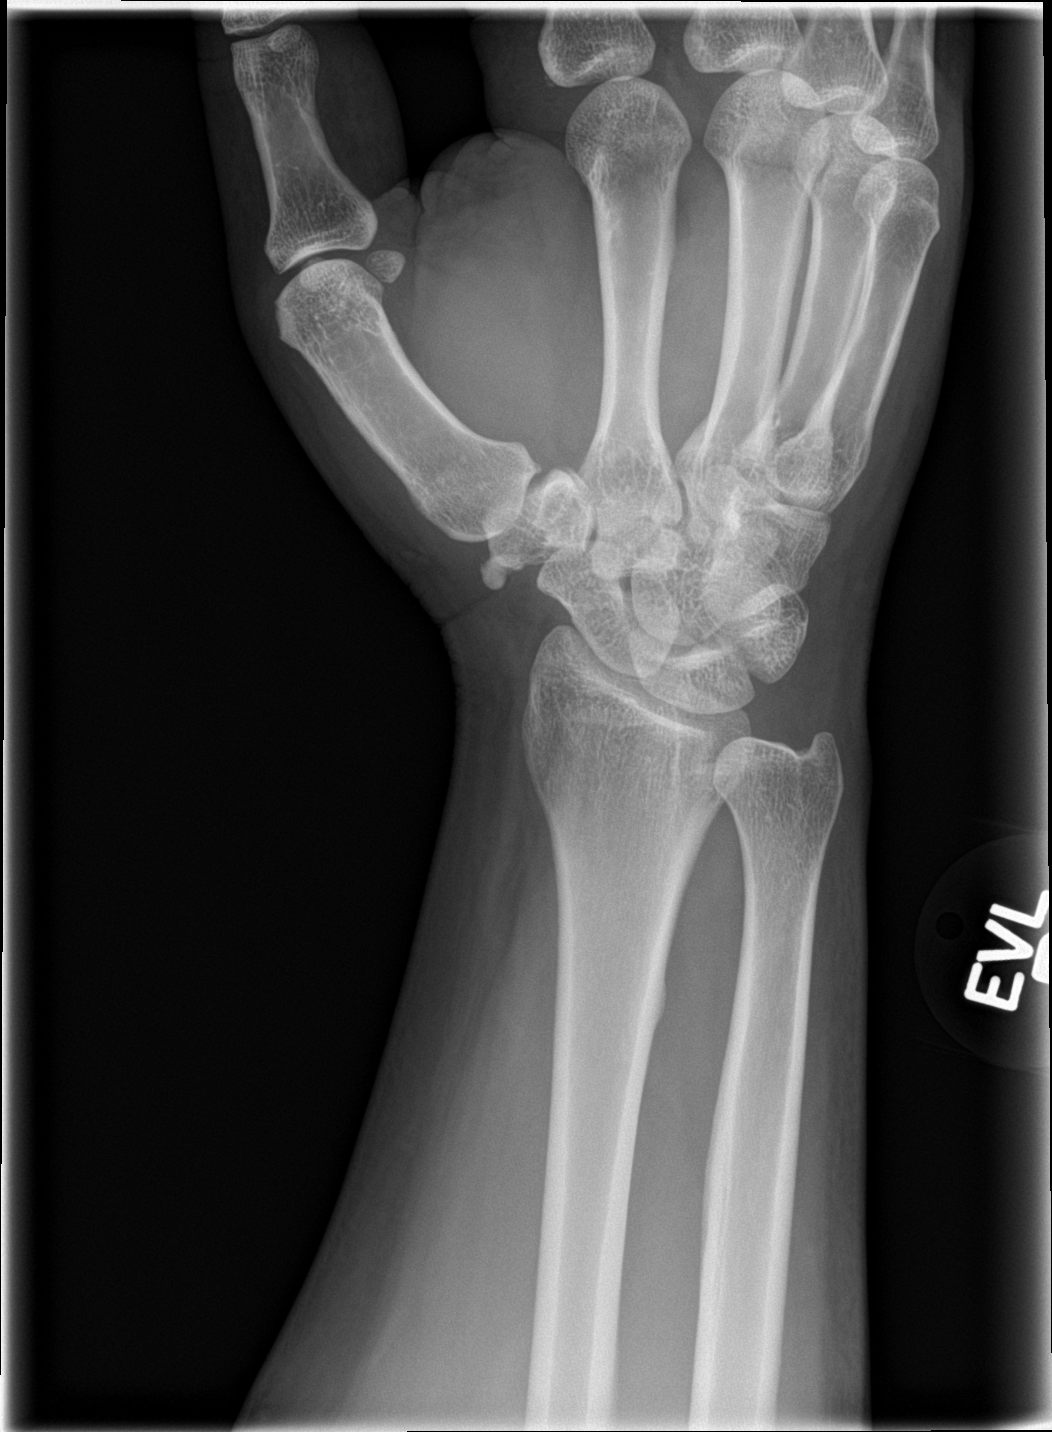

[wrist lat]
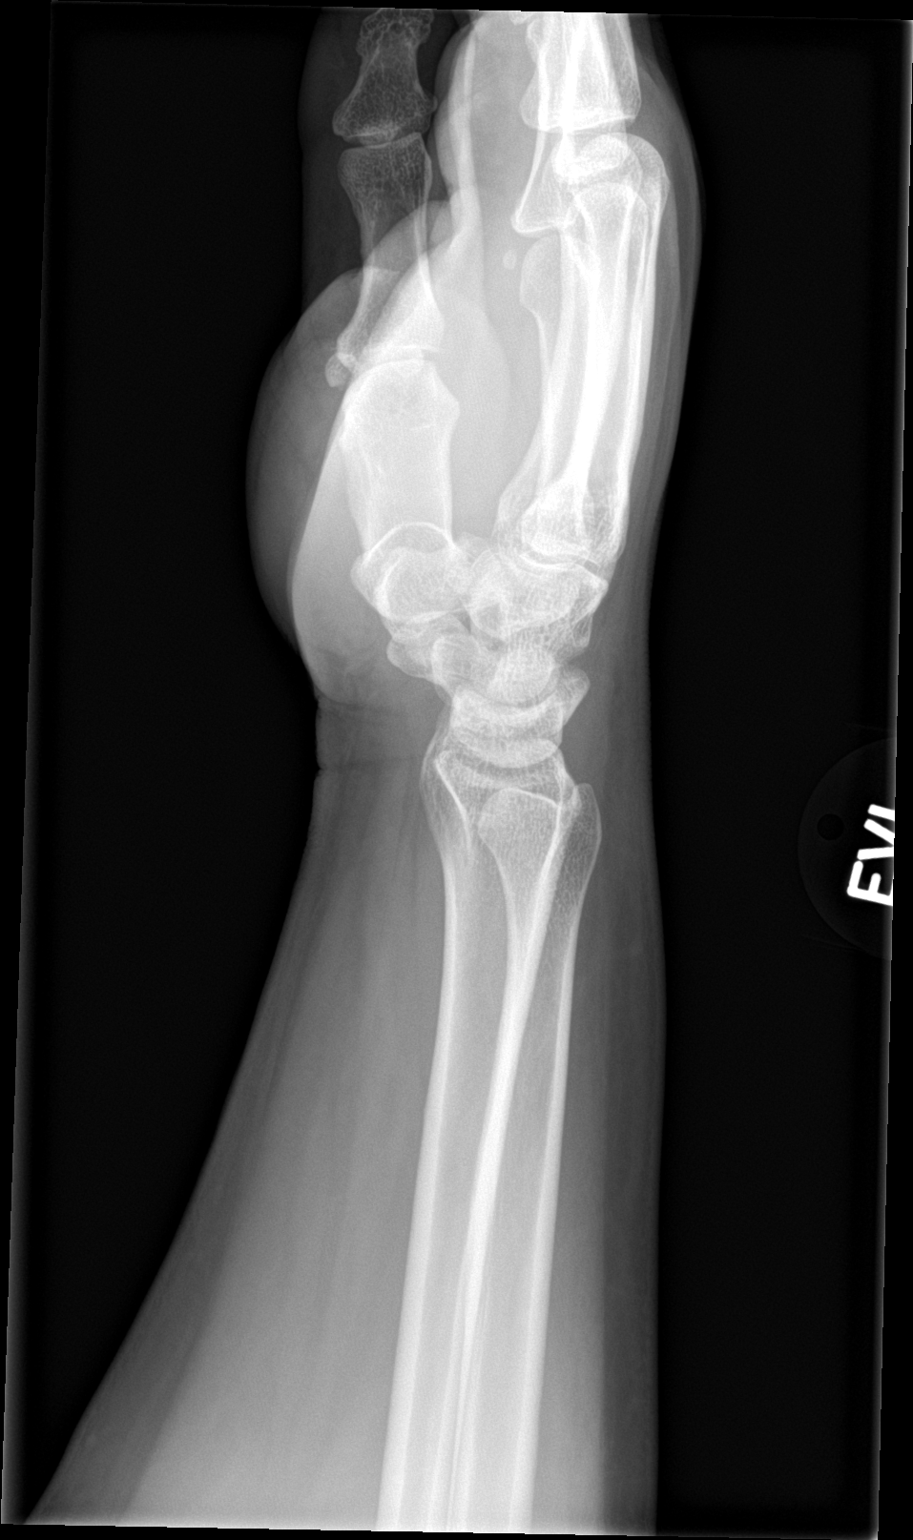

[Series 4: wrist navicular · 0.14mm/px · 2 of 2 slices shown]
[im 1/2]
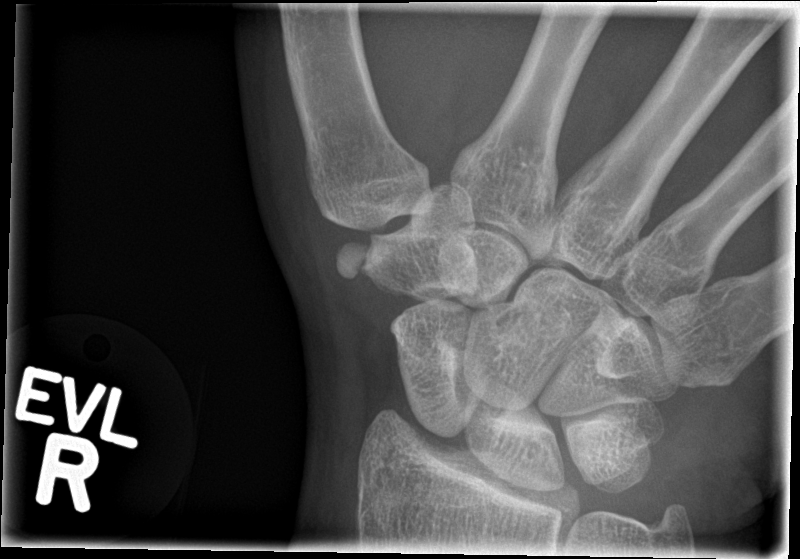
[im 2/2]
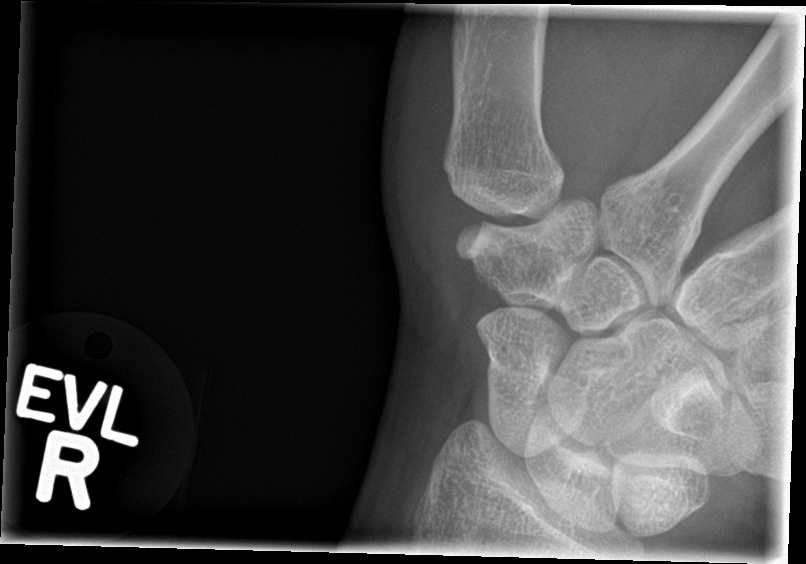

[5 of 5 positions shown; findings below may reference images not displayed]

FINDINGS: No acute fracture, subluxation or dislocation.

A 5 mm amorphous globular calcification along the LATERAL aspect of
the first carpometacarpal joint probably represents hydroxyapatite
deposition disease.

No focal bony lesions are present.
IMPRESSION: 1. No evidence of acute abnormality
2. 5 mm calcification along the LATERAL first carpometacarpal joint,
likely representing hydroxyapatite deposition disease.

## 2018-04-26 MED ORDER — NAPROXEN 250 MG PO TABS
500.0000 mg | ORAL_TABLET | Freq: Once | ORAL | Status: AC
  Administered 2018-04-26: 500 mg via ORAL
  Filled 2018-04-26: qty 2

## 2018-04-26 MED ORDER — NAPROXEN 500 MG PO TABS: 500.0000 mg | ORAL_TABLET | Freq: Two times a day (BID) | ORAL | 0 refills | Status: DC | PRN

## 2018-04-26 NOTE — Discharge Instructions (Signed)
It was my pleasure taking care of you today!   Naproxen twice daily as needed for pain. In addition to this, you can take over-the-counter Tylenol.   Ice or heat to affected area will help with pain/swelling as well.   Wear wrist splint.   If your wrist is not feeling better with the above instructions in 5-7 days, call the sports medicine doctor listed to schedule a follow up appointment.   Return to ER for new or worsening symptoms, any additional concerns.

## 2018-04-26 NOTE — ED Notes (Signed)
NAD at this time. Pt is stable and going home.  

## 2018-04-26 NOTE — ED Triage Notes (Signed)
Pt c/o right hand /wrist pain x 2 weeks

## 2018-04-26 NOTE — ED Provider Notes (Signed)
[facility] EMERGENCY DEPARTMENT Provider Note   CSN: [id number] Arrival date & time: 04/26/18  1805     History   Chief Complaint Chief Complaint  Patient presents with  . Hand Pain    HPI Dana Patterson is a 42 y.o. female.  The history is provided by the patient and medical records. No language interpreter was used.  Hand Pain    Dana Patterson is a right hand dominant 42 y.o. female with a PMH as listed below who presents to the Emergency Department complaining of aching, progressively worsening right wrist pain for the last week or so.  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  She feels as if this is exacerbating her pain.  During these activities makes her pain worse as well as putting pressure on the area of pain.  She reports waking up in the middle of the night with numbness to her fingers, but no numbness during the day. Has tried ibuprofen as home with little improvement.   Past Medical History:  Diagnosis Date  . Allergy   . HTN (hypertension) 10/29/2015  . Hyperlipidemia   . Vaginal Pap smear, abnormal     Patient Active Problem List   Diagnosis Date Noted  . Morbid obesity (HCC) 01/14/2016  . Hypokalemia 01/14/2016  . Obstructive sleep apnea 01/14/2016  . Palpitations 01/14/2016  . Metabolic syndrome 01/14/2016  . Left hip pain 12/09/2015  . HTN (hypertension) 10/29/2015  . Allergic rhinitis 10/29/2015  . Pain in joint, shoulder region 10/29/2015  . Neck pain 10/29/2015    Past Surgical History:  Procedure Laterality Date  . ABDOMINAL HYSTERECTOMY     Fibroids per patient  . TUBAL LIGATION       OB History    Gravida  6   Para  4   Term  4   Preterm      AB  2   Living        SAB  2   TAB      Ectopic      Multiple      Live Births  4        Obstetric Comments  2 pregnancies after Tubal Lig, both miscarriages per patient           Home Medications    Prior to Admission medications   Medication Sig Start Date End Date Taking? Authorizing Provider  fluticasone (FLONASE) 50 MCG/ACT nasal spray Place 2 sprays into both nostrils daily. 08/23/17   Sharlene Dory, DO  hydrochlorothiazide (HYDRODIURIL) 25 MG tablet TAKE 1 TABLET BY MOUTH EVERY DAY 12/05/17   Wendling, Jilda Roche, DO  hydrochlorothiazide (HYDRODIURIL) 25 MG tablet Take 1 tablet (25 mg total) by mouth daily. Patient not taking: Reported on 04/06/2018 02/02/18   Sharlene Dory, DO  ibuprofen (ADVIL,MOTRIN) 600 MG tablet Take 1 tablet (600 mg total) by mouth every 6 (six) hours as needed. Patient not taking: Reported on 04/06/2018 02/26/18   Levie Heritage, DO  loratadine (CLARITIN) 10 MG tablet Take 10 mg by mouth daily.    [provider]  naproxen (NAPROSYN) 500 MG tablet Take 1 tablet (500 mg total) by mouth 2 (two) times daily as needed. 04/26/18   Ward, Chase Picket, PA-C  potassium chloride SA (KLOR-CON M20) 20 MEQ tablet Take 2 tablets (40 mEq total) by mouth daily. 11/29/17   Donato Schultz, DO  Family History Family History  Problem Relation Age of Onset  . Heart disease Mother   . Kidney disease Mother   . Hypertension Mother   . Hypertension Father     Social History Social History   Tobacco Use  . Smoking status: Never Smoker  . Smokeless tobacco: Never Used  Substance Use Topics  . Alcohol use: No    Alcohol/week: 0.0 oz  . Drug use: No     Allergies   Patient has no known allergies.   Review of Systems Review of Systems  Musculoskeletal: Positive for arthralgias and myalgias.  Skin: Negative for color change and wound.  Neurological: Negative for weakness and numbness.     Physical Exam Updated Vital Signs BP 127/82   Pulse 87   Temp 98.1 F (36.7 C) (Oral)   Resp 18   Ht 5\' 2"  (1.575 m)   Wt 108.4 kg (239 lb)   SpO2 99%   BMI 43.71 kg/m   Physical Exam  Constitutional:  She appears well-developed and well-nourished. No distress.  HENT:  Head: Normocephalic and atraumatic.  Neck: Neck supple.  Cardiovascular: Normal rate, regular rhythm and normal heart sounds.  No murmur heard. Pulmonary/Chest: Effort normal and breath sounds normal. No respiratory distress. She has no wheezes. She has no rales.  Musculoskeletal:  + Finkelstein test -pain significantly exacerbated with ulnar deviation.  Tenderness to palpation along radial aspect of the right wrist.  No anatomical snuffbox tenderness.  Good cap refill.  Sensation intact.  2+ ileal pulse.  No swelling, erythema or warmth.  Neurological: She is alert.  Skin: Skin is warm and dry.  Nursing note and vitals reviewed.    ED Treatments / Results  Labs (all labs ordered are listed, but only abnormal results are displayed) Labs Reviewed - No data to display  EKG None  Radiology Dg Wrist Complete Right  Result Date: 04/26/2018 CLINICAL DATA:  RIGHT wrist pain for 2 weeks. EXAM: RIGHT WRIST - COMPLETE 3+ VIEW COMPARISON:  None. FINDINGS: No acute fracture, subluxation or dislocation. 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 No focal bony lesions are present. IMPRESSION: 1. No evidence of acute abnormality 2. 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 Electronically Signed   By: Harmon Pier M.D.   On: 04/26/2018 19:15    Procedures Procedures (including critical care time)  Medications Ordered in ED Medications  naproxen (NAPROSYN) tablet 500 mg (500 mg Oral Given 04/26/18 1832)     Initial Impression / Assessment and Plan / ED Course  I have reviewed the triage vital signs and the nursing notes.  Pertinent labs & imaging results that were available during my care of the patient were reviewed by me and considered in  my medical decision making (see chart for details).    Dana Patterson is a 42 y.o. female who presents to ED for progressively worsening right wrist pain over the last week or so.  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  NVI on exam.  She does have tenderness to the right wrist along the radial styloid.  She also has increase in pain with Finkelstein's test.  Exam is consistent with de Quervain's tenosynovitis.  X-ray was obtained showing no acute abnormality.  She does incidentally have 5 mm calcification along the lateral first carpometacarpal joint.  Discussed this with patient.  Symptomatic home care instructions discussed, orthopedic follow-up encouraged if no improvement.  Wrist splint provided in ED.  Reasons to return to ER were discussed and all questions answered.  Work note provided to rest the hand over the next few days given her job duties.  Final Clinical Impressions(s) / ED Diagnoses   Final diagnoses:  Tendonitis    ED Discharge Orders        Ordered    naproxen (NAPROSYN) 500 MG tablet  2 times daily PRN     04/26/18 1921       Ward, Chase PicketJaime Pilcher, PA-C 04/26/18 1937    Little, Ambrose Finlandachel Morgan, MD 04/30/18 [phone]

## 2018-05-07 ENCOUNTER — Other Ambulatory Visit: Payer: Self-pay | Admitting: Family Medicine

## 2018-05-07 DIAGNOSIS — Z889 Allergy status to unspecified drugs, medicaments and biological substances status: Secondary | ICD-10-CM

## 2018-05-07 DIAGNOSIS — I10 Essential (primary) hypertension: Secondary | ICD-10-CM

## 2018-07-28 ENCOUNTER — Other Ambulatory Visit: Payer: Self-pay | Admitting: Family Medicine

## 2018-07-28 DIAGNOSIS — I10 Essential (primary) hypertension: Secondary | ICD-10-CM

## 2018-08-16 ENCOUNTER — Other Ambulatory Visit (HOSPITAL_COMMUNITY)
Admission: RE | Admit: 2018-08-16 | Discharge: 2018-08-16 | Disposition: A | Discharge status: Home / Self Care | Payer: Self-pay | Source: Ambulatory Visit | Attending: Family Medicine | Admitting: Family Medicine

## 2018-08-16 ENCOUNTER — Encounter: Payer: Self-pay | Admitting: Family Medicine

## 2018-08-16 ENCOUNTER — Ambulatory Visit (INDEPENDENT_AMBULATORY_CARE_PROVIDER_SITE_OTHER): Payer: Self-pay | Admitting: Family Medicine

## 2018-08-16 VITALS — BP 132/82 | HR 110 | Temp 98.5°F | Resp 16 | Ht 62.0 in | Wt 242.6 lb

## 2018-08-16 DIAGNOSIS — R350 Frequency of micturition: Secondary | ICD-10-CM

## 2018-08-16 LAB — POC URINALSYSI DIPSTICK (AUTOMATED)
Bilirubin, UA: NEGATIVE
Blood, UA: NEGATIVE
Glucose, UA: NEGATIVE
KETONES UA: NEGATIVE
Leukocytes, UA: NEGATIVE
NITRITE UA: NEGATIVE
PH UA: 6 (ref 5.0–8.0)
PROTEIN UA: NEGATIVE
Spec Grav, UA: >=1.03 — AB (ref 1.010–1.025)
UROBILINOGEN UA: 0.2 U/dL

## 2018-08-16 NOTE — Patient Instructions (Signed)

## 2018-08-16 NOTE — Progress Notes (Signed)
Patient ID: Dana Patterson, female    DOB: [birth year]  Age: 42 y.o. MRN: [id number]    Subjective:  Subjective  HPI Dana Patterson presents for urge incontinence   Review of Systems  Constitutional: Negative for chills and fever.  HENT: Negative for congestion and hearing loss.   Eyes: Negative for discharge.  Respiratory: Negative for cough and shortness of breath.   Cardiovascular: Negative for chest pain, palpitations and leg swelling.  Gastrointestinal: Negative for abdominal pain, blood in stool, constipation, diarrhea, nausea and vomiting.  Genitourinary: Positive for frequency, urgency and vaginal discharge. Negative for dysuria and hematuria.  Musculoskeletal: Negative for back pain and myalgias.  Skin: Negative for rash.  Allergic/Immunologic: Negative for environmental allergies.  Neurological: Negative for dizziness, weakness and headaches.  Hematological: Does not bruise/bleed easily.  Psychiatric/Behavioral: Negative for suicidal ideas. The patient is not nervous/anxious.     History Past Medical History:  Diagnosis Date  . Allergy   . HTN (hypertension) 10/29/2015  . Hyperlipidemia   . Vaginal Pap smear, abnormal     She has a past surgical history that includes Abdominal hysterectomy and Tubal ligation.   Her family history includes Heart disease in her mother; Hypertension in her father and mother; Kidney disease in her mother.She reports that she has never smoked. She has never used smokeless tobacco. She reports that she does not drink alcohol or use drugs.  Current Outpatient Medications on File Prior to Visit  Medication Sig Dispense Refill  . fluticasone (FLONASE) 50 MCG/ACT nasal spray Place 2 sprays into both nostrils daily. 16 g 0  . hydrochlorothiazide (HYDRODIURIL) 25 MG tablet Take 1 tablet (25 mg total) by mouth daily. Needs to follow up visit before any more refills 90 tablet 0  . ibuprofen (ADVIL,MOTRIN) 600 MG tablet Take 1 tablet (600 mg total)  by mouth every 6 (six) hours as needed. 60 tablet 3  . loratadine (CLARITIN) 10 MG tablet Take 10 mg by mouth daily.    Marland Kitchen loratadine (CLARITIN) 10 MG tablet TAKE 1 TABLET BY MOUTH EVERY DAY 30 tablet 4  . potassium chloride SA (KLOR-CON M20) 20 MEQ tablet Take 2 tablets (40 mEq total) by mouth daily. 180 tablet 0   No current facility-administered medications on file prior to visit.      Objective:  Objective  Physical Exam BP 132/82 (BP Location: Right Arm, Cuff Size: Large)   Pulse (!) 110   Temp 98.5 F (36.9 C) (Oral)   Resp 16   Ht 5\' 2"  (1.575 m)   Wt 242 lb 9.6 oz (110 kg)   SpO2 97%   BMI 44.37 kg/m  Wt Readings from Last 3 Encounters:  08/16/18 242 lb 9.6 oz (110 kg)  04/26/18 239 lb (108.4 kg)  04/06/18 239 lb (108.4 kg)     Lab Results  Component Value Date   WBC 5.0 08/23/2017   HGB 13.2 08/23/2017   HCT 39.1 08/23/2017   PLT 261.0 08/23/2017   GLUCOSE 94 08/23/2017   CHOL 208 (H) 05/03/2016   TRIG 121.0 05/03/2016   HDL 53.30 05/03/2016   LDLCALC 130 (H) 05/03/2016   ALT 29 08/23/2017   AST 17 08/23/2017   NA 137 08/23/2017   K 3.9 08/23/2017   CL 101 08/23/2017   CREATININE 0.66 08/23/2017   BUN 10 08/23/2017   CO2 28 08/23/2017   TSH 1.200 11/17/2015   HGBA1C 5.8 05/03/2016   MICROALBUR <0.7 05/03/2016    Dg Wrist Complete Right  Result  Date: 04/26/2018 CLINICAL DATA:  RIGHT wrist pain for 2 weeks. EXAM: RIGHT WRIST - COMPLETE 3+ VIEW COMPARISON:  None. FINDINGS: No acute fracture, subluxation or dislocation. 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 No focal bony lesions are present. IMPRESSION: 1. No evidence of acute abnormality 2. 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 Electronically Signed   By: Harmon Pier M.D.   On: 04/26/2018 19:15     Assessment  & Plan:  Plan  I have discontinued Dana Patterson's naproxen. I am also having her maintain her fluticasone, potassium chloride SA, loratadine, ibuprofen, loratadine, and hydrochlorothiazide.  No orders of the defined types were placed in this encounter.   Problem List Items Addressed This Visit    None    Visit Diagnoses    Urine frequency    -  Primary   Relevant Orders   Urine Culture   Urine cytology ancillary only    d/w pt kegel exercises  Check culture and urine ancillary  Follow-up: Return if symptoms worsen or fail to improve.  Donato Schultz, DO

## 2018-08-16 NOTE — Addendum Note (Signed)
Addended by: Thelma Barge D on: 08/16/2018 01:51 PM   Modules accepted: Orders

## 2018-08-18 LAB — URINE CULTURE
MICRO NUMBER:: 91158693
SPECIMEN QUALITY:: ADEQUATE

## 2018-08-19 ENCOUNTER — Other Ambulatory Visit: Payer: Self-pay | Admitting: Family Medicine

## 2018-08-19 DIAGNOSIS — N390 Urinary tract infection, site not specified: Secondary | ICD-10-CM

## 2018-08-19 MED ORDER — SULFAMETHOXAZOLE-TRIMETHOPRIM 800-160 MG PO TABS: 1.0000 | ORAL_TABLET | Freq: Two times a day (BID) | ORAL | 0 refills | Status: DC

## 2018-08-20 LAB — URINE CYTOLOGY ANCILLARY ONLY
Chlamydia: NEGATIVE
Neisseria Gonorrhea: NEGATIVE
Trichomonas: NEGATIVE

## 2018-08-22 LAB — URINE CYTOLOGY ANCILLARY ONLY
Bacterial vaginitis: POSITIVE — AB
Candida vaginitis: NEGATIVE

## 2018-08-28 ENCOUNTER — Telehealth: Payer: Self-pay | Admitting: Family Medicine

## 2018-08-28 MED ORDER — METRONIDAZOLE 500 MG PO TABS: 500.0000 mg | ORAL_TABLET | Freq: Two times a day (BID) | ORAL | 0 refills | Status: AC

## 2018-08-28 NOTE — Telephone Encounter (Signed)
Patient notified of labs and medication sent in.

## 2018-08-28 NOTE — Telephone Encounter (Signed)
Copied from CRM [id number]. Topic: Quick Communication - See Telephone Encounter >> Aug 28, 2018  8:24 AM Windy Kalata, NT wrote: CRM for notification. See Telephone encounter for: 08/28/18.  Patient is calling and states that she received a mychart message yesterday stating that a test result had came back and a cream was called in. I do not see where any medication was called in yesterday and patient states she would like to know if she is supposed to go and get that medicine. Requesting Dr. Reynolds Bowl nurse to call her back.

## 2018-10-27 ENCOUNTER — Other Ambulatory Visit: Payer: Self-pay | Admitting: Family Medicine

## 2018-10-27 DIAGNOSIS — I10 Essential (primary) hypertension: Secondary | ICD-10-CM

## 2018-11-22 ENCOUNTER — Other Ambulatory Visit: Payer: Self-pay

## 2018-11-22 ENCOUNTER — Emergency Department (HOSPITAL_BASED_OUTPATIENT_CLINIC_OR_DEPARTMENT_OTHER): Payer: Self-pay

## 2018-11-22 ENCOUNTER — Emergency Department (HOSPITAL_BASED_OUTPATIENT_CLINIC_OR_DEPARTMENT_OTHER)
Admission: EM | Admit: 2018-11-22 | Discharge: 2018-11-22 | Disposition: A | Discharge status: Home / Self Care | Payer: Self-pay | Attending: Emergency Medicine | Admitting: Emergency Medicine

## 2018-11-22 ENCOUNTER — Encounter (HOSPITAL_BASED_OUTPATIENT_CLINIC_OR_DEPARTMENT_OTHER): Payer: Self-pay | Admitting: Emergency Medicine

## 2018-11-22 DIAGNOSIS — M62830 Muscle spasm of back: Secondary | ICD-10-CM | POA: Insufficient documentation

## 2018-11-22 DIAGNOSIS — I10 Essential (primary) hypertension: Secondary | ICD-10-CM | POA: Insufficient documentation

## 2018-11-22 DIAGNOSIS — Z79899 Other long term (current) drug therapy: Secondary | ICD-10-CM | POA: Insufficient documentation

## 2018-11-22 LAB — COMPREHENSIVE METABOLIC PANEL
ALBUMIN: 4.4 g/dL (ref 3.5–5.0)
ALT: 51 U/L — ABNORMAL HIGH (ref 0–44)
AST: 40 U/L (ref 15–41)
Alkaline Phosphatase: 73 U/L (ref 38–126)
Anion gap: 11 (ref 5–15)
BILIRUBIN TOTAL: 0.2 mg/dL — AB (ref 0.3–1.2)
BUN: 15 mg/dL (ref 6–20)
CALCIUM: 10 mg/dL (ref 8.9–10.3)
CO2: 25 mmol/L (ref 22–32)
Chloride: 100 mmol/L (ref 98–111)
Creatinine, Ser: 0.66 mg/dL (ref 0.44–1.00)
Glucose, Bld: 90 mg/dL (ref 70–99)
POTASSIUM: 3.6 mmol/L (ref 3.5–5.1)
Sodium: 136 mmol/L (ref 135–145)
TOTAL PROTEIN: 8 g/dL (ref 6.5–8.1)

## 2018-11-22 LAB — CBC WITH DIFFERENTIAL/PLATELET
Abs Immature Granulocytes: 0.02 10*3/uL (ref 0.00–0.07)
BASOS ABS: 0 10*3/uL (ref 0.0–0.1)
Basophils Relative: 1 %
EOS ABS: 0.2 10*3/uL (ref 0.0–0.5)
EOS PCT: 2 %
HEMATOCRIT: 41.6 % (ref 36.0–46.0)
Hemoglobin: 13.7 g/dL (ref 12.0–15.0)
Immature Granulocytes: 0 %
LYMPHS ABS: 3.5 10*3/uL (ref 0.7–4.0)
Lymphocytes Relative: 45 %
MCH: 30 pg (ref 26.0–34.0)
MCHC: 32.9 g/dL (ref 30.0–36.0)
MCV: 91 fL (ref 80.0–100.0)
MONO ABS: 0.6 10*3/uL (ref 0.1–1.0)
Monocytes Relative: 8 %
NRBC: 0 % (ref 0.0–0.2)
Neutro Abs: 3.3 10*3/uL (ref 1.7–7.7)
Neutrophils Relative %: 44 %
Platelets: 275 10*3/uL (ref 150–400)
RBC: 4.57 MIL/uL (ref 3.87–5.11)
RDW: 12.9 % (ref 11.5–15.5)
WBC: 7.6 10*3/uL (ref 4.0–10.5)

## 2018-11-22 LAB — LIPASE, BLOOD: LIPASE: 34 U/L (ref 11–51)

## 2018-11-22 LAB — TROPONIN I

## 2018-11-22 LAB — D-DIMER, QUANTITATIVE (NOT AT ARMC): D DIMER QUANT: 0.32 ug{FEU}/mL (ref 0.00–0.50)

## 2018-11-22 IMAGING — CR DG CHEST 2V
2 series · 2 of 2 positions shown · non-contrast
Comparison: [DATE]

CLINICAL DATA: Chest pain

EXAM:
CHEST - 2 VIEW

[w chest pa]
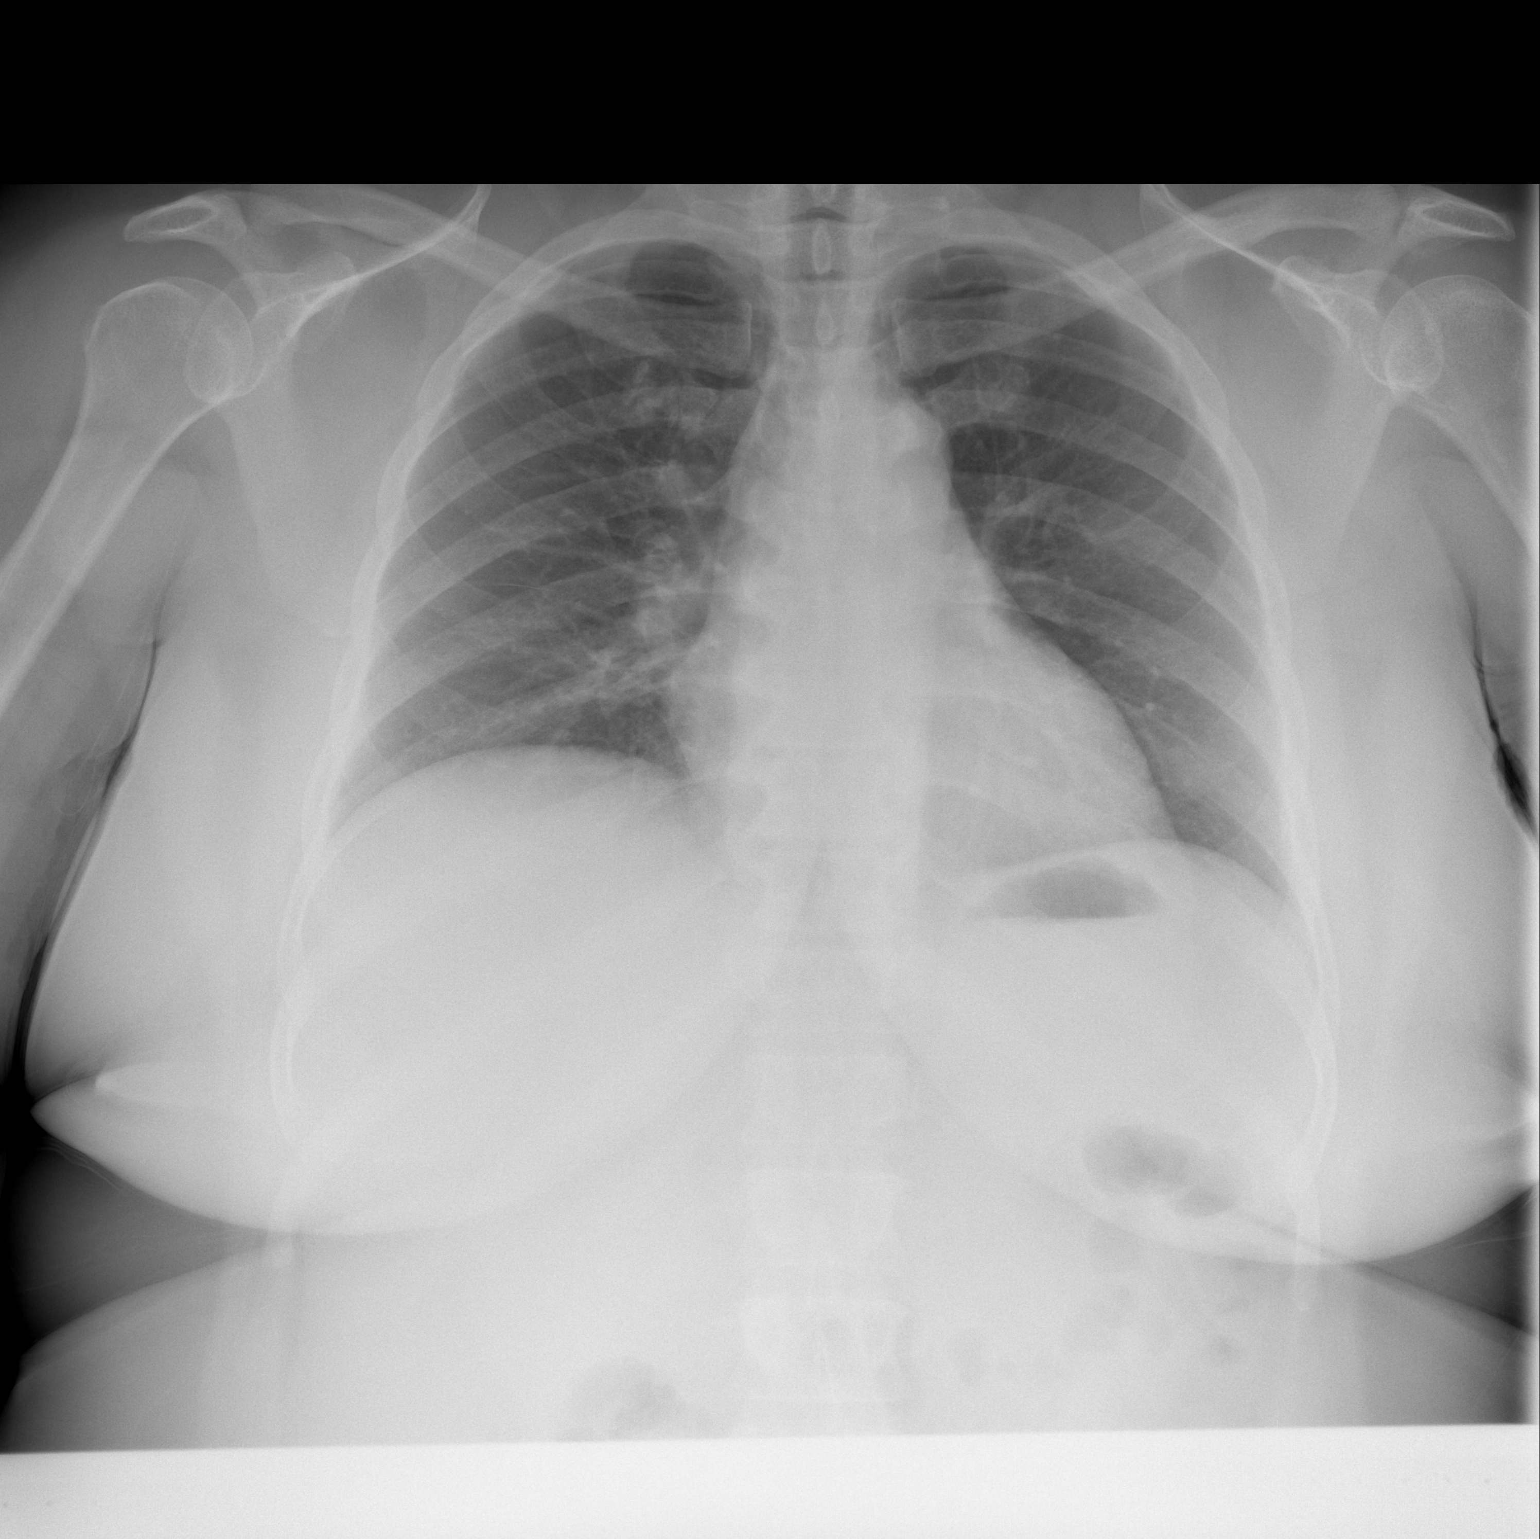

[w chest lat *]
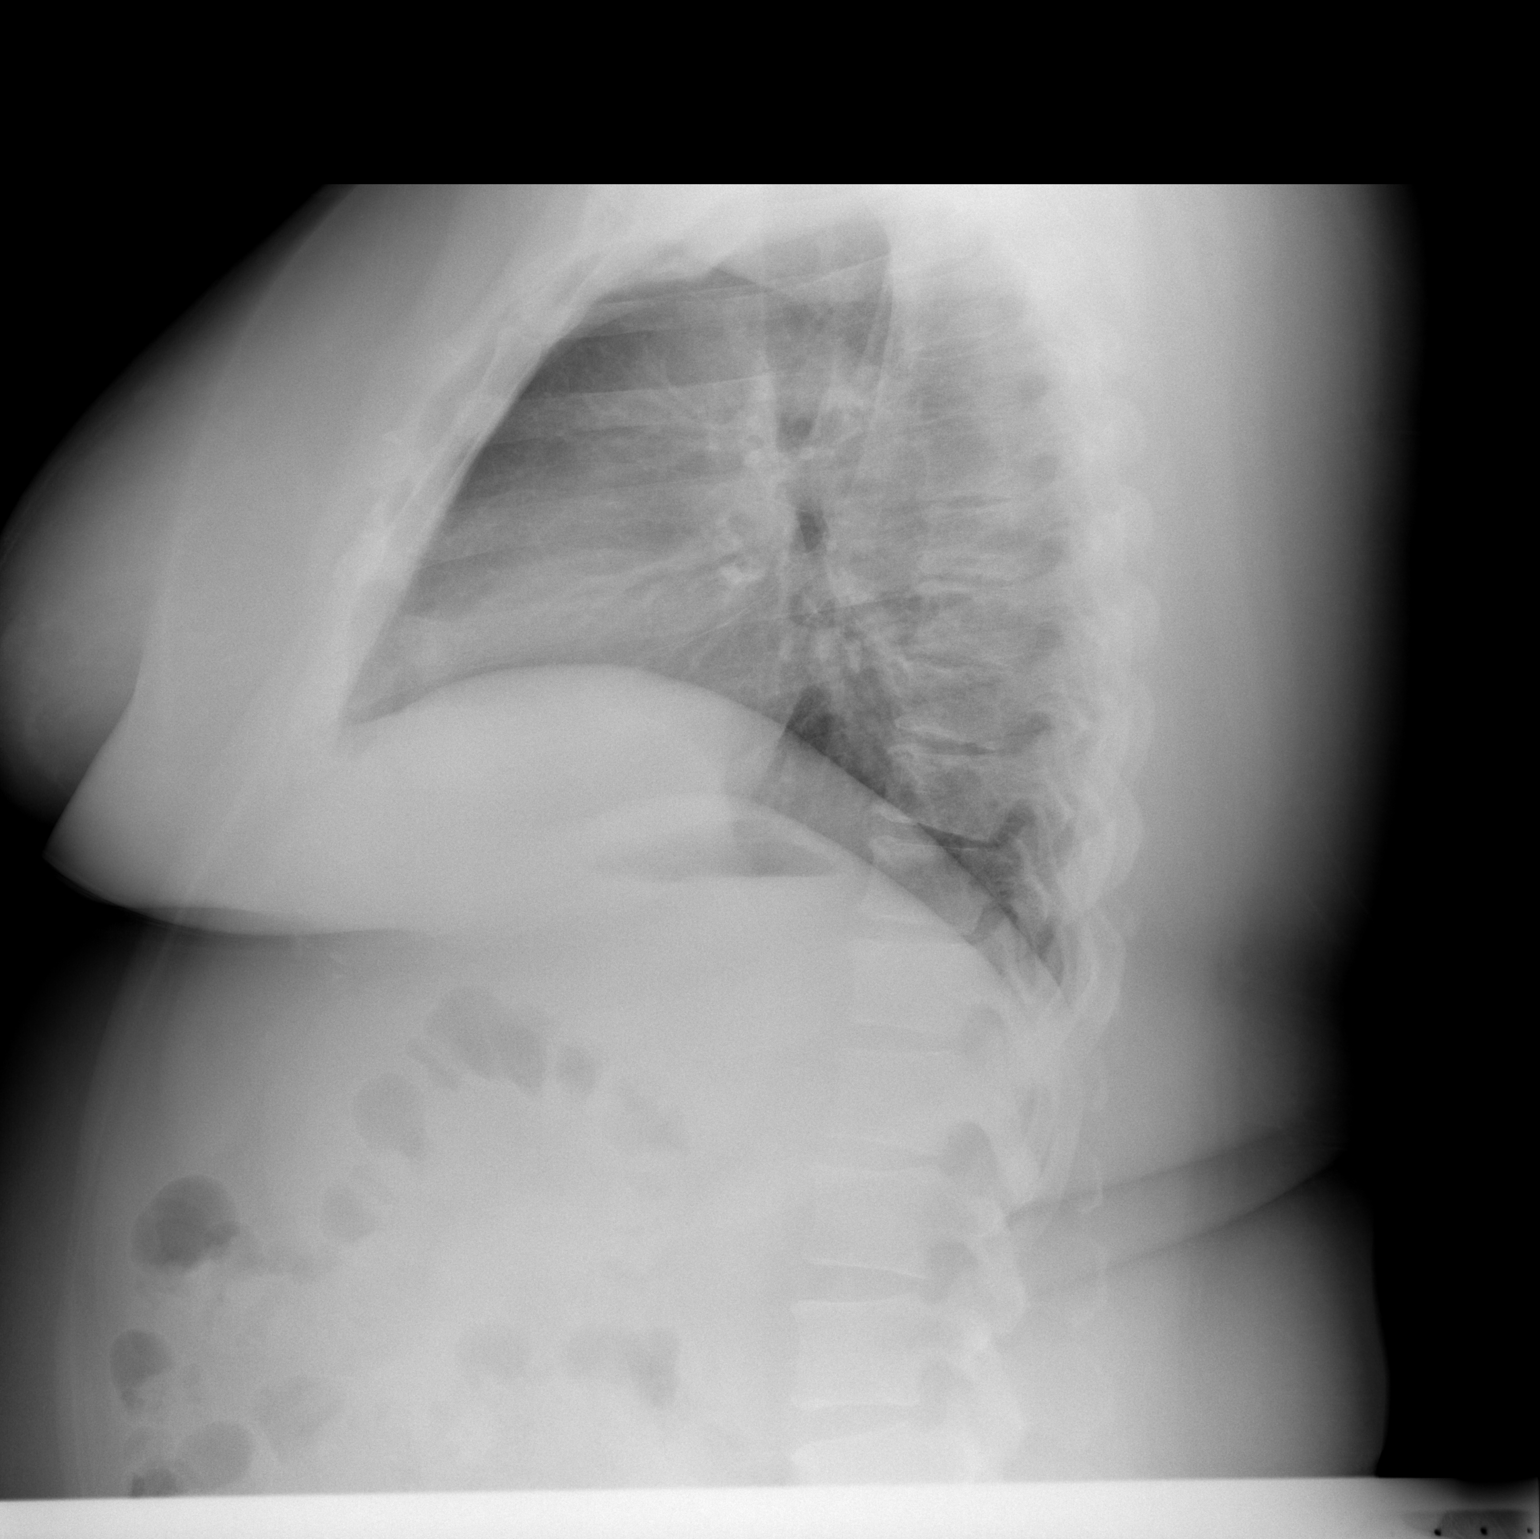

[2 of 2 positions shown; findings below may reference images not displayed]

FINDINGS: The heart size and mediastinal contours are within normal limits.
Both lungs are clear. The visualized skeletal structures are
unremarkable.
IMPRESSION: No active cardiopulmonary disease.

## 2018-11-22 MED ORDER — KETOROLAC TROMETHAMINE 30 MG/ML IJ SOLN
30.0000 mg | Freq: Once | INTRAMUSCULAR | Status: AC
  Administered 2018-11-22: 30 mg via INTRAVENOUS
  Filled 2018-11-22: qty 1

## 2018-11-22 MED ORDER — METHOCARBAMOL 500 MG PO TABS: 1000.0000 mg | ORAL_TABLET | Freq: Three times a day (TID) | ORAL | 0 refills | Status: DC | PRN

## 2018-11-22 MED ORDER — METHOCARBAMOL 500 MG PO TABS
1000.0000 mg | ORAL_TABLET | Freq: Once | ORAL | Status: AC
  Administered 2018-11-22: 1000 mg via ORAL
  Filled 2018-11-22: qty 2

## 2018-11-22 MED ORDER — IBUPROFEN 600 MG PO TABS: 600.0000 mg | ORAL_TABLET | Freq: Four times a day (QID) | ORAL | 0 refills | Status: DC | PRN

## 2018-11-22 MED ORDER — KETOROLAC TROMETHAMINE 60 MG/2ML IM SOLN: 60.0000 mg | Freq: Once | INTRAMUSCULAR | Status: DC

## 2018-11-22 NOTE — ED Triage Notes (Signed)
Reports upper back pain which radiates under right breast and into chest.  Patient reports tenderness on palpation.  Reports pain x 2 weeks. Denies injury.

## 2018-11-22 NOTE — ED Provider Notes (Signed)
[facility] EMERGENCY DEPARTMENT Provider Note   CSN: [id number] Arrival date & time: 11/22/18  1739     History   Chief Complaint Chief Complaint  Patient presents with  . Back Pain    HPI Dana KitchensStephanie Patterson is a 43 y.o. female.  HPI Patient presents with greater than 1 month of bilateral thoracic back pain which radiates to her right chest.  No known trauma.  No shortness of breath.  No coughing.  No fever or chills.  No recent extended travel or immobilization.  No new lower extremity swelling or pain.  Pain is worse with deep breathing and movement.  Patient states she has some tingling to the fingers of the left hand.  No focal weakness. Past Medical History:  Diagnosis Date  . Allergy   . HTN (hypertension) 10/29/2015  . Hyperlipidemia   . Vaginal Pap smear, abnormal     Patient Active Problem List   Diagnosis Date Noted  . Morbid obesity (HCC) 01/14/2016  . Hypokalemia 01/14/2016  . Obstructive sleep apnea 01/14/2016  . Palpitations 01/14/2016  . Metabolic syndrome 01/14/2016  . Left hip pain 12/09/2015  . HTN (hypertension) 10/29/2015  . Allergic rhinitis 10/29/2015  . Pain in joint, shoulder region 10/29/2015  . Neck pain 10/29/2015    Past Surgical History:  Procedure Laterality Date  . ABDOMINAL HYSTERECTOMY     Fibroids per patient  . TUBAL LIGATION       OB History    Gravida  6   Para  4   Term  4   Preterm      AB  2   Living        SAB  2   TAB      Ectopic      Multiple      Live Births  4        Obstetric Comments  2 pregnancies after Tubal Lig, both miscarriages per patient         Home Medications    Prior to Admission medications   Medication Sig Start Date End Date Taking? Authorizing Provider  fluticasone (FLONASE) 50 MCG/ACT nasal spray Place 2 sprays into both nostrils daily. 08/23/17   Sharlene DoryWendling, Nicholas Paul, DO  hydrochlorothiazide (HYDRODIURIL) 25 MG tablet TAKE 1 TABLET (25 MG TOTAL) BY MOUTH  DAILY. NEEDS TO FOLLOW UP VISIT BEFORE ANY MORE REFILLS 11/09/18   Zola ButtonLowne Chase, Myrene BuddyYvonne R, DO  ibuprofen (ADVIL,MOTRIN) 600 MG tablet Take 1 tablet (600 mg total) by mouth every 6 (six) hours as needed. 11/22/18   Loren RacerYelverton, Madgeline Rayo, MD  loratadine (CLARITIN) 10 MG tablet Take 10 mg by mouth daily.    [provider]  loratadine (CLARITIN) 10 MG tablet TAKE 1 TABLET BY MOUTH EVERY DAY 05/07/18   Zola ButtonLowne Chase, Grayling CongressYvonne R, DO  methocarbamol (ROBAXIN) 500 MG tablet Take 2 tablets (1,000 mg total) by mouth every 8 (eight) hours as needed for muscle spasms. 11/22/18   Loren RacerYelverton, Kaja Jackowski, MD  potassium chloride SA (KLOR-CON M20) 20 MEQ tablet Take 2 tablets (40 mEq total) by mouth daily. 11/29/17   Donato SchultzLowne Chase, Yvonne R, DO  sulfamethoxazole-trimethoprim (BACTRIM DS,SEPTRA DS) 800-160 MG tablet Take 1 tablet by mouth 2 (two) times daily. 08/19/18   Donato SchultzLowne Chase, Yvonne R, DO    Family History Family History  Problem Relation Age of Onset  . Heart disease Mother   . Kidney disease Mother   . Hypertension Mother   . Hypertension Father  Social History Social History   Tobacco Use  . Smoking status: Never Smoker  . Smokeless tobacco: Never Used  Substance Use Topics  . Alcohol use: No    Alcohol/week: 0.0 standard drinks  . Drug use: No     Allergies   Patient has no known allergies.   Review of Systems Review of Systems  Constitutional: Negative for chills and fever.  HENT: Negative for facial swelling, sore throat and trouble swallowing.   Eyes: Negative for visual disturbance.  Respiratory: Negative for cough and shortness of breath.   Cardiovascular: Positive for chest pain. Negative for palpitations and leg swelling.  Gastrointestinal: Negative for abdominal pain, constipation, diarrhea, nausea and vomiting.  Genitourinary: Negative for dysuria, flank pain and frequency.  Musculoskeletal: Positive for back pain, myalgias and neck pain. Negative for neck stiffness.  Skin: Negative  for rash and wound.  Neurological: Positive for numbness. Negative for dizziness, speech difficulty, weakness, light-headedness and headaches.  All other systems reviewed and are negative.    Physical Exam Updated Vital Signs BP 128/90 (BP Location: Left Arm)   Pulse 95   Temp 98.4 F (36.9 C) (Oral)   Resp 20   Ht 5\' 2"  (1.575 m)   Wt 104.3 kg   SpO2 98%   BMI 42.07 kg/m   Physical Exam Vitals signs and nursing note reviewed.  Constitutional:      General: She is not in acute distress.    Appearance: Normal appearance. She is well-developed. She is not ill-appearing or toxic-appearing.  HENT:     Head: Normocephalic and atraumatic.     Nose: Nose normal. No congestion.     Mouth/Throat:     Mouth: Mucous membranes are moist.     Pharynx: No oropharyngeal exudate or posterior oropharyngeal erythema.  Eyes:     Extraocular Movements: Extraocular movements intact.     Conjunctiva/sclera: Conjunctivae normal.     Pupils: Pupils are equal, round, and reactive to light.  Neck:     Musculoskeletal: Normal range of motion and neck supple. Muscular tenderness present.     Comments: No midline posterior cervical tenderness palpation.  Patient has tenderness and spasm of the left trapezius muscle.  No cervical lymphadenopathy.  No meningismus. Cardiovascular:     Rate and Rhythm: Normal rate and regular rhythm.     Heart sounds: No murmur. No friction rub. No gallop.   Pulmonary:     Effort: Pulmonary effort is normal. No respiratory distress.     Breath sounds: Normal breath sounds. No stridor. No wheezing, rhonchi or rales.     Comments: Right sided chest wall tenderness to palpation.  No crepitance or deformity. Chest:     Chest wall: Tenderness present.  Abdominal:     General: Bowel sounds are normal.     Palpations: Abdomen is soft.     Tenderness: There is no abdominal tenderness. There is no guarding or rebound.  Musculoskeletal: Normal range of motion.         General: Tenderness present.     Comments: Diffuse thoracic muscular tenderness to palpation.  No definite midline thoracic or lumbar tenderness.  No lower extremity swelling, asymmetry or tenderness.  Distal pulses are 2+.  Negative Tinel and Phalen sign bilaterally.  Skin:    General: Skin is warm and dry.     Capillary Refill: Capillary refill takes less than 2 seconds.     Findings: No erythema or rash.  Neurological:     General: No focal  deficit present.     Mental Status: She is alert and oriented to person, place, and time.  Psychiatric:        Mood and Affect: Mood normal.        Behavior: Behavior normal.      ED Treatments / Results  Labs (all labs ordered are listed, but only abnormal results are displayed) Labs Reviewed  COMPREHENSIVE METABOLIC PANEL - Abnormal; Notable for the following components:      Result Value   ALT 51 (*)    Total Bilirubin 0.2 (*)    All other components within normal limits  CBC WITH DIFFERENTIAL/PLATELET  LIPASE, BLOOD  TROPONIN I  D-DIMER, QUANTITATIVE ([facility])    EKG EKG Interpretation  Date/Time:  Thursday November 22 2018 17:59:35 EST Ventricular Rate:  104 PR Interval:  170 QRS Duration: 78 QT Interval:  344 QTC Calculation: 452 R Axis:   81 Text Interpretation:  Sinus tachycardia T wave abnormality, consider inferior ischemia Abnormal ECG Confirmed by Loren RacerYelverton, Warrick Llera ([id number]) on 11/22/2018 7:36:06 PM   Radiology Dg Chest 2 View  Result Date: 11/22/2018 CLINICAL DATA:  Chest pain EXAM: CHEST - 2 VIEW COMPARISON:  12/24/2017 FINDINGS: The heart size and mediastinal contours are within normal limits. Both lungs are clear. The visualized skeletal structures are unremarkable. IMPRESSION: No active cardiopulmonary disease. Electronically Signed   By: Jasmine PangKim  Fujinaga M.D.   On: 11/22/2018 20:25    Procedures Procedures (including critical care time)  Medications Ordered in ED Medications  methocarbamol (ROBAXIN) tablet  1,000 mg (1,000 mg Oral Given 11/22/18 1952)  ketorolac (TORADOL) 30 MG/ML injection 30 mg (30 mg Intravenous Given 11/22/18 1953)     Initial Impression / Assessment and Plan / ED Course  I have reviewed the triage vital signs and the nursing notes.  Pertinent labs & imaging results that were available during my care of the patient were reviewed by me and considered in my medical decision making (see chart for details).     Patient with muscle spasms reproduced with palpation.  Also suspect possible cervical radiculopathy on the left.  Will treat symptomatically.  Strict return precautions given.  Final Clinical Impressions(s) / ED Diagnoses   Final diagnoses:  Muscle spasm of back    ED Discharge Orders         Ordered    ibuprofen (ADVIL,MOTRIN) 600 MG tablet  Every 6 hours PRN     11/22/18 2049    methocarbamol (ROBAXIN) 500 MG tablet  Every 8 hours PRN     11/22/18 2049           Loren RacerYelverton, Nitesh Pitstick, MD 11/22/18 2105

## 2018-11-22 NOTE — ED Notes (Signed)
C/o pain and soreness to back, flanks, and chest that radiates from one area to another x 1 month plus,  Has been seen for same in past

## 2019-02-11 ENCOUNTER — Other Ambulatory Visit: Payer: Self-pay | Admitting: Family Medicine

## 2019-02-11 DIAGNOSIS — I10 Essential (primary) hypertension: Secondary | ICD-10-CM

## 2019-02-11 MED ORDER — HYDROCHLOROTHIAZIDE 25 MG PO TABS: 25.0000 mg | ORAL_TABLET | Freq: Every day | ORAL | 0 refills | Status: DC

## 2019-02-12 ENCOUNTER — Other Ambulatory Visit: Payer: Self-pay | Admitting: Family Medicine

## 2019-02-12 DIAGNOSIS — E876 Hypokalemia: Secondary | ICD-10-CM

## 2019-03-09 ENCOUNTER — Other Ambulatory Visit: Payer: Self-pay | Admitting: Family Medicine

## 2019-03-09 DIAGNOSIS — Z889 Allergy status to unspecified drugs, medicaments and biological substances status: Secondary | ICD-10-CM

## 2019-03-11 ENCOUNTER — Encounter: Payer: Self-pay | Admitting: *Deleted

## 2019-05-03 ENCOUNTER — Other Ambulatory Visit: Payer: Self-pay | Admitting: Family Medicine

## 2019-05-03 DIAGNOSIS — I10 Essential (primary) hypertension: Secondary | ICD-10-CM

## 2019-05-09 ENCOUNTER — Ambulatory Visit (INDEPENDENT_AMBULATORY_CARE_PROVIDER_SITE_OTHER): Payer: Self-pay | Admitting: Family Medicine

## 2019-05-09 ENCOUNTER — Encounter: Payer: Self-pay | Admitting: Family Medicine

## 2019-05-09 ENCOUNTER — Other Ambulatory Visit (INDEPENDENT_AMBULATORY_CARE_PROVIDER_SITE_OTHER): Payer: Self-pay

## 2019-05-09 VITALS — Temp 92.6°F | Ht 62.0 in

## 2019-05-09 DIAGNOSIS — R350 Frequency of micturition: Secondary | ICD-10-CM

## 2019-05-09 LAB — URINALYSIS
Bilirubin Urine: NEGATIVE
Hgb urine dipstick: NEGATIVE
Ketones, ur: NEGATIVE
Leukocytes,Ua: NEGATIVE
Nitrite: NEGATIVE
Specific Gravity, Urine: 1.015 (ref 1.000–1.030)
Total Protein, Urine: NEGATIVE
Urine Glucose: NEGATIVE
Urobilinogen, UA: 0.2 (ref 0.0–1.0)
pH: 5.5 (ref 5.0–8.0)

## 2019-05-09 MED ORDER — CIPROFLOXACIN HCL 250 MG PO TABS: 250.0000 mg | ORAL_TABLET | Freq: Two times a day (BID) | ORAL | 0 refills | Status: DC

## 2019-05-09 NOTE — Progress Notes (Signed)
Virtual Visit via Video Note  I connected with Dana Patterson on 05/09/19 at  1:15 PM EDT by a video enabled telemedicine application and verified that I am speaking with the correct person using two identifiers.  Location: Patient: home  Provider: home    I discussed the limitations of evaluation and management by telemedicine and the availability of in person appointments. The patient expressed understanding and agreed to proceed.  History of Present Illness: Pt is home c/o R flank pain that radiates down leg to R knee She is also c/o urinary frequency    Observations/Objective: Vitals:   05/09/19 1316  Temp: (!) 92.6 F (33.7 C)  pt in NAD   Assessment and Plan: 1. Urinary frequency Check urine  rto prn  - Urine Culture; Future - Urinalysis; Future - ciprofloxacin (CIPRO) 250 MG tablet; Take 1 tablet (250 mg total) by mouth 2 (two) times daily.  Dispense: 6 tablet; Refill: 0   Follow Up Instructions:    I discussed the assessment and treatment plan with the patient. The patient was provided an opportunity to ask questions and all were answered. The patient agreed with the plan and demonstrated an understanding of the instructions.   The patient was advised to call back or seek an in-person evaluation if the symptoms worsen or if the condition fails to improve as anticipated.  I provided 15 minutes of non-face-to-face time during this encounter.   Ann Held, DO

## 2019-05-11 LAB — URINE CULTURE
MICRO NUMBER:: 583926
SPECIMEN QUALITY:: ADEQUATE

## 2019-06-17 ENCOUNTER — Other Ambulatory Visit: Payer: Self-pay | Admitting: Family Medicine

## 2019-06-17 DIAGNOSIS — Z889 Allergy status to unspecified drugs, medicaments and biological substances status: Secondary | ICD-10-CM

## 2019-06-30 ENCOUNTER — Other Ambulatory Visit: Payer: Self-pay | Admitting: Family Medicine

## 2019-06-30 DIAGNOSIS — I10 Essential (primary) hypertension: Secondary | ICD-10-CM

## 2019-08-08 ENCOUNTER — Other Ambulatory Visit: Payer: Self-pay

## 2019-08-08 ENCOUNTER — Emergency Department (HOSPITAL_BASED_OUTPATIENT_CLINIC_OR_DEPARTMENT_OTHER): Payer: Self-pay

## 2019-08-08 ENCOUNTER — Emergency Department (HOSPITAL_BASED_OUTPATIENT_CLINIC_OR_DEPARTMENT_OTHER)
Admission: EM | Admit: 2019-08-08 | Discharge: 2019-08-08 | Disposition: A | Discharge status: Home / Self Care | Payer: Self-pay | Attending: Emergency Medicine | Admitting: Emergency Medicine

## 2019-08-08 ENCOUNTER — Encounter (HOSPITAL_BASED_OUTPATIENT_CLINIC_OR_DEPARTMENT_OTHER): Payer: Self-pay | Admitting: *Deleted

## 2019-08-08 DIAGNOSIS — Z79899 Other long term (current) drug therapy: Secondary | ICD-10-CM | POA: Insufficient documentation

## 2019-08-08 DIAGNOSIS — I10 Essential (primary) hypertension: Secondary | ICD-10-CM | POA: Insufficient documentation

## 2019-08-08 DIAGNOSIS — E785 Hyperlipidemia, unspecified: Secondary | ICD-10-CM | POA: Insufficient documentation

## 2019-08-08 DIAGNOSIS — M25512 Pain in left shoulder: Secondary | ICD-10-CM | POA: Insufficient documentation

## 2019-08-08 LAB — URINALYSIS, ROUTINE W REFLEX MICROSCOPIC
Bilirubin Urine: NEGATIVE
Glucose, UA: NEGATIVE mg/dL
Hgb urine dipstick: NEGATIVE
Ketones, ur: NEGATIVE mg/dL
Leukocytes,Ua: NEGATIVE
Nitrite: NEGATIVE
Protein, ur: NEGATIVE mg/dL
Specific Gravity, Urine: >1.03 — ABNORMAL HIGH (ref 1.005–1.030)
pH: 5.5 (ref 5.0–8.0)

## 2019-08-08 IMAGING — CR DG SHOULDER 2+V*R*
3 series · 3 of 3 positions shown · non-contrast
Comparison: None.

CLINICAL DATA: Pain and discomfort

EXAM:
RIGHT SHOULDER - 2+ VIEW

[w shoulder grashey right *]
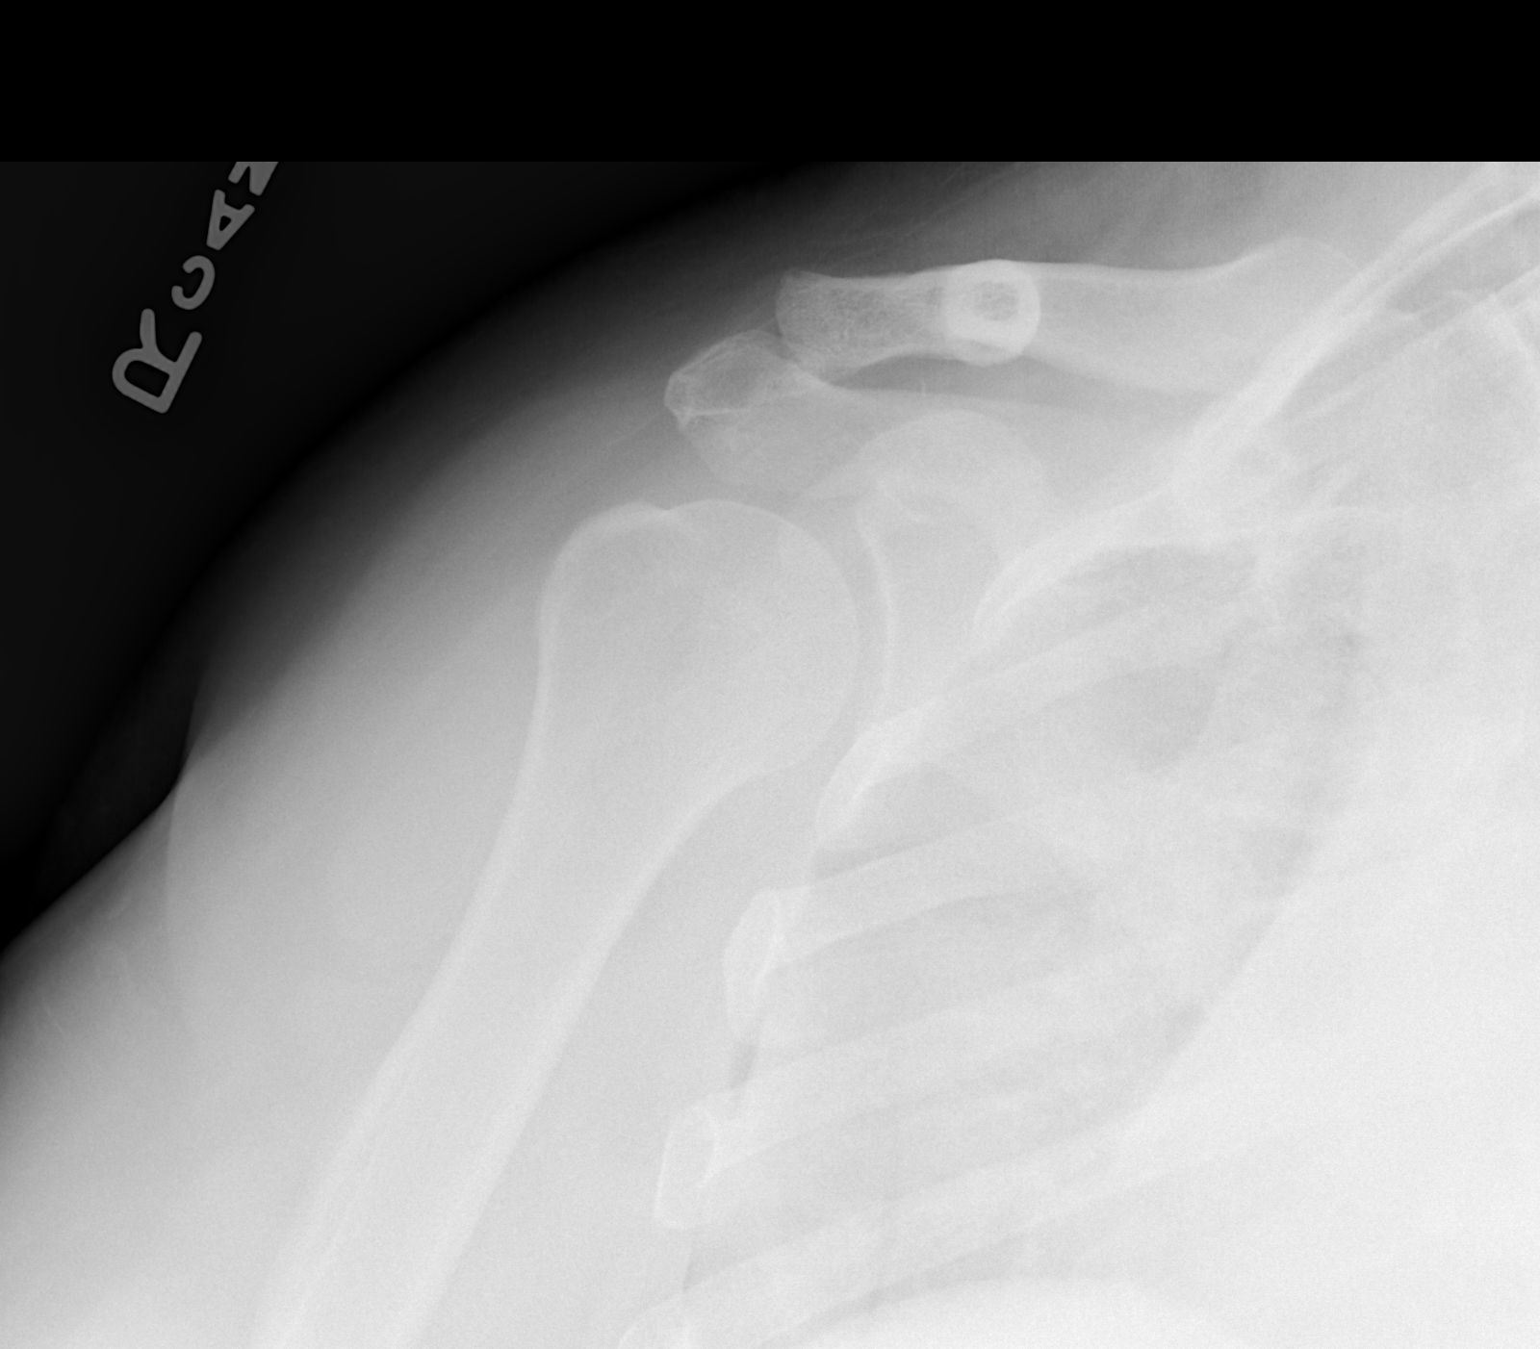

[w shoulder y view right *]
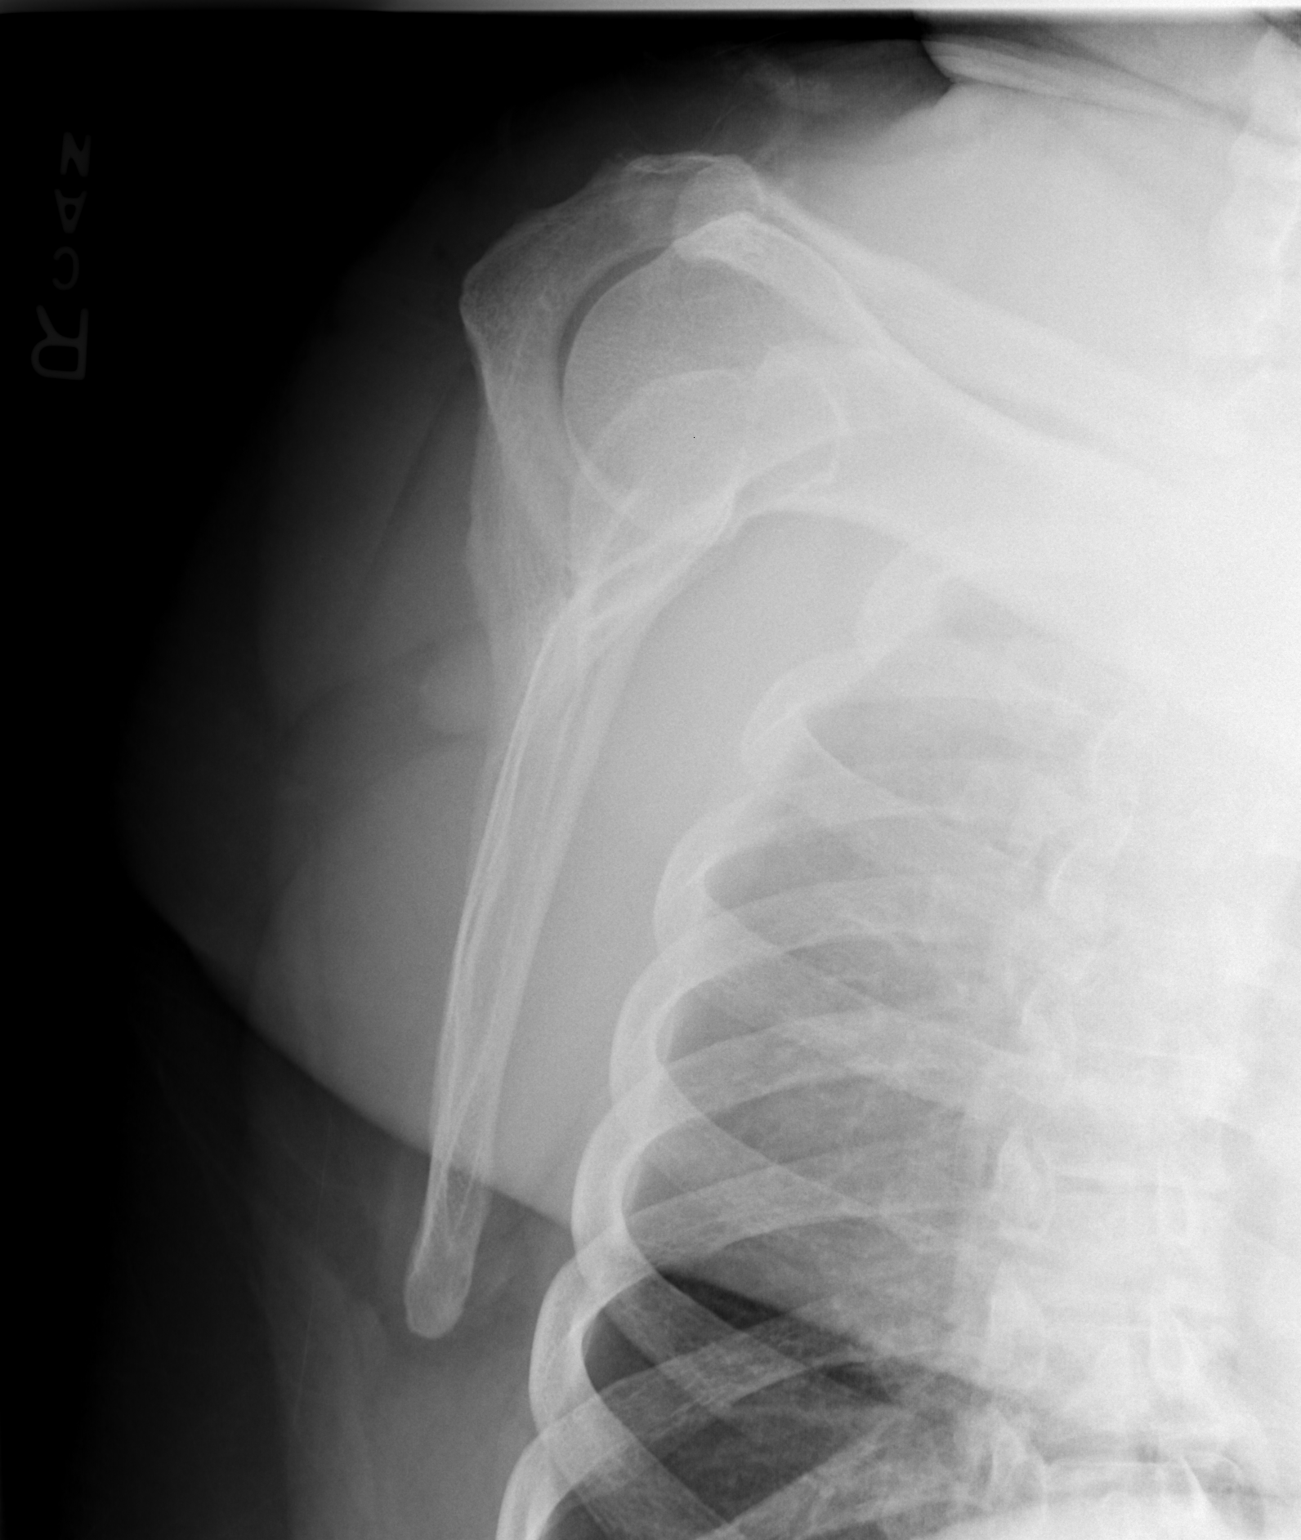

[x shoulder axillary right *]
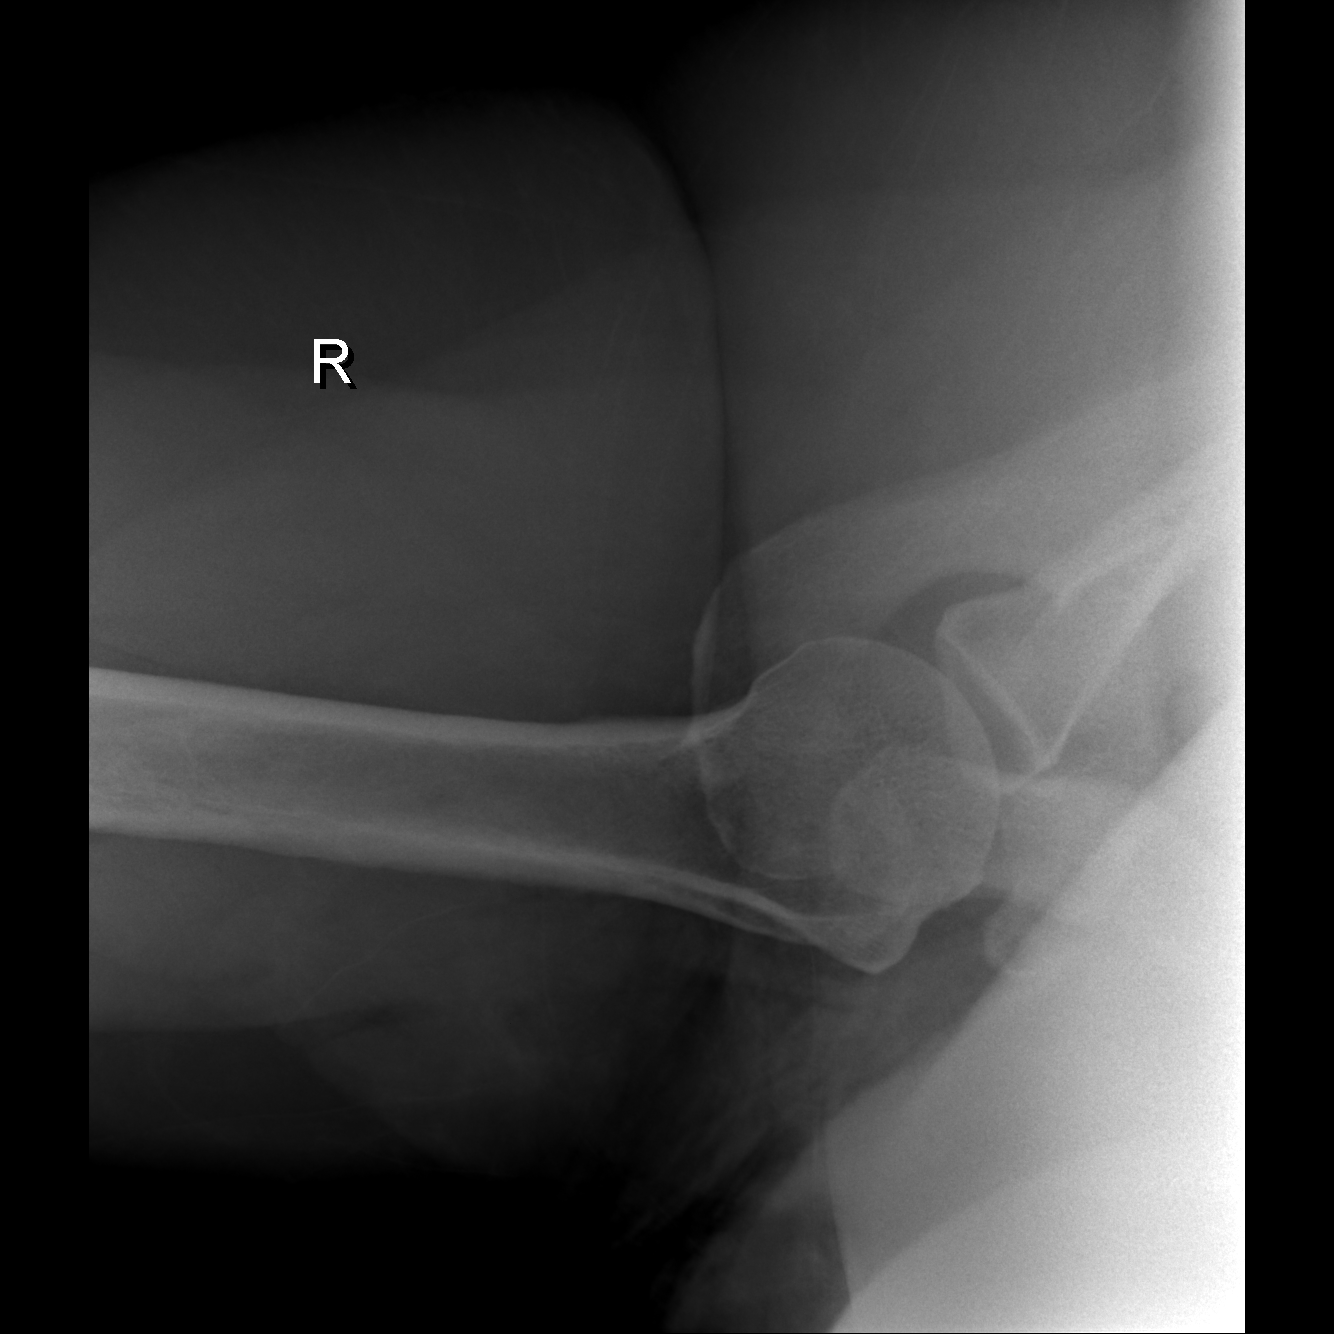

[3 of 3 positions shown; findings below may reference images not displayed]

FINDINGS: Somewhat limited due to technique. No definite fracture or
dislocation. Normal bone mineralization seen throughout.
IMPRESSION: Negative.

## 2019-08-08 IMAGING — CR DG CHEST 2V
2 series · 2 of 2 positions shown · non-contrast
Comparison: [DATE]

CLINICAL DATA: Pain, right chest wall pain

EXAM:
CHEST - 2 VIEW

[w chest pa]
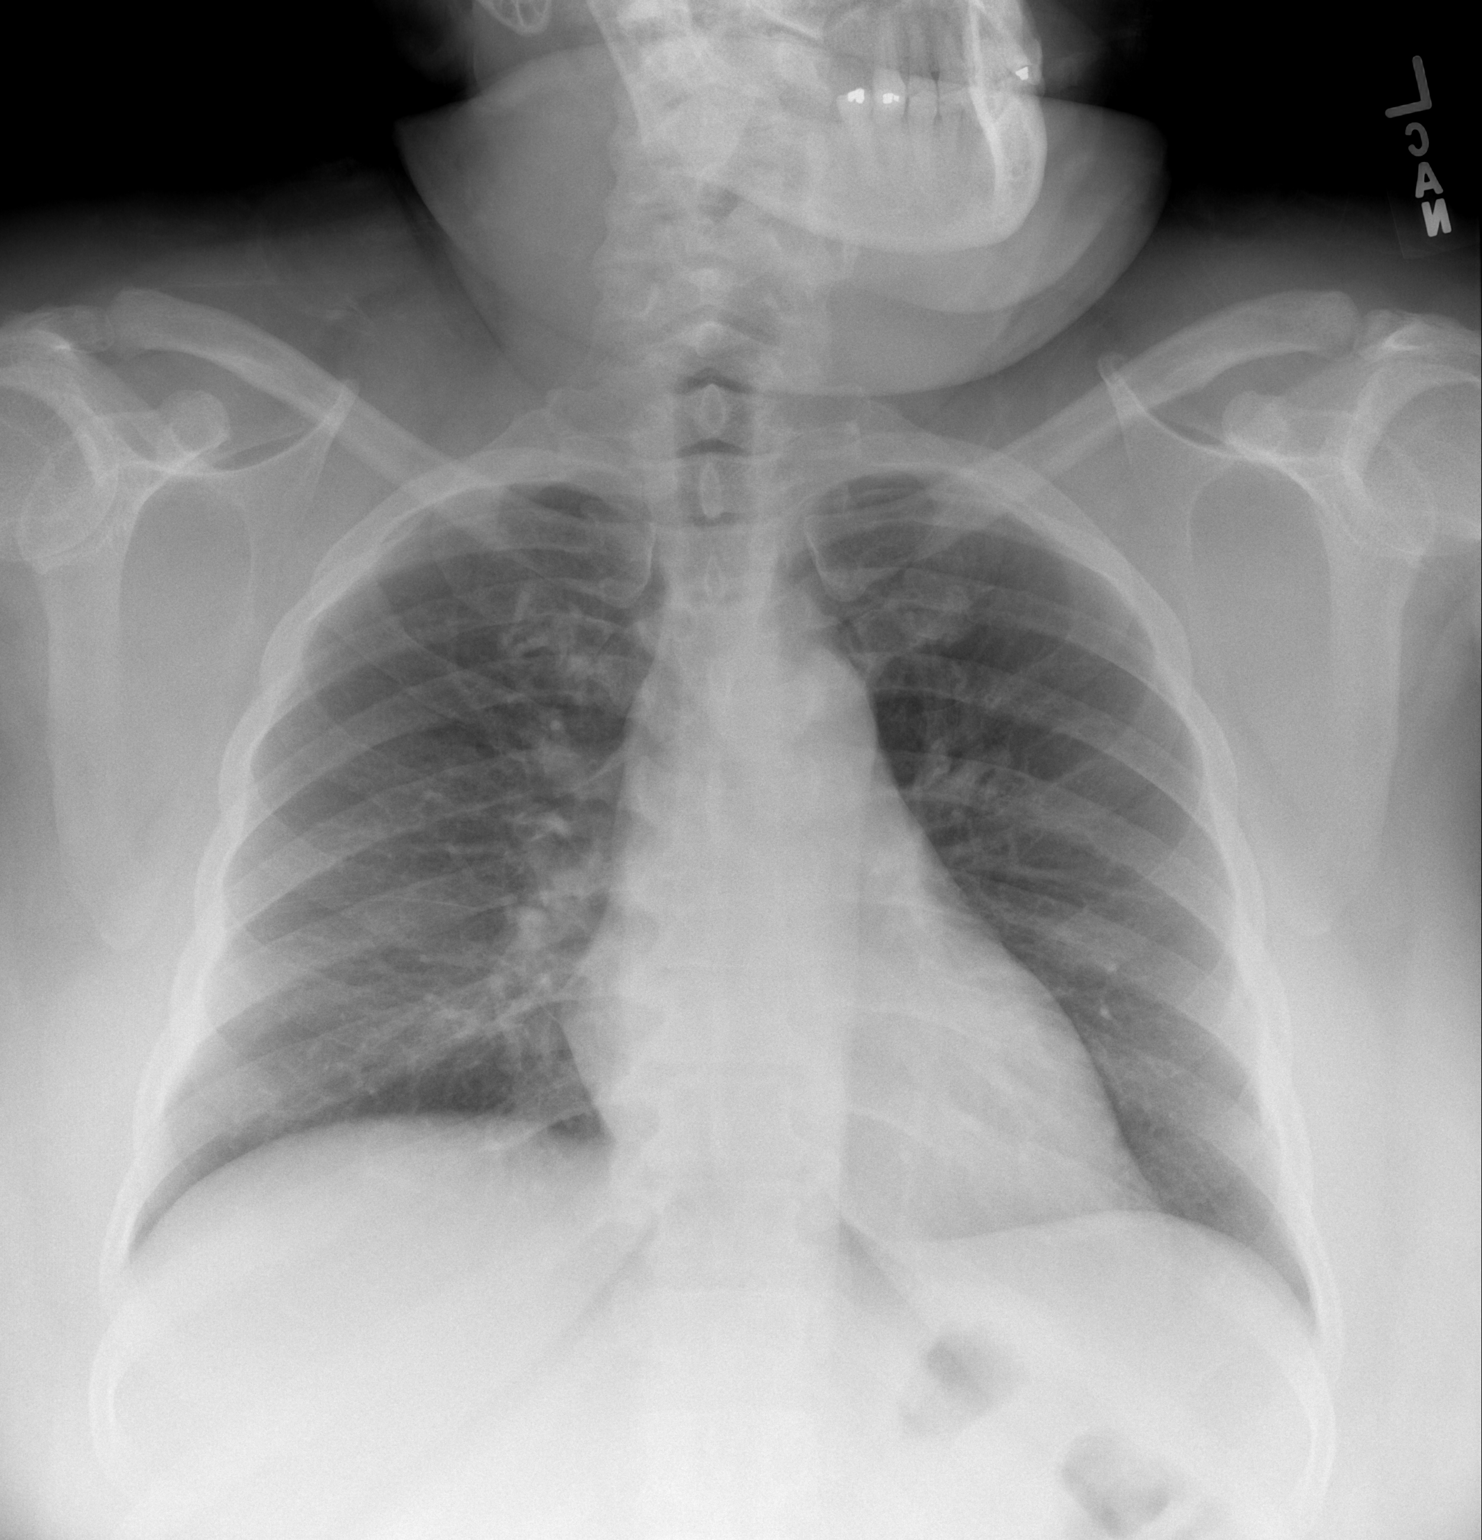

[w chest lat]
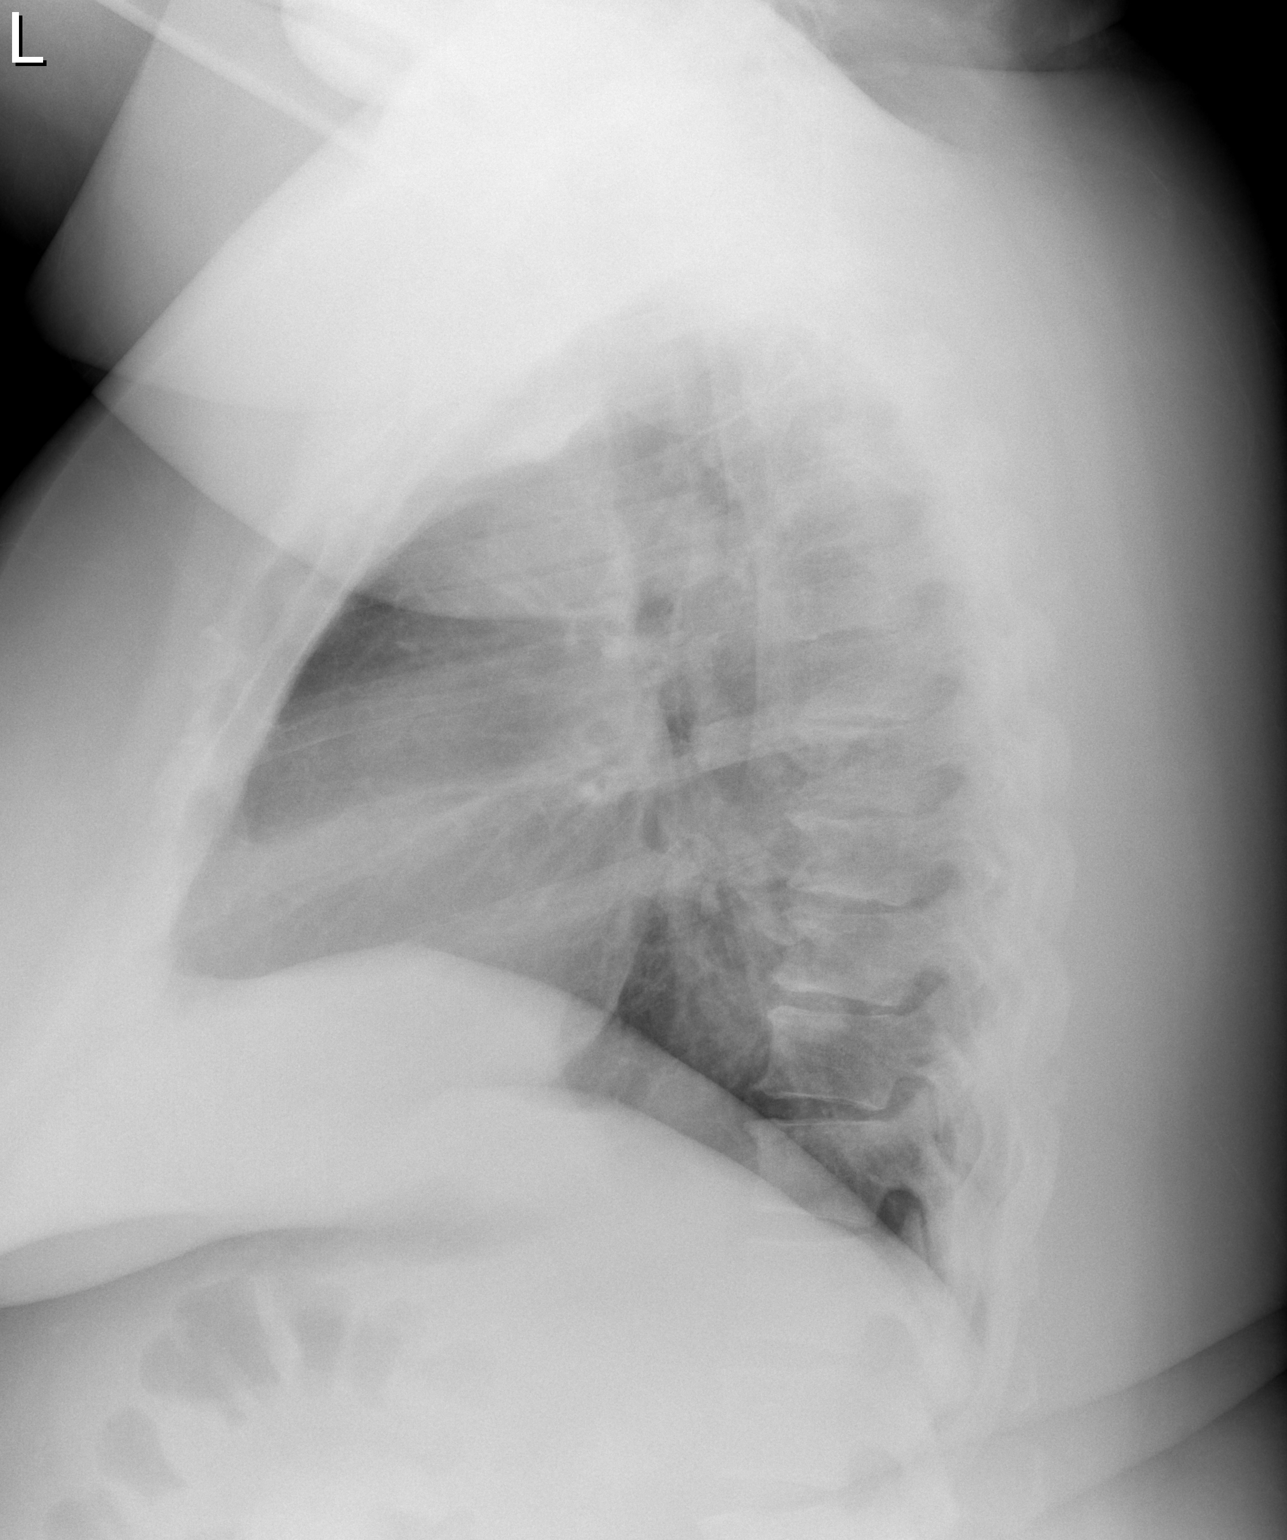

[2 of 2 positions shown; findings below may reference images not displayed]

FINDINGS: The heart size and mediastinal contours are within normal limits.
Both lungs are clear. The visualized skeletal structures are
unremarkable.
IMPRESSION: No active cardiopulmonary disease.

## 2019-08-08 MED ORDER — METHOCARBAMOL 500 MG PO TABS: 1000.0000 mg | ORAL_TABLET | Freq: Three times a day (TID) | ORAL | 0 refills | Status: DC | PRN

## 2019-08-08 MED ORDER — NAPROXEN 500 MG PO TABS: 500.0000 mg | ORAL_TABLET | Freq: Two times a day (BID) | ORAL | 0 refills | Status: DC | PRN

## 2019-08-08 NOTE — Discharge Instructions (Signed)
It was my pleasure taking care of you today!   Follow-up with your primary care doctor if you are not feeling better by Monday.  Return to ER for new or worsening symptoms, any additional concerns.

## 2019-08-08 NOTE — ED Triage Notes (Signed)
pt c/o right chest wall pain and discomfort with movt x 1 week

## 2019-08-08 NOTE — ED Provider Notes (Signed)
[facility] EMERGENCY DEPARTMENT Provider Note   CSN: [id number] Arrival date & time: 08/08/19  1602     History   Chief Complaint Chief Complaint  Patient presents with  . Chest wall pain    HPI Dana Patterson is a 43 y.o. female.     The history is provided by the patient and medical records. No language interpreter was used.   Dana Patterson is a 43 y.o. female with a PMH of HTN, HLD who presents to the Emergency Department complaining of right shoulder pain which intermittently radiates towards chest wall and down right side of her back.  Started 2 days ago.  Worse with certain movements, especially lifting her hand above her head.  No known inciting event or trauma.  No numbness or weakness.  Denies chest pain or shortness of breath.  No urinary symptoms.  No cough, congestion or fever, but does state that she has felt herself wheezing over the last day or 2.  Has tried a muscle relaxer medicine with some improvement.  Denies history of similar.  No bowel or bladder incontinence or saddle anesthesia.   Past Medical History:  Diagnosis Date  . Allergy   . HTN (hypertension) 10/29/2015  . Hyperlipidemia   . Vaginal Pap smear, abnormal     Patient Active Problem List   Diagnosis Date Noted  . Morbid obesity (HCC) 01/14/2016  . Hypokalemia 01/14/2016  . Obstructive sleep apnea 01/14/2016  . Palpitations 01/14/2016  . Metabolic syndrome 01/14/2016  . Left hip pain 12/09/2015  . HTN (hypertension) 10/29/2015  . Allergic rhinitis 10/29/2015  . Pain in joint, shoulder region 10/29/2015  . Neck pain 10/29/2015    Past Surgical History:  Procedure Laterality Date  . ABDOMINAL HYSTERECTOMY     Fibroids per patient  . TUBAL LIGATION       OB History    Gravida  6   Para  4   Term  4   Preterm      AB  2   Living        SAB  2   TAB      Ectopic      Multiple      Live Births  4        Obstetric Comments  2 pregnancies after Tubal  Lig, both miscarriages per patient         Home Medications    Prior to Admission medications   Medication Sig Start Date End Date Taking? Authorizing Provider  ciprofloxacin (CIPRO) 250 MG tablet Take 1 tablet (250 mg total) by mouth 2 (two) times daily. 05/09/19   Seabron Spates R, DO  fluticasone (FLONASE) 50 MCG/ACT nasal spray Place 2 sprays into both nostrils daily. 08/23/17   Sharlene Dory, DO  hydrochlorothiazide (HYDRODIURIL) 25 MG tablet TAKE 1 TABLET BY MOUTH EVERY DAY 07/01/19   Lowne Chase, Yvonne R, DO  KLOR-CON M20 20 MEQ tablet TAKE 2 TABLETS (40 MEQ TOTAL) BY MOUTH DAILY. 02/14/19   Donato Schultz, DO  loratadine (CLARITIN) 10 MG tablet TAKE 1 TABLET BY MOUTH EVERY DAY 06/18/19   Zola Button, Grayling Congress, DO  methocarbamol (ROBAXIN) 500 MG tablet Take 2 tablets (1,000 mg total) by mouth every 8 (eight) hours as needed (muscle pain). 08/08/19   , Chase Picket, PA-C  naproxen (NAPROSYN) 500 MG tablet Take 1 tablet (500 mg total) by mouth 2 (two) times daily as needed. 08/08/19   , Chase Picket, PA-C  sulfamethoxazole-trimethoprim (BACTRIM DS,SEPTRA DS) 800-160 MG tablet Take 1 tablet by mouth 2 (two) times daily. 08/19/18   Ann Held, DO    Family History Family History  Problem Relation Age of Onset  . Heart disease Mother   . Kidney disease Mother   . Hypertension Mother   . Hypertension Father     Social History Social History   Tobacco Use  . Smoking status: Never Smoker  . Smokeless tobacco: Never Used  Substance Use Topics  . Alcohol use: No    Alcohol/week: 0.0 standard drinks  . Drug use: No     Allergies   Patient has no known allergies.   Review of Systems Review of Systems  Respiratory: Positive for wheezing. Negative for cough and shortness of breath.   Musculoskeletal: Positive for arthralgias and myalgias.  All other systems reviewed and are negative.    Physical Exam Updated Vital Signs BP 117/72  (BP Location: Right Arm)   Pulse 83   Temp 98.4 F (36.9 C)   Resp 20   Ht 5\' 2"  (1.575 m)   Wt 106.6 kg   SpO2 98%   BMI 42.98 kg/m   Physical Exam Vitals signs and nursing note reviewed.  Constitutional:      General: She is not in acute distress.    Appearance: She is well-developed.  HENT:     Head: Normocephalic and atraumatic.  Neck:     Musculoskeletal: Neck supple.  Cardiovascular:     Rate and Rhythm: Normal rate and regular rhythm.     Heart sounds: Normal heart sounds. No murmur.  Pulmonary:     Effort: Pulmonary effort is normal. No respiratory distress.     Breath sounds: Normal breath sounds.     Comments: Lungs clear to auscultation bilaterally. Abdominal:     General: There is no distension.     Palpations: Abdomen is soft.     Comments: No abdominal, flank or CVA tenderness.  Musculoskeletal:        General: No swelling or tenderness.       Arms:     Right lower leg: No edema.     Left lower leg: No edema.     Comments: No midline C/T/L-spine tenderness.  Tenderness to palpation as depicted in image.  Full range of motion and 5/5 muscle strength including grip strength.  Negative Neer's and empty can.  2+ radial pulse and sensation intact.  Skin:    General: Skin is warm and dry.  Neurological:     Mental Status: She is alert and oriented to person, place, and time.      ED Treatments / Results  Labs (all labs ordered are listed, but only abnormal results are displayed) Labs Reviewed  URINALYSIS, ROUTINE W REFLEX MICROSCOPIC - Abnormal; Notable for the following components:      Result Value   Specific Gravity, Urine >1.030 (*)    All other components within normal limits    EKG EKG Interpretation  Date/Time:  Thursday August 08 2019 17:00:38 EDT Ventricular Rate:  85 PR Interval:    QRS Duration: 80 QT Interval:  386 QTC Calculation: 459 R Axis:   37 Text Interpretation:  Sinus rhythm Low voltage, precordial leads Borderline T  abnormalities, inferior leads TWI in lead III improved when compared to prior on 11/22/18 Confirmed by Quintella Reichert [phone]) on 08/08/2019 5:05:12 PM   Radiology Dg Chest 2 View  Result Date: 08/08/2019 CLINICAL DATA:  Pain, right chest  wall pain EXAM: CHEST - 2 VIEW COMPARISON:  November 22, 2018 FINDINGS: The heart size and mediastinal contours are within normal limits. Both lungs are clear. The visualized skeletal structures are unremarkable. IMPRESSION: No active cardiopulmonary disease. Electronically Signed   By: Jonna ClarkBindu  Avutu M.D.   On: 08/08/2019 17:01   Dg Shoulder Right  Result Date: 08/08/2019 CLINICAL DATA:  Pain and discomfort EXAM: RIGHT SHOULDER - 2+ VIEW COMPARISON:  None. FINDINGS: Somewhat limited due to technique. No definite fracture or dislocation. Normal bone mineralization seen throughout. IMPRESSION: Negative. Electronically Signed   By: Jonna ClarkBindu  Avutu M.D.   On: 08/08/2019 17:01    Procedures Procedures (including critical care time)  Medications Ordered in ED Medications - No data to display   Initial Impression / Assessment and Plan / ED Course  I have reviewed the triage vital signs and the nursing notes.  Pertinent labs & imaging results that were available during my care of the patient were reviewed by me and considered in my medical decision making (see chart for details).       Dana KitchensStephanie Patterson is a 43 y.o. female who presents to ED for right shoulder pain which intermittently radiates towards chest wall and down to back.  Worse with certain movements.  Exam consistent with MSK etiology. Neurovascularly intact on exam as well.  EKG was obtained with no acute ischemic changes.  Chest x-ray and plain film of the shoulder negative.  Evaluation does not show pathology that would require ongoing emergent intervention or inpatient treatment.  Will treat symptomatically and have her follow-up with PCP.  Reasons to return to the ER were discussed and all questions were  answered.   Final Clinical Impressions(s) / ED Diagnoses   Final diagnoses:  Acute pain of left shoulder    ED Discharge Orders         Ordered    methocarbamol (ROBAXIN) 500 MG tablet  Every 8 hours PRN     08/08/19 1729    naproxen (NAPROSYN) 500 MG tablet  2 times daily PRN     08/08/19 1729           , Chase PicketJaime Pilcher, PA-C 08/08/19 1804    Tilden Fossaees, Elizabeth, MD 08/08/19 2356

## 2019-08-08 NOTE — ED Notes (Signed)
Patient transported to X-ray 

## 2019-08-14 ENCOUNTER — Other Ambulatory Visit: Payer: Self-pay

## 2019-08-15 ENCOUNTER — Ambulatory Visit (INDEPENDENT_AMBULATORY_CARE_PROVIDER_SITE_OTHER): Payer: Self-pay | Admitting: Family Medicine

## 2019-08-15 ENCOUNTER — Other Ambulatory Visit: Payer: Self-pay | Admitting: Family Medicine

## 2019-08-15 ENCOUNTER — Ambulatory Visit (HOSPITAL_BASED_OUTPATIENT_CLINIC_OR_DEPARTMENT_OTHER)
Admission: RE | Admit: 2019-08-15 | Discharge: 2019-08-15 | Disposition: A | Discharge status: Home / Self Care | Payer: Self-pay | Source: Ambulatory Visit | Attending: Family Medicine | Admitting: Family Medicine

## 2019-08-15 ENCOUNTER — Encounter: Payer: Self-pay | Admitting: Family Medicine

## 2019-08-15 VITALS — BP 124/90 | HR 108 | Temp 97.9°F | Resp 18 | Ht 62.0 in | Wt 247.2 lb

## 2019-08-15 DIAGNOSIS — G8929 Other chronic pain: Secondary | ICD-10-CM | POA: Insufficient documentation

## 2019-08-15 DIAGNOSIS — M546 Pain in thoracic spine: Secondary | ICD-10-CM | POA: Insufficient documentation

## 2019-08-15 DIAGNOSIS — M545 Low back pain, unspecified: Secondary | ICD-10-CM

## 2019-08-15 DIAGNOSIS — R350 Frequency of micturition: Secondary | ICD-10-CM

## 2019-08-15 LAB — POCT URINALYSIS DIPSTICK OB
Bilirubin, UA: NEGATIVE
Blood, UA: NEGATIVE
Glucose, UA: NEGATIVE
Ketones, UA: NEGATIVE
Leukocytes, UA: NEGATIVE
Nitrite, UA: NEGATIVE
POC,PROTEIN,UA: NEGATIVE
Spec Grav, UA: 1.015 (ref 1.010–1.025)
Urobilinogen, UA: 0.2 E.U./dL
pH, UA: 5 (ref 5.0–8.0)

## 2019-08-15 IMAGING — DX DG THORACIC SPINE 2V
3 series · 3 of 3 positions shown · non-contrast
Comparison: None.

CLINICAL DATA: Back pain, no trauma

EXAM:
THORACIC SPINE 2 VIEWS; LUMBAR SPINE - COMPLETE 4+ VIEW

[t-spine ap]
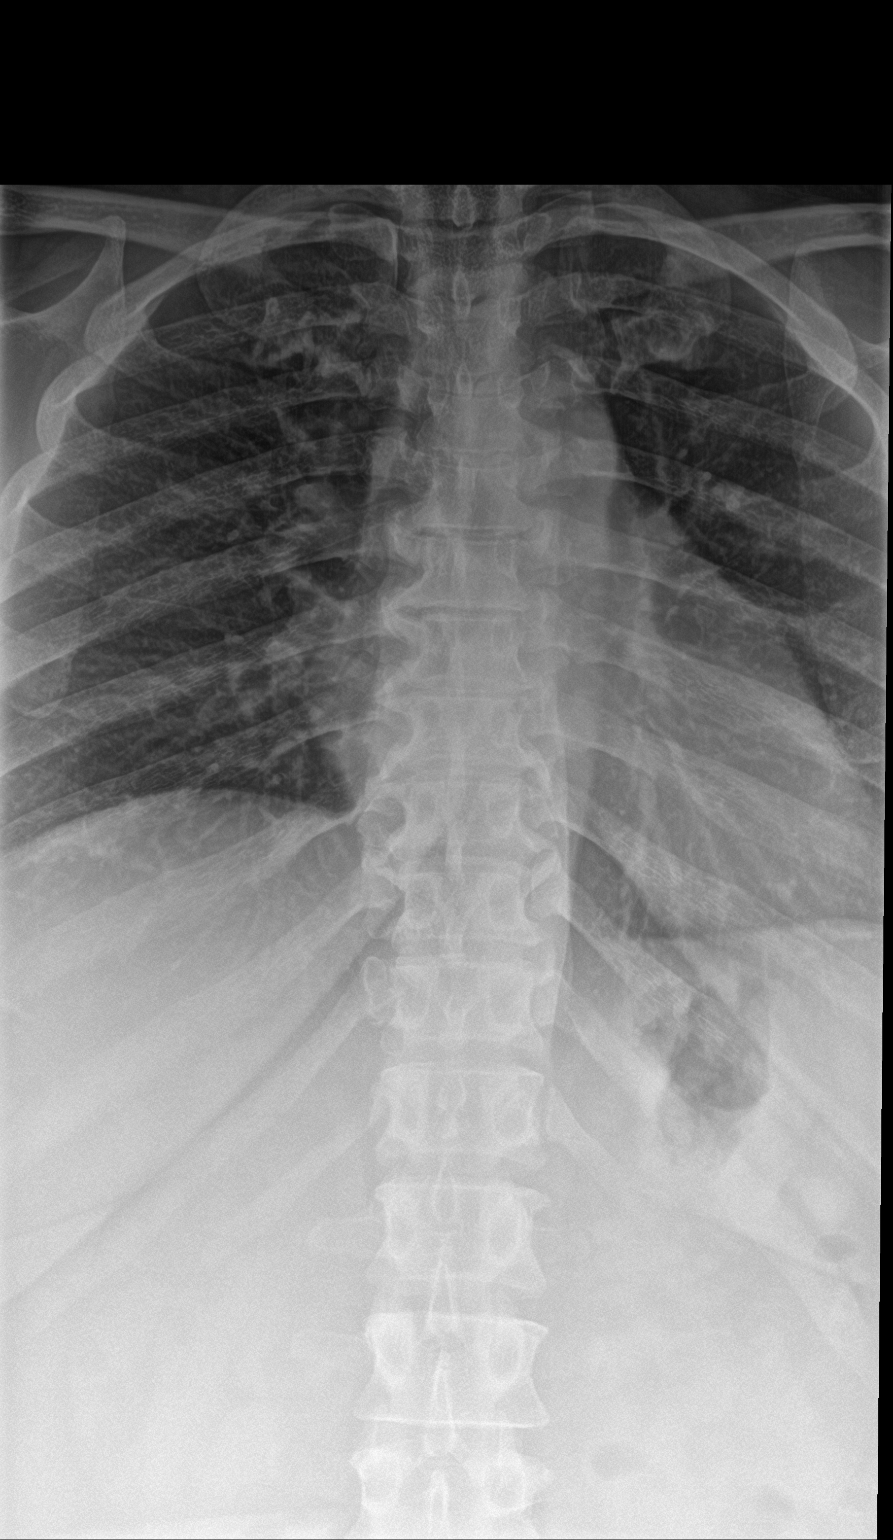

[t-spine lat]
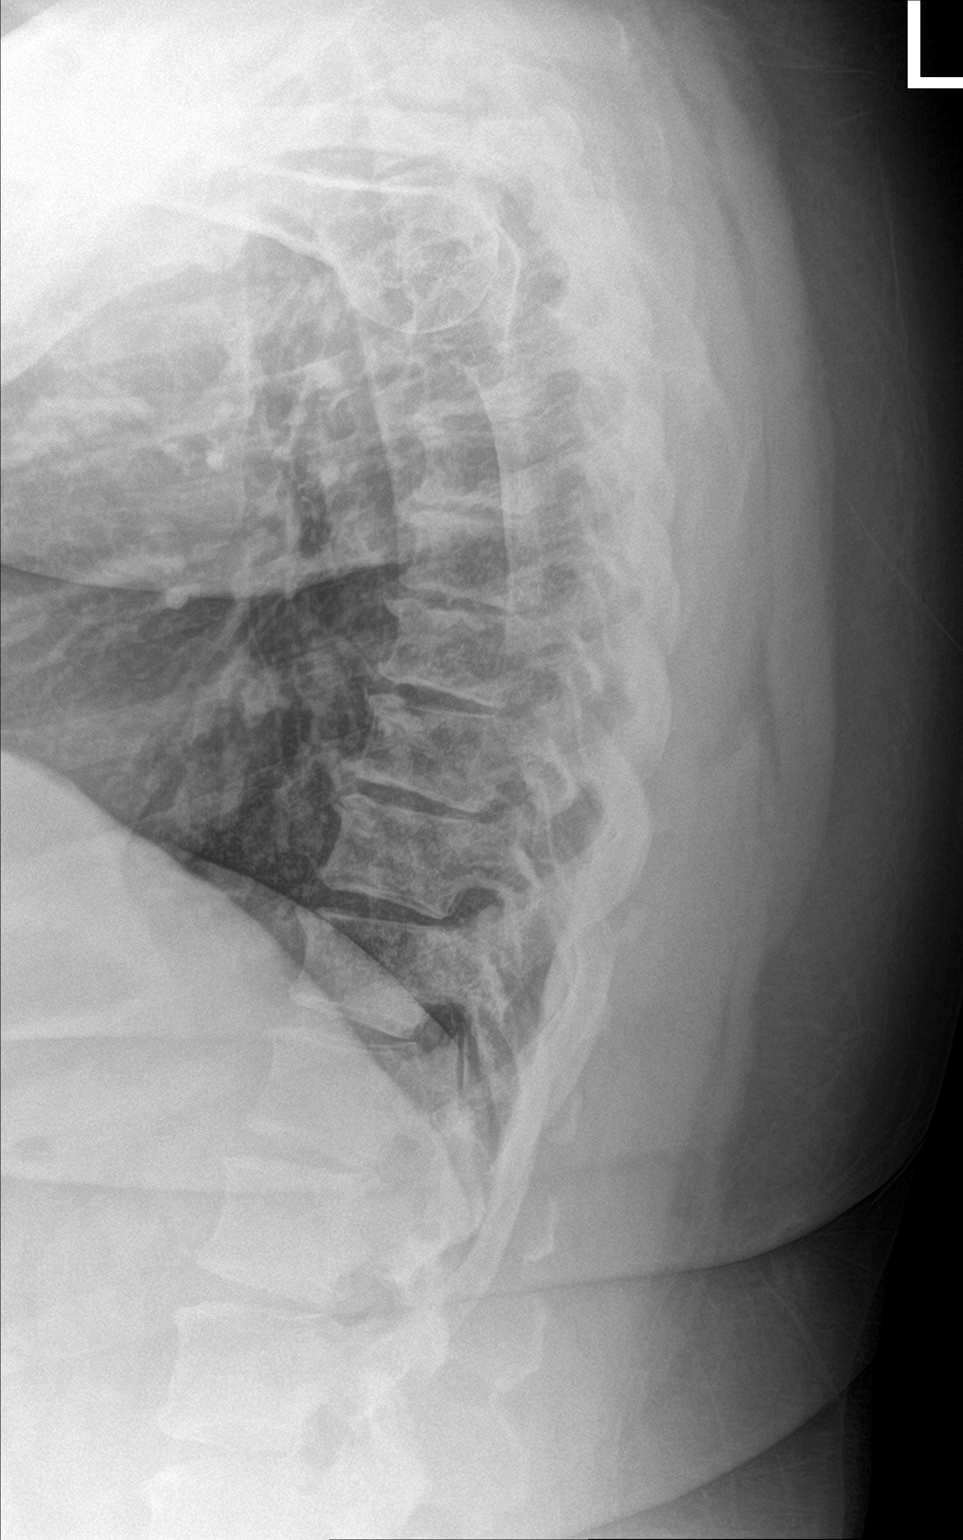

[t-spine swimmers]
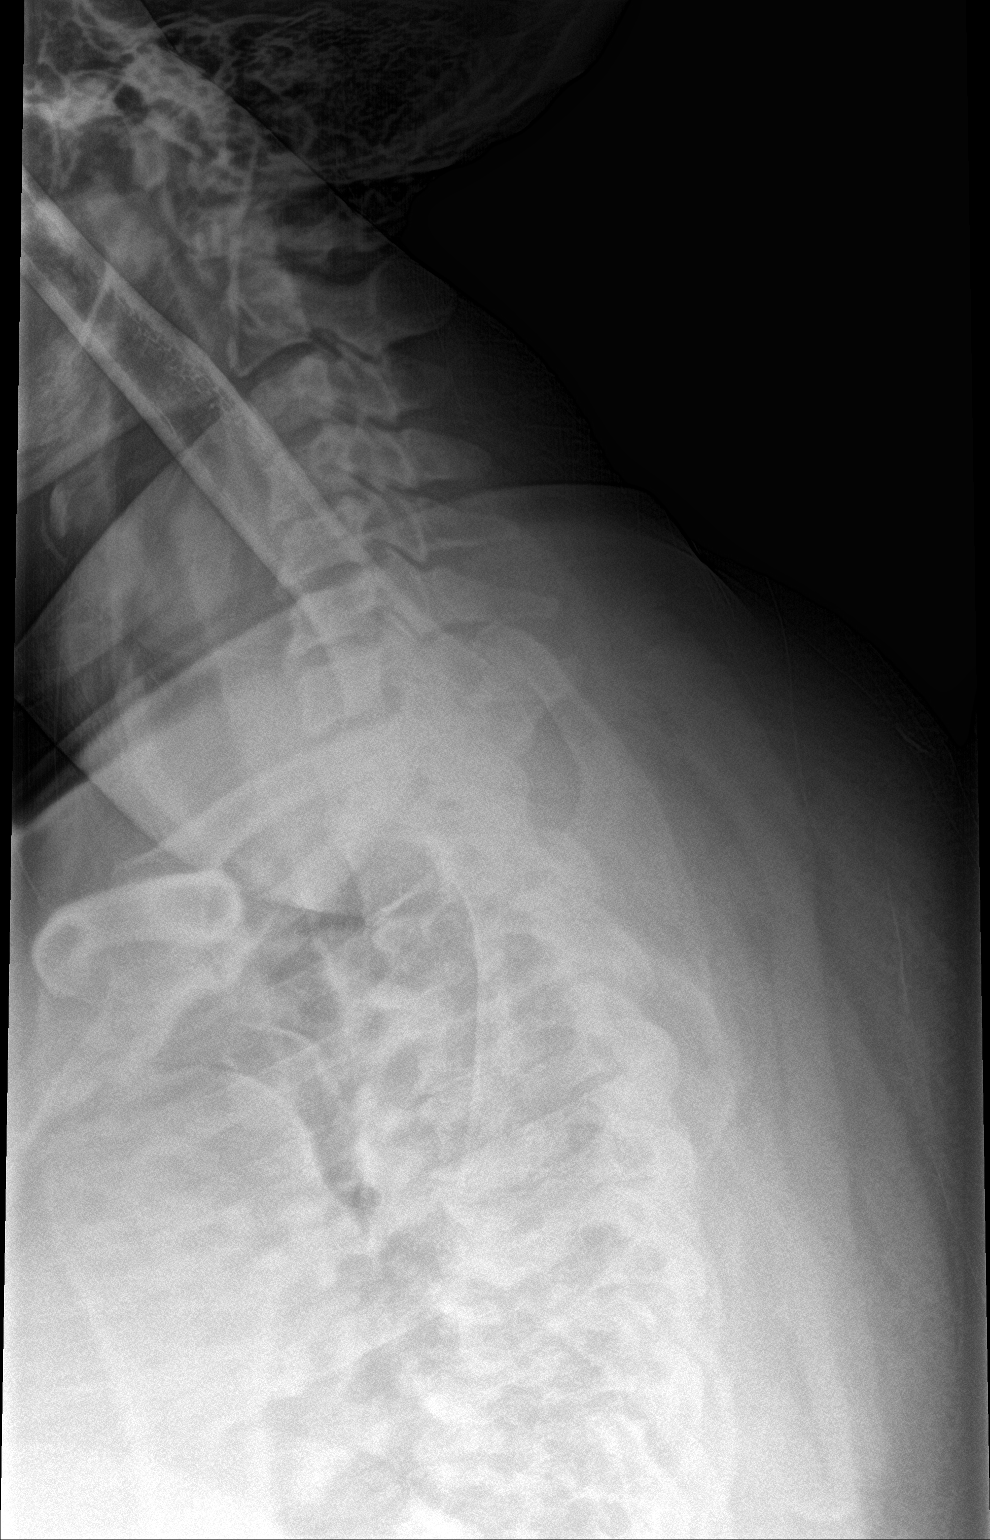

[3 of 3 positions shown; findings below may reference images not displayed]

FINDINGS: No fracture or dislocation of the thoracic spine. Vertebral body
heights are preserved. There is mild multilevel disc space height
loss and osteophytosis throughout the thoracic spine. The partially
imaged chest is unremarkable.

No fracture or dislocation of the lumbar spine. Vertebral body
heights are preserved. There is minimal disc space height loss and
osteophytosis. Facets are intact. Unremarkable pattern of overlying
bowel gas.
IMPRESSION: 1. No fracture or dislocation of the thoracic spine. Vertebral body
heights are preserved. There is mild multilevel disc space height
loss and osteophytosis throughout the thoracic spine.

2. No fracture or dislocation of the lumbar spine. Vertebral body
heights are preserved. There is minimal disc space height loss and
osteophytosis.

## 2019-08-15 IMAGING — DX DG LUMBAR SPINE COMPLETE 4+V
5 series · 5 of 5 positions shown · non-contrast
Comparison: None.

CLINICAL DATA: Back pain, no trauma

EXAM:
THORACIC SPINE 2 VIEWS; LUMBAR SPINE - COMPLETE 4+ VIEW

[l-spine ap]
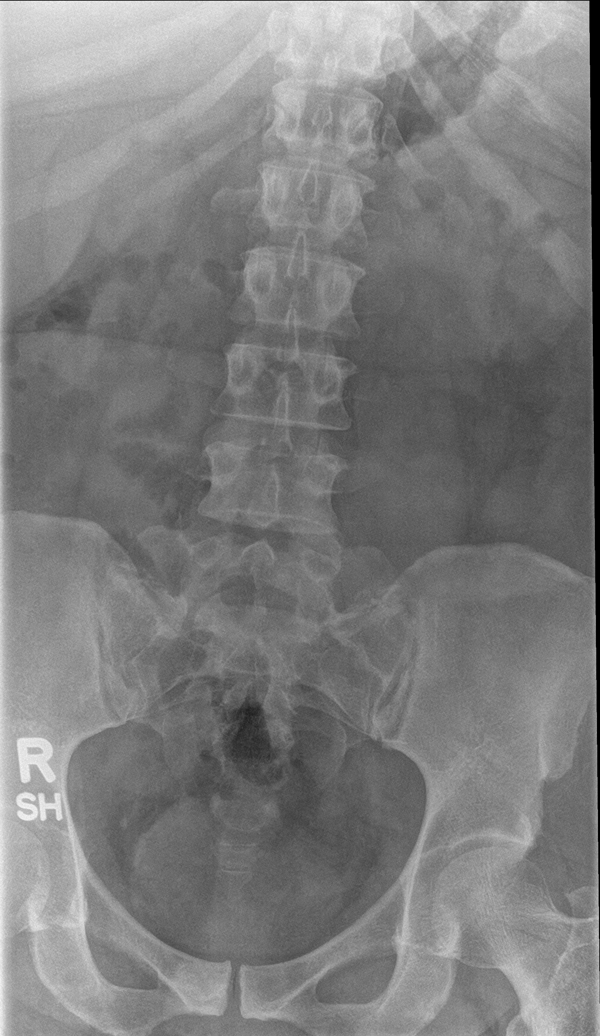

[l-spine obl (1 of 2)]
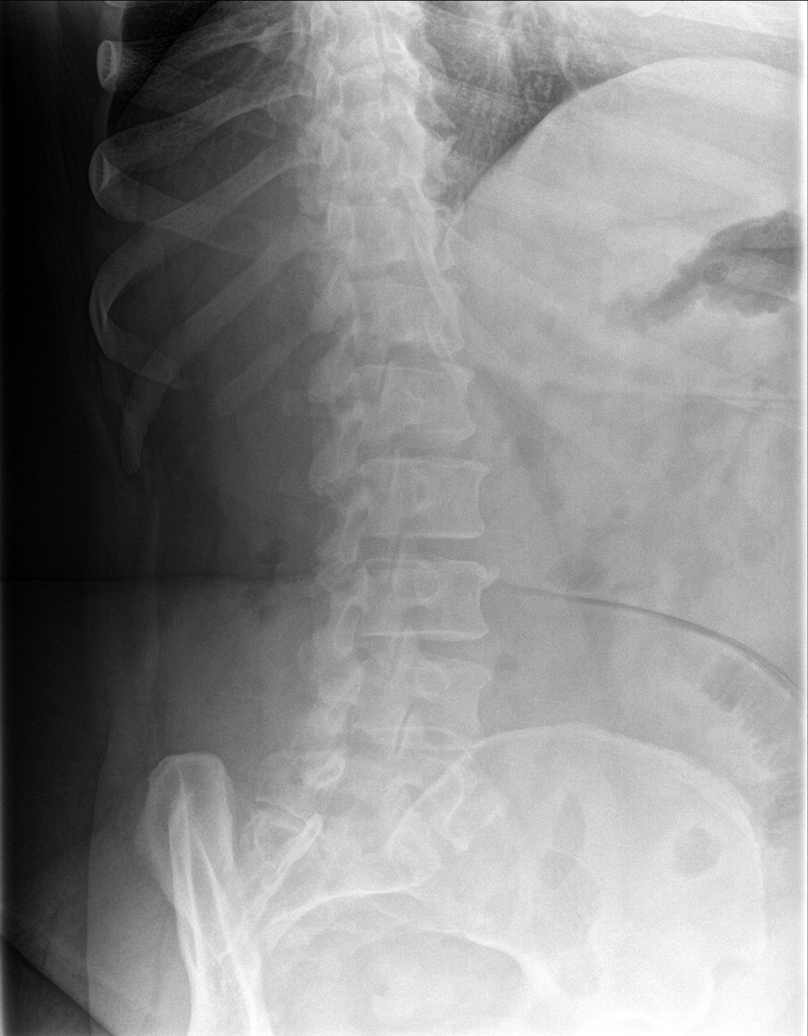

[l-spine obl (2 of 2)]
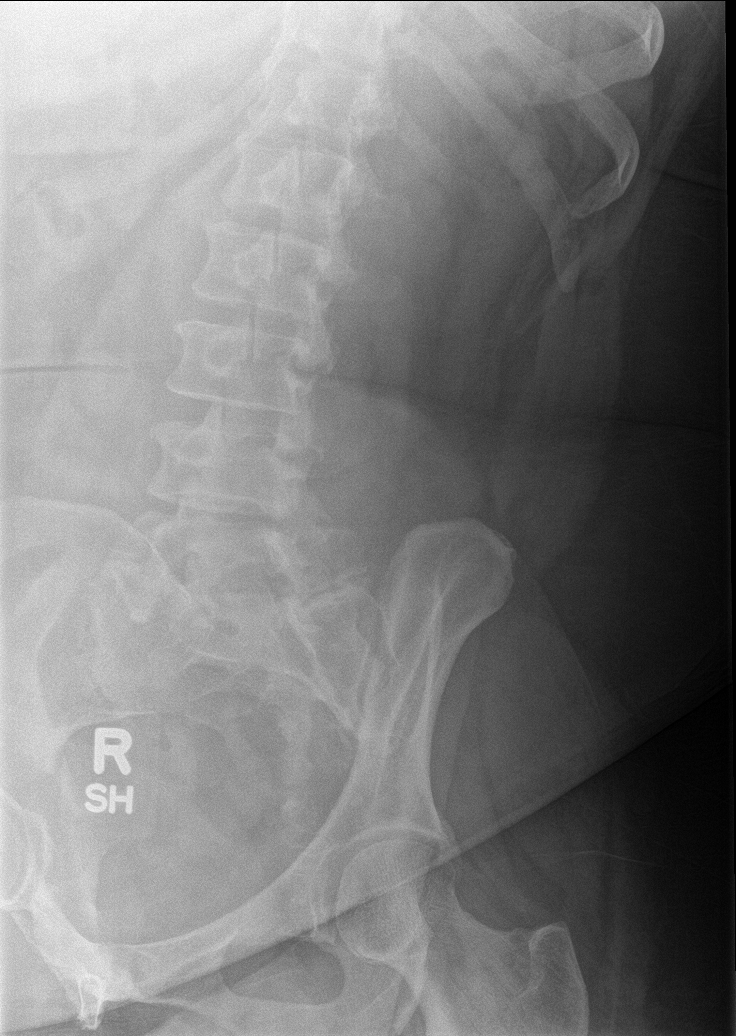

[l-spine lat]
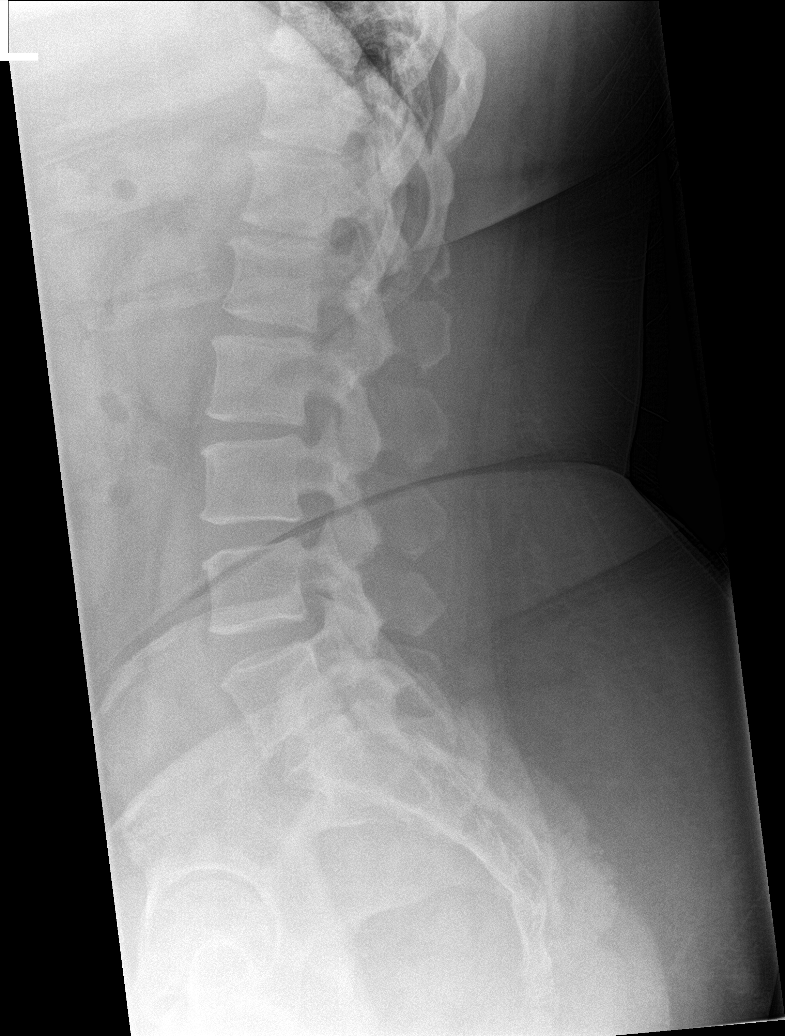

[l-spine spot]
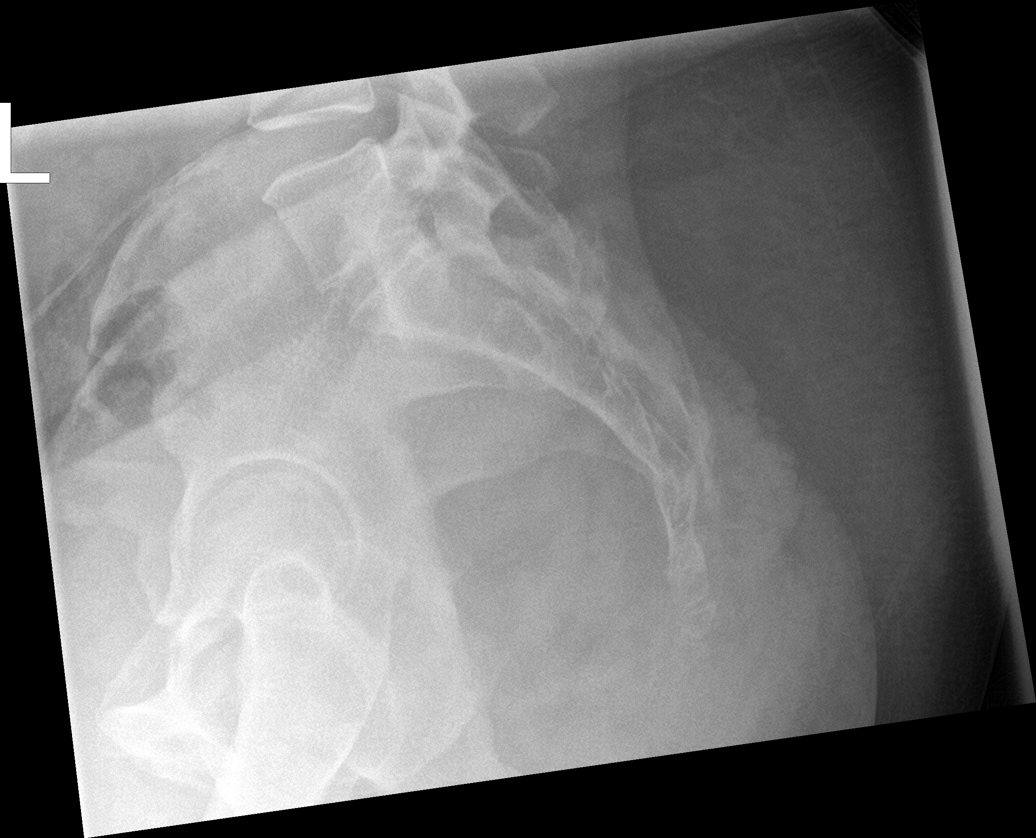

[5 of 5 positions shown; findings below may reference images not displayed]

FINDINGS: No fracture or dislocation of the thoracic spine. Vertebral body
heights are preserved. There is mild multilevel disc space height
loss and osteophytosis throughout the thoracic spine. The partially
imaged chest is unremarkable.

No fracture or dislocation of the lumbar spine. Vertebral body
heights are preserved. There is minimal disc space height loss and
osteophytosis. Facets are intact. Unremarkable pattern of overlying
bowel gas.
IMPRESSION: 1. No fracture or dislocation of the thoracic spine. Vertebral body
heights are preserved. There is mild multilevel disc space height
loss and osteophytosis throughout the thoracic spine.

2. No fracture or dislocation of the lumbar spine. Vertebral body
heights are preserved. There is minimal disc space height loss and
osteophytosis.

## 2019-08-15 MED ORDER — MELOXICAM 15 MG PO TABS: 15.0000 mg | ORAL_TABLET | Freq: Every day | ORAL | 0 refills | Status: DC

## 2019-08-15 MED ORDER — CYCLOBENZAPRINE HCL 10 MG PO TABS: 10.0000 mg | ORAL_TABLET | Freq: Three times a day (TID) | ORAL | 0 refills | Status: DC | PRN

## 2019-08-15 NOTE — Patient Instructions (Signed)

## 2019-08-15 NOTE — Progress Notes (Signed)
Patient ID: Dana Patterson, female    DOB: [birth year]  Age: 43 y.o. MRN: [id number]    Subjective:  Subjective  HPI Dana Patterson presents for help with weight loss and c/o low back pain and mid back pain .   No known injury --  Symptoms for several weeks with no improvement  Pt is also trying to lose weight and is struggling with it   Review of Systems  Constitutional: Negative for appetite change, diaphoresis, fatigue and unexpected weight change.  Eyes: Negative for pain, redness and visual disturbance.  Respiratory: Negative for cough, chest tightness, shortness of breath and wheezing.   Cardiovascular: Negative for chest pain, palpitations and leg swelling.  Endocrine: Negative for cold intolerance, heat intolerance, polydipsia, polyphagia and polyuria.  Genitourinary: Negative for difficulty urinating, dysuria and frequency.  Musculoskeletal: Positive for arthralgias and back pain.  Neurological: Negative for dizziness, light-headedness, numbness and headaches.    History Past Medical History:  Diagnosis Date  . Allergy   . HTN (hypertension) 10/29/2015  . Hyperlipidemia   . Vaginal Pap smear, abnormal     She has a past surgical history that includes Abdominal hysterectomy and Tubal ligation.   Her family history includes Heart disease in her mother; Hypertension in her father and mother; Kidney disease in her mother.She reports that she has never smoked. She has never used smokeless tobacco. She reports that she does not drink alcohol or use drugs.  Current Outpatient Medications on File Prior to Visit  Medication Sig Dispense Refill  . hydrochlorothiazide (HYDRODIURIL) 25 MG tablet TAKE 1 TABLET BY MOUTH EVERY DAY 30 tablet 1  . KLOR-CON M20 20 MEQ tablet TAKE 2 TABLETS (40 MEQ TOTAL) BY MOUTH DAILY. 60 tablet 2  . loratadine (CLARITIN) 10 MG tablet TAKE 1 TABLET BY MOUTH EVERY DAY 30 tablet 4  . methocarbamol (ROBAXIN) 500 MG tablet Take 2 tablets (1,000 mg total) by  mouth every 8 (eight) hours as needed (muscle pain). 20 tablet 0  . naproxen (NAPROSYN) 500 MG tablet Take 1 tablet (500 mg total) by mouth 2 (two) times daily as needed. 30 tablet 0   No current facility-administered medications on file prior to visit.      Objective:  Objective  Physical Exam Vitals signs and nursing note reviewed.  Constitutional:      Appearance: She is well-developed.  HENT:     Head: Normocephalic and atraumatic.  Eyes:     Conjunctiva/sclera: Conjunctivae normal.  Neck:     Musculoskeletal: Normal range of motion and neck supple.     Thyroid: No thyromegaly.     Vascular: No carotid bruit or JVD.  Cardiovascular:     Rate and Rhythm: Normal rate and regular rhythm.     Heart sounds: Normal heart sounds. No murmur.  Pulmonary:     Effort: Pulmonary effort is normal. No respiratory distress.     Breath sounds: Normal breath sounds. No wheezing or rales.  Chest:     Chest wall: No tenderness.  Musculoskeletal:        General: Tenderness present.     Thoracic back: She exhibits pain and spasm.     Lumbar back: She exhibits decreased range of motion, tenderness, pain and spasm.       Back:  Neurological:     Mental Status: She is alert and oriented to person, place, and time.    BP 124/90 (BP Location: Right Arm, Patient Position: Sitting, Cuff Size: Normal)   Pulse (!) 108  Temp 97.9 F (36.6 C) (Temporal)   Resp 18   Ht 5\' 2"  (1.575 m)   Wt 247 lb 3.2 oz (112.1 kg)   SpO2 98%   BMI 45.21 kg/m  Wt Readings from Last 3 Encounters:  08/15/19 247 lb 3.2 oz (112.1 kg)  08/08/19 235 lb (106.6 kg)  11/22/18 230 lb (104.3 kg)     Lab Results  Component Value Date   WBC 8.6 08/15/2019   HGB 14.3 08/15/2019   HCT 42.5 08/15/2019   PLT 209.0 08/15/2019   GLUCOSE 86 08/15/2019   CHOL 293 (H) 08/15/2019   TRIG 254.0 (H) 08/15/2019   HDL 61.70 08/15/2019   LDLDIRECT 192.0 08/15/2019   LDLCALC 130 (H) 05/03/2016   ALT 52 (H) 08/15/2019   AST  52 (H) 08/15/2019   NA 136 08/15/2019   K 3.8 08/15/2019   CL 97 08/15/2019   CREATININE 0.74 08/15/2019   BUN 10 08/15/2019   CO2 24 08/15/2019   TSH 1.87 08/15/2019   HGBA1C 5.8 05/03/2016   MICROALBUR <0.7 05/03/2016    Dg Chest 2 View  Result Date: 08/08/2019 CLINICAL DATA:  Pain, right chest wall pain EXAM: CHEST - 2 VIEW COMPARISON:  November 22, 2018 FINDINGS: The heart size and mediastinal contours are within normal limits. Both lungs are clear. The visualized skeletal structures are unremarkable. IMPRESSION: No active cardiopulmonary disease. Electronically Signed   By: Jonna ClarkBindu  Avutu M.D.   On: 08/08/2019 17:01   Dg Shoulder Right  Result Date: 08/08/2019 CLINICAL DATA:  Pain and discomfort EXAM: RIGHT SHOULDER - 2+ VIEW COMPARISON:  None. FINDINGS: Somewhat limited due to technique. No definite fracture or dislocation. Normal bone mineralization seen throughout. IMPRESSION: Negative. Electronically Signed   By: Jonna ClarkBindu  Avutu M.D.   On: 08/08/2019 17:01     Assessment & Plan:  Plan  I have discontinued Dana Patterson's sulfamethoxazole-trimethoprim and ciprofloxacin. I am also having her start on meloxicam and cyclobenzaprine. Additionally, I am having her maintain her Klor-Con M20, loratadine, hydrochlorothiazide, methocarbamol, and naproxen.  Meds ordered this encounter  Medications  . meloxicam (MOBIC) 15 MG tablet    Sig: Take 1 tablet (15 mg total) by mouth daily.    Dispense:  30 tablet    Refill:  0  . cyclobenzaprine (FLEXERIL) 10 MG tablet    Sig: Take 1 tablet (10 mg total) by mouth 3 (three) times daily as needed for muscle spasms.    Dispense:  30 tablet    Refill:  0    Problem List Items Addressed This Visit      Unprioritized   Morbid obesity (HCC)   Relevant Orders   Amb Ref to Medical Weight Management   TSH (Completed)   CBC with Differential/Platelet (Completed)   Lipid panel (Completed)   Comprehensive metabolic panel (Completed)   Vitamin D  (25 hydroxy) (Completed)   Insulin, random (Completed)    Other Visit Diagnoses    Urinary frequency    -  Primary   Relevant Orders   POC Urinalysis Dipstick OB (Completed)   Thoracic spine pain       Relevant Medications   meloxicam (MOBIC) 15 MG tablet   cyclobenzaprine (FLEXERIL) 10 MG tablet   Other Relevant Orders   DG Thoracic Spine 2 View (Completed)   Ambulatory referral to Chiropractic   TSH (Completed)   CBC with Differential/Platelet (Completed)   Lipid panel (Completed)   Comprehensive metabolic panel (Completed)   Chronic right-sided low back pain without  sciatica       Relevant Medications   meloxicam (MOBIC) 15 MG tablet   cyclobenzaprine (FLEXERIL) 10 MG tablet   Other Relevant Orders   DG Lumbar Spine Complete (Completed)   Ambulatory referral to Chiropractic   TSH (Completed)   CBC with Differential/Platelet (Completed)   Lipid panel (Completed)   Comprehensive metabolic panel (Completed)      Follow-up: Return if symptoms worsen or fail to improve, for fasting, annual exam.  Ann Held, DO

## 2019-08-16 LAB — COMPREHENSIVE METABOLIC PANEL
ALT: 52 U/L — ABNORMAL HIGH (ref 0–35)
AST: 52 U/L — ABNORMAL HIGH (ref 0–37)
Albumin: 4.8 g/dL (ref 3.5–5.2)
Alkaline Phosphatase: 75 U/L (ref 39–117)
BUN: 10 mg/dL (ref 6–23)
CO2: 24 mEq/L (ref 19–32)
Calcium: 10.4 mg/dL (ref 8.4–10.5)
Chloride: 97 mEq/L (ref 96–112)
Creatinine, Ser: 0.74 mg/dL (ref 0.40–1.20)
GFR: 102.39 mL/min (ref >60.00)
Glucose, Bld: 86 mg/dL (ref 70–99)
Potassium: 3.8 mEq/L (ref 3.5–5.1)
Sodium: 136 mEq/L (ref 135–145)
Total Bilirubin: 0.3 mg/dL (ref 0.2–1.2)
Total Protein: 7.9 g/dL (ref 6.0–8.3)

## 2019-08-16 LAB — LIPID PANEL
Cholesterol: 293 mg/dL — ABNORMAL HIGH (ref 0–200)
HDL: 61.7 mg/dL (ref >39.00)
NonHDL: 231.79
Total CHOL/HDL Ratio: 5
Triglycerides: 254 mg/dL — ABNORMAL HIGH (ref 0.0–149.0)
VLDL: 50.8 mg/dL — ABNORMAL HIGH (ref 0.0–40.0)

## 2019-08-16 LAB — CBC WITH DIFFERENTIAL/PLATELET
Basophils Absolute: 0.1 10*3/uL (ref 0.0–0.1)
Basophils Relative: 1 % (ref 0.0–3.0)
Eosinophils Absolute: 0.4 10*3/uL (ref 0.0–0.7)
Eosinophils Relative: 5 % (ref 0.0–5.0)
HCT: 42.5 % (ref 36.0–46.0)
Hemoglobin: 14.3 g/dL (ref 12.0–15.0)
Lymphocytes Relative: 43.1 % (ref 12.0–46.0)
Lymphs Abs: 3.7 10*3/uL (ref 0.7–4.0)
MCHC: 33.6 g/dL (ref 30.0–36.0)
MCV: 92.5 fl (ref 78.0–100.0)
Monocytes Absolute: 0.6 10*3/uL (ref 0.1–1.0)
Monocytes Relative: 6.9 % (ref 3.0–12.0)
Neutro Abs: 3.8 10*3/uL (ref 1.4–7.7)
Neutrophils Relative %: 44 % (ref 43.0–77.0)
Platelets: 209 10*3/uL (ref 150.0–400.0)
RBC: 4.59 Mil/uL (ref 3.87–5.11)
RDW: 13.4 % (ref 11.5–15.5)
WBC: 8.6 10*3/uL (ref 4.0–10.5)

## 2019-08-16 LAB — LDL CHOLESTEROL, DIRECT: Direct LDL: 192 mg/dL

## 2019-08-16 LAB — INSULIN, RANDOM: Insulin: 33.5 u[IU]/mL — ABNORMAL HIGH

## 2019-08-16 LAB — TSH: TSH: 1.87 u[IU]/mL (ref 0.35–4.50)

## 2019-08-16 LAB — VITAMIN D 25 HYDROXY (VIT D DEFICIENCY, FRACTURES): VITD: 16.98 ng/mL — ABNORMAL LOW (ref 30.00–100.00)

## 2019-08-16 MED ORDER — FLUTICASONE PROPIONATE 50 MCG/ACT NA SUSP: 2.0000 | Freq: Every day | NASAL | 0 refills | Status: DC

## 2019-08-18 ENCOUNTER — Other Ambulatory Visit: Payer: Self-pay | Admitting: Family Medicine

## 2019-08-18 DIAGNOSIS — E785 Hyperlipidemia, unspecified: Secondary | ICD-10-CM

## 2019-08-20 ENCOUNTER — Other Ambulatory Visit: Payer: Self-pay | Admitting: Family Medicine

## 2019-08-29 ENCOUNTER — Other Ambulatory Visit: Payer: Self-pay | Admitting: Family Medicine

## 2019-08-29 DIAGNOSIS — E876 Hypokalemia: Secondary | ICD-10-CM

## 2019-08-29 DIAGNOSIS — I10 Essential (primary) hypertension: Secondary | ICD-10-CM

## 2019-09-06 ENCOUNTER — Other Ambulatory Visit: Payer: Self-pay | Admitting: Family Medicine

## 2019-09-06 DIAGNOSIS — M545 Low back pain, unspecified: Secondary | ICD-10-CM

## 2019-09-06 DIAGNOSIS — G8929 Other chronic pain: Secondary | ICD-10-CM

## 2019-09-06 DIAGNOSIS — M546 Pain in thoracic spine: Secondary | ICD-10-CM

## 2019-10-26 ENCOUNTER — Other Ambulatory Visit: Payer: Self-pay | Admitting: Family Medicine

## 2019-10-26 DIAGNOSIS — I10 Essential (primary) hypertension: Secondary | ICD-10-CM

## 2019-11-01 ENCOUNTER — Other Ambulatory Visit: Payer: Self-pay

## 2019-11-04 ENCOUNTER — Encounter: Payer: Self-pay | Admitting: Family Medicine

## 2019-11-04 ENCOUNTER — Other Ambulatory Visit (HOSPITAL_COMMUNITY)
Admission: RE | Admit: 2019-11-04 | Discharge: 2019-11-04 | Disposition: A | Discharge status: Home / Self Care | Payer: Self-pay | Source: Ambulatory Visit | Attending: Family Medicine | Admitting: Family Medicine

## 2019-11-04 ENCOUNTER — Ambulatory Visit (INDEPENDENT_AMBULATORY_CARE_PROVIDER_SITE_OTHER): Payer: Self-pay | Admitting: Family Medicine

## 2019-11-04 ENCOUNTER — Other Ambulatory Visit: Payer: Self-pay

## 2019-11-04 VITALS — BP 124/80 | HR 93 | Temp 97.4°F | Resp 18 | Ht 62.0 in | Wt 241.2 lb

## 2019-11-04 DIAGNOSIS — R300 Dysuria: Secondary | ICD-10-CM

## 2019-11-04 DIAGNOSIS — Z0000 Encounter for general adult medical examination without abnormal findings: Secondary | ICD-10-CM | POA: Insufficient documentation

## 2019-11-04 DIAGNOSIS — H538 Other visual disturbances: Secondary | ICD-10-CM

## 2019-11-04 DIAGNOSIS — N83201 Unspecified ovarian cyst, right side: Secondary | ICD-10-CM

## 2019-11-04 DIAGNOSIS — M546 Pain in thoracic spine: Secondary | ICD-10-CM

## 2019-11-04 DIAGNOSIS — Z20828 Contact with and (suspected) exposure to other viral communicable diseases: Secondary | ICD-10-CM

## 2019-11-04 DIAGNOSIS — Z20822 Contact with and (suspected) exposure to covid-19: Secondary | ICD-10-CM

## 2019-11-04 DIAGNOSIS — Z1211 Encounter for screening for malignant neoplasm of colon: Secondary | ICD-10-CM

## 2019-11-04 DIAGNOSIS — M545 Low back pain, unspecified: Secondary | ICD-10-CM

## 2019-11-04 DIAGNOSIS — R1010 Upper abdominal pain, unspecified: Secondary | ICD-10-CM

## 2019-11-04 LAB — POCT URINALYSIS DIP (MANUAL ENTRY)
Bilirubin, UA: NEGATIVE
Blood, UA: NEGATIVE
Glucose, UA: NEGATIVE mg/dL
Ketones, POC UA: NEGATIVE mg/dL
Leukocytes, UA: NEGATIVE
Nitrite, UA: NEGATIVE
Protein Ur, POC: NEGATIVE mg/dL
Spec Grav, UA: 1.025 (ref 1.010–1.025)
Urobilinogen, UA: 0.2 E.U./dL
pH, UA: 6 (ref 5.0–8.0)

## 2019-11-04 LAB — LIPID PANEL
Cholesterol: 272 mg/dL — ABNORMAL HIGH (ref 0–200)
HDL: 50.2 mg/dL (ref >39.00)
NonHDL: 221.96
Total CHOL/HDL Ratio: 5
Triglycerides: 258 mg/dL — ABNORMAL HIGH (ref 0.0–149.0)
VLDL: 51.6 mg/dL — ABNORMAL HIGH (ref 0.0–40.0)

## 2019-11-04 LAB — COMPREHENSIVE METABOLIC PANEL
ALT: 40 U/L — ABNORMAL HIGH (ref 0–35)
AST: 26 U/L (ref 0–37)
Albumin: 4.6 g/dL (ref 3.5–5.2)
Alkaline Phosphatase: 77 U/L (ref 39–117)
BUN: 10 mg/dL (ref 6–23)
CO2: 26 mEq/L (ref 19–32)
Calcium: 10 mg/dL (ref 8.4–10.5)
Chloride: 103 mEq/L (ref 96–112)
Creatinine, Ser: 0.72 mg/dL (ref 0.40–1.20)
GFR: 105.58 mL/min (ref >60.00)
Glucose, Bld: 85 mg/dL (ref 70–99)
Potassium: 3.9 mEq/L (ref 3.5–5.1)
Sodium: 140 mEq/L (ref 135–145)
Total Bilirubin: 0.3 mg/dL (ref 0.2–1.2)
Total Protein: 7.5 g/dL (ref 6.0–8.3)

## 2019-11-04 LAB — CBC WITH DIFFERENTIAL/PLATELET
Basophils Absolute: 0.1 10*3/uL (ref 0.0–0.1)
Basophils Relative: 1 % (ref 0.0–3.0)
Eosinophils Absolute: 0.4 10*3/uL (ref 0.0–0.7)
Eosinophils Relative: 5.2 % — ABNORMAL HIGH (ref 0.0–5.0)
HCT: 38.8 % (ref 36.0–46.0)
Hemoglobin: 13.2 g/dL (ref 12.0–15.0)
Lymphocytes Relative: 49.4 % — ABNORMAL HIGH (ref 12.0–46.0)
Lymphs Abs: 3.7 10*3/uL (ref 0.7–4.0)
MCHC: 34 g/dL (ref 30.0–36.0)
MCV: 91.7 fl (ref 78.0–100.0)
Monocytes Absolute: 0.6 10*3/uL (ref 0.1–1.0)
Monocytes Relative: 7.5 % (ref 3.0–12.0)
Neutro Abs: 2.7 10*3/uL (ref 1.4–7.7)
Neutrophils Relative %: 36.9 % — ABNORMAL LOW (ref 43.0–77.0)
Platelets: 270 10*3/uL (ref 150.0–400.0)
RBC: 4.24 Mil/uL (ref 3.87–5.11)
RDW: 13.3 % (ref 11.5–15.5)
WBC: 7.4 10*3/uL (ref 4.0–10.5)

## 2019-11-04 LAB — LDL CHOLESTEROL, DIRECT: Direct LDL: 172 mg/dL

## 2019-11-04 LAB — TSH: TSH: 1.56 u[IU]/mL (ref 0.35–4.50)

## 2019-11-04 LAB — SARS-COV-2 IGG: SARS-COV-2 IgG: 0.05

## 2019-11-04 NOTE — Patient Instructions (Signed)

## 2019-11-04 NOTE — Progress Notes (Signed)
Subjective:     Dana Patterson is a 43 y.o. female and is here for a comprehensive physical exam. The patient reports problems - still c/o mid and low back pain,,  it never really improved . She also c/o blurry vision and she needs eye exam  Social History   Socioeconomic History  . Marital status: Married    Spouse name: Not on file  . Number of children: Not on file  . Years of education: Not on file  . Highest education level: Not on file  Occupational History  . Not on file  Tobacco Use  . Smoking status: Never Smoker  . Smokeless tobacco: Never Used  Substance and Sexual Activity  . Alcohol use: No    Alcohol/week: 0.0 standard drinks  . Drug use: No  . Sexual activity: Not on file  Other Topics Concern  . Not on file  Social History Narrative  . Not on file   Social Determinants of Health   Financial Resource Strain:   . Difficulty of Paying Living Expenses: Not on file  Food Insecurity:   . Worried About Programme researcher, broadcasting/film/videounning Out of Food in the Last Year: Not on file  . Ran Out of Food in the Last Year: Not on file  Transportation Needs:   . Lack of Transportation (Medical): Not on file  . Lack of Transportation (Non-Medical): Not on file  Physical Activity:   . Days of Exercise per Week: Not on file  . Minutes of Exercise per Session: Not on file  Stress:   . Feeling of Stress : Not on file  Social Connections:   . Frequency of Communication with Friends and Family: Not on file  . Frequency of Social Gatherings with Friends and Family: Not on file  . Attends Religious Services: Not on file  . Active Member of Clubs or Organizations: Not on file  . Attends BankerClub or Organization Meetings: Not on file  . Marital Status: Not on file  Intimate Partner Violence:   . Fear of Current or Ex-Partner: Not on file  . Emotionally Abused: Not on file  . Physically Abused: Not on file  . Sexually Abused: Not on file   Health Maintenance  Topic Date Due  . HIV Screening   12/19/1988  . INFLUENZA VACCINE  02/19/2020 (Originally 06/22/2019)  . TETANUS/TDAP  04/21/2025    The following portions of the patient's history were reviewed and updated as appropriate:  She  has a past medical history of Allergy, HTN (hypertension) (10/29/2015), Hyperlipidemia, and Vaginal Pap smear, abnormal. She does not have any pertinent problems on file. She  has a past surgical history that includes Abdominal hysterectomy and Tubal ligation. Her family history includes Heart disease in her mother; Hypertension in her father and mother; Kidney disease in her mother. She  reports that she has never smoked. She has never used smokeless tobacco. She reports that she does not drink alcohol or use drugs. She has a current medication list which includes the following prescription(s): cyclobenzaprine, fluticasone, hydrochlorothiazide, klor-con m20, loratadine, meloxicam, methocarbamol, and naproxen. Current Outpatient Medications on File Prior to Visit  Medication Sig Dispense Refill  . cyclobenzaprine (FLEXERIL) 10 MG tablet Take 1 tablet (10 mg total) by mouth 3 (three) times daily as needed for muscle spasms. 30 tablet 0  . fluticasone (FLONASE) 50 MCG/ACT nasal spray SPRAY 2 SPRAYS INTO EACH NOSTRIL EVERY DAY 48 mL 0  . hydrochlorothiazide (HYDRODIURIL) 25 MG tablet TAKE 1 TABLET BY MOUTH  EVERY DAY 30 tablet 0  . KLOR-CON M20 20 MEQ tablet TAKE 2 TABLETS BY MOUTH DAILY 60 tablet 2  . loratadine (CLARITIN) 10 MG tablet TAKE 1 TABLET BY MOUTH EVERY DAY 30 tablet 4  . meloxicam (MOBIC) 15 MG tablet TAKE 1 TABLET BY MOUTH EVERY DAY 30 tablet 0  . methocarbamol (ROBAXIN) 500 MG tablet Take 2 tablets (1,000 mg total) by mouth every 8 (eight) hours as needed (muscle pain). 20 tablet 0  . naproxen (NAPROSYN) 500 MG tablet Take 1 tablet (500 mg total) by mouth 2 (two) times daily as needed. 30 tablet 0   No current facility-administered medications on file prior to visit.   She has No Known  Allergies..  Review of Systems Review of Systems  Constitutional: Negative for activity change, appetite change and fatigue.  HENT: Negative for hearing loss, congestion, tinnitus and ear discharge.  dentist q60m Eyes: Negative for visual disturbance (see optho q1y -- vision corrected to 20/20 with glasses).  Respiratory: Negative for cough, chest tightness and shortness of breath.   Cardiovascular: Negative for chest pain, palpitations and leg swelling.  Gastrointestinal: Negative for abdominal pain, diarrhea, constipation and abdominal distention.  Genitourinary: Negative for urgency, frequency, decreased urine volume and difficulty urinating.  Musculoskeletal: + mD AND LOW BACK PAIN Skin: Negative for color change, pallor and rash.  Neurological: Negative for dizziness, light-headedness, numbness and headaches.  Hematological: Negative for adenopathy. Does not bruise/bleed easily.  Psychiatric/Behavioral: Negative for suicidal ideas, confusion, sleep disturbance, self-injury, dysphoric mood, decreased concentration and agitation.       Objective:    BP 124/80 (BP Location: Right Arm, Patient Position: Sitting, Cuff Size: Normal)   Pulse 93   Temp (!) 97.4 F (36.3 C) (Temporal)   Resp 18   Ht 5\' 2"  (1.575 m)   Wt 241 lb 3.2 oz (109.4 kg)   SpO2 96%   BMI 44.12 kg/m  General appearance: alert, cooperative, appears stated age and no distress Head: Normocephalic, without obvious abnormality, atraumatic Eyes: negative findings: lids and lashes normal, conjunctivae and sclerae normal and pupils equal, round, reactive to light and accomodation Ears: normal TM's and external ear canals both ears Nose: Nares normal. Septum midline. Mucosa normal. No drainage or sinus tenderness. Throat: lips, mucosa, and tongue normal; teeth and gums normal Neck: no adenopathy, no carotid bruit, no JVD, supple, symmetrical, trachea midline and thyroid not enlarged, symmetric, no  tenderness/mass/nodules Back: symmetric, no curvature. ROM normal. No CVA tenderness. Lungs: clear to auscultation bilaterally Breasts: normal appearance, no masses or tenderness Heart: regular rate and rhythm, S1, S2 normal, no murmur, click, rub or gallop Abdomen: soft, non-tender; bowel sounds normal; no masses,  no organomegaly Pelvic: vaginal smear done , rectal heme neg Brune stool Extremities: extremities normal, atraumatic, no cyanosis or edema Pulses: 2+ and symmetric Skin: Skin color, texture, turgor normal. No rashes or lesions Lymph nodes: Cervical, supraclavicular, and axillary nodes normal. Neurologic: Alert and oriented X 3, normal strength and tone. Normal symmetric reflexes. Normal coordination and gait    Assessment:    Healthy female exam.      Plan:  ghm utd Check labs  See After Visit Summary for Counseling Recommendations    1. Dysuria Check urine  - POCT urinalysis dipstick - Urine Culture  2. Pain of upper abdomen >6 weeks -- check mri  - US Abdomen Complete ([facility]); Future  3. Cyst of right ovary Recheck [place] ---f/u gyn if necessary  - US Pelvic Complete With  Transvaginal; Future  4. Acute right-sided low back pain without sciatica  - MR Lumbar Spine Wo Contrast; Future  5. Thoracic spine pain  - MR Thoracic Spine Wo Contrast; Future  6. Preventative health care See above  - Lipid panel - CBC with Differential - Comprehensive metabolic panel - TSH - Cytology - PAP( [facility]) - LDL cholesterol, direct  7. Exposure to COVID-19 virus  - SARS-COV-2 IgG  8. Colon cancer screening  - Ambulatory referral to Gastroenterology  9. Blurry vision  - Ambulatory referral to Ophthalmology

## 2019-11-05 ENCOUNTER — Ambulatory Visit (HOSPITAL_BASED_OUTPATIENT_CLINIC_OR_DEPARTMENT_OTHER)
Admission: RE | Admit: 2019-11-05 | Discharge: 2019-11-05 | Disposition: A | Discharge status: Home / Self Care | Payer: Self-pay | Source: Ambulatory Visit | Attending: Family Medicine | Admitting: Family Medicine

## 2019-11-05 ENCOUNTER — Other Ambulatory Visit: Payer: Self-pay | Admitting: Family Medicine

## 2019-11-05 DIAGNOSIS — R1013 Epigastric pain: Secondary | ICD-10-CM

## 2019-11-05 DIAGNOSIS — N83201 Unspecified ovarian cyst, right side: Secondary | ICD-10-CM

## 2019-11-05 DIAGNOSIS — R1010 Upper abdominal pain, unspecified: Secondary | ICD-10-CM | POA: Insufficient documentation

## 2019-11-05 DIAGNOSIS — E785 Hyperlipidemia, unspecified: Secondary | ICD-10-CM

## 2019-11-05 LAB — URINE CULTURE
MICRO NUMBER:: 1195169
SPECIMEN QUALITY:: ADEQUATE

## 2019-11-05 IMAGING — US US PELVIS COMPLETE WITH TRANSVAGINAL
1 series · 14 of 25 positions shown · non-contrast
Comparison: [DATE]

CLINICAL DATA: RIGHT ovarian cyst

EXAM:
TRANSABDOMINAL AND TRANSVAGINAL ULTRASOUND OF PELVIS
TECHNIQUE: Both transabdominal and transvaginal ultrasound examinations of the
pelvis were performed. Transabdominal technique was performed for
global imaging of the pelvis including uterus, ovaries, adnexal
regions, and pelvic cul-de-sac. It was necessary to proceed with
endovaginal exam following the transabdominal exam to visualize the
LEFT ovary.

[Series 1: us pelvis complete with transvaginal · 36 acquisitions, 14 frames shown]
[im 1/36]
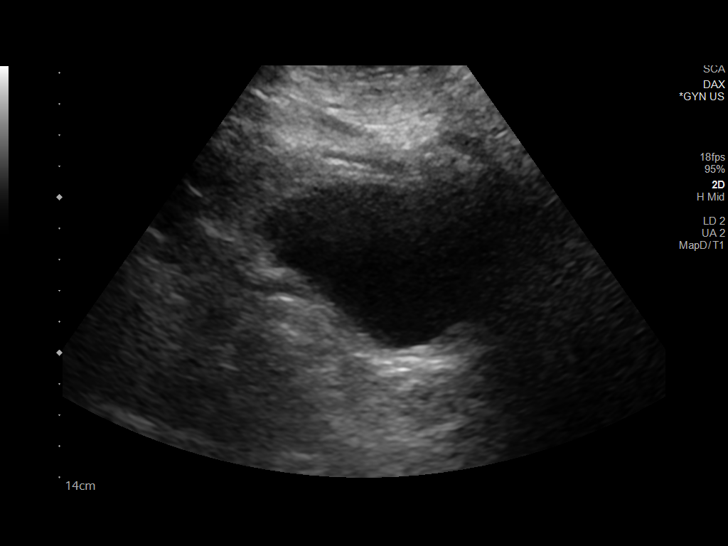
[im 3/36]
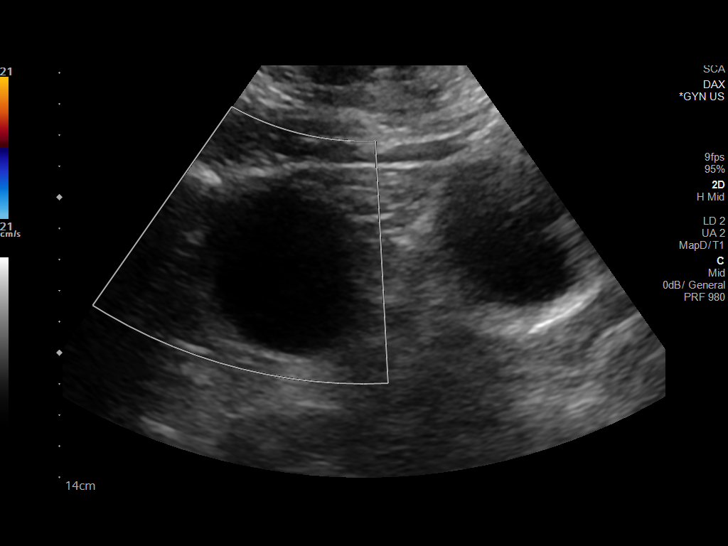
[im 6/36]
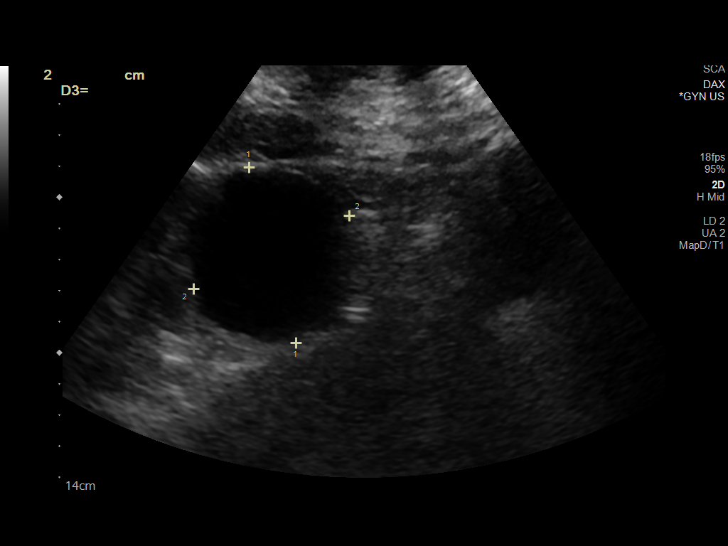
[im 9/36]
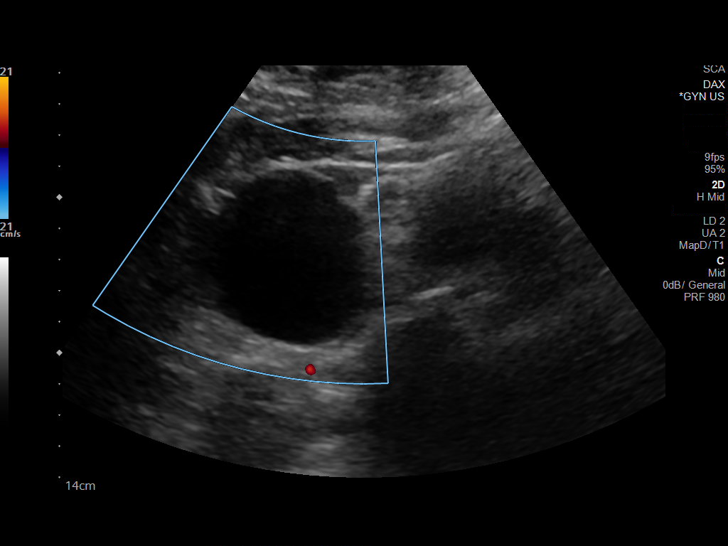
[im 12/36]
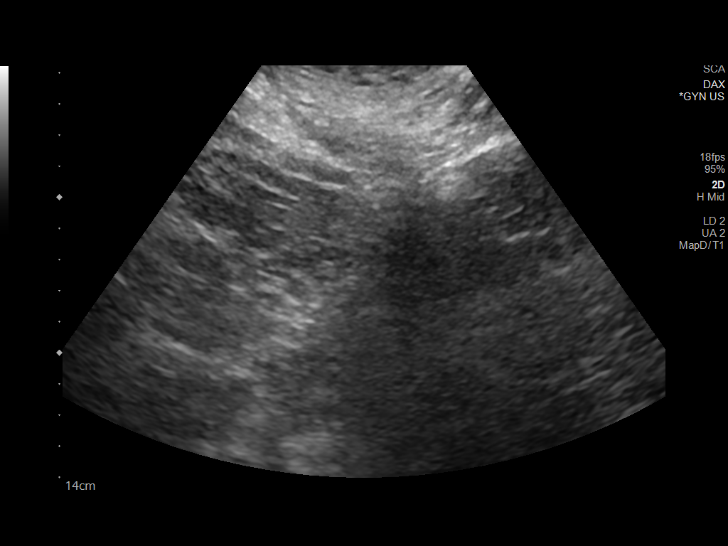
[im 14/36]
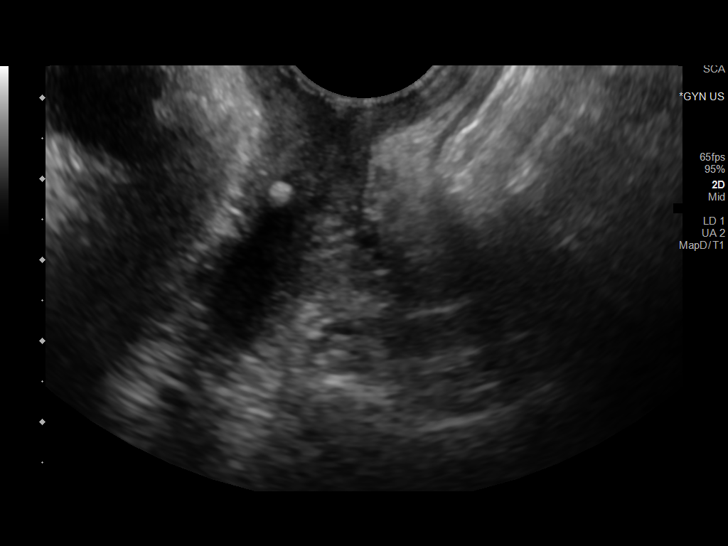
[im 17/36]
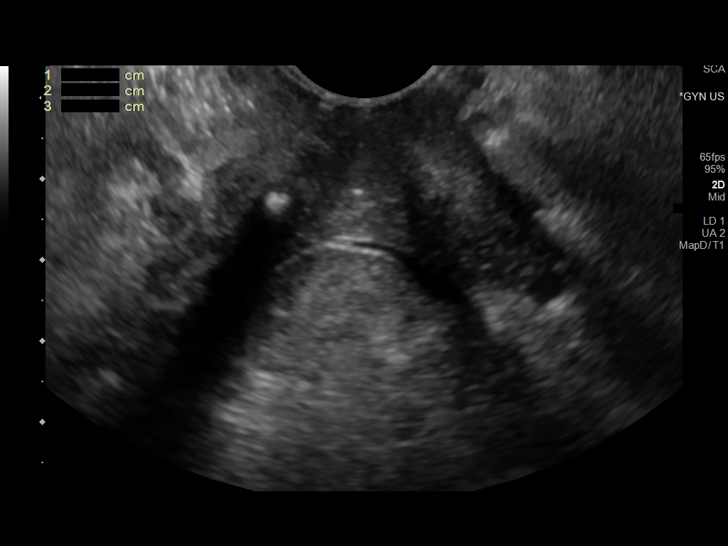
[im 19/36]
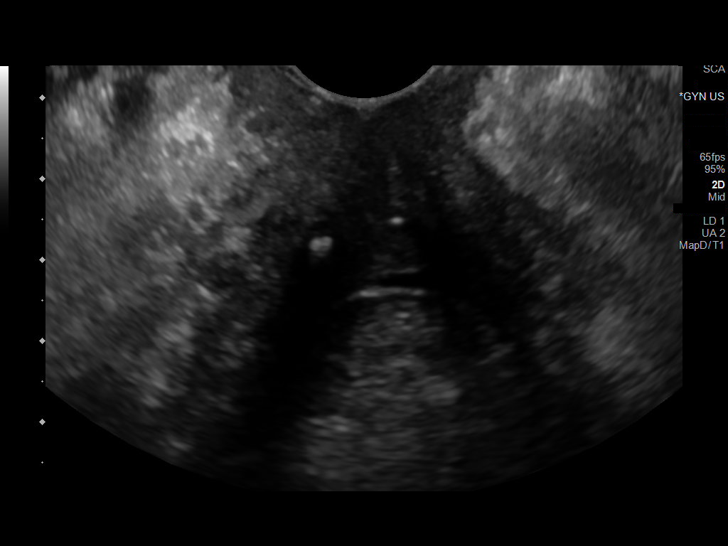
[im 22/36]
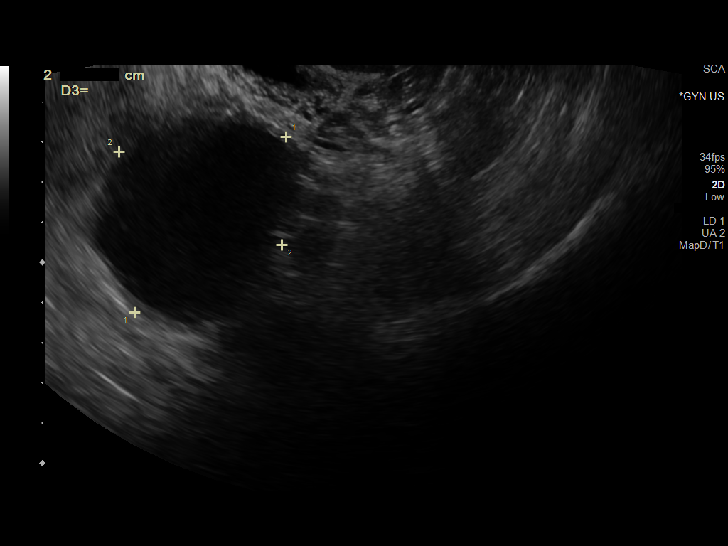
[im 24/36]
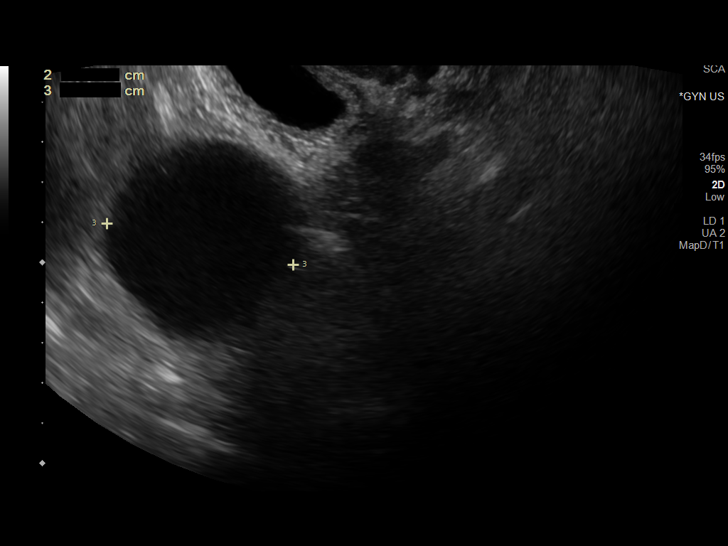
[im 27/36]
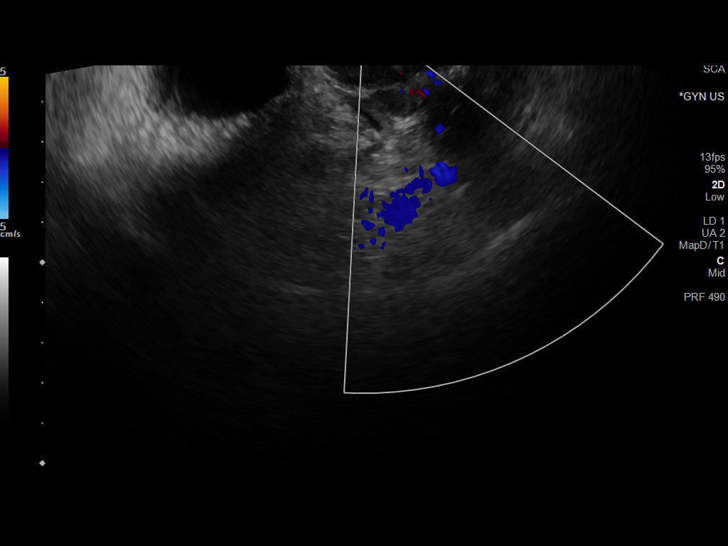
[im 30/36]
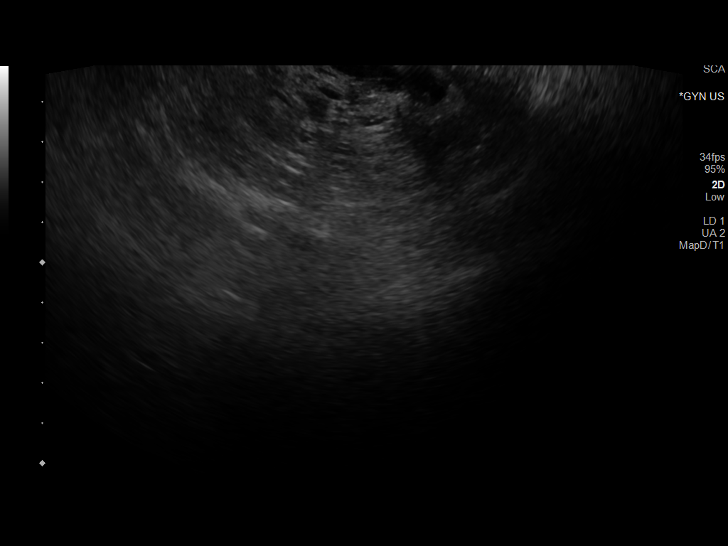
[im 33/36]
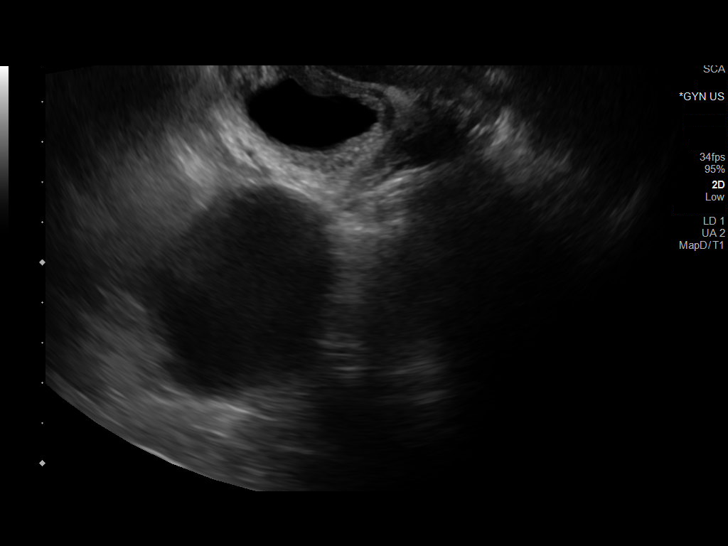
[im 36/36]
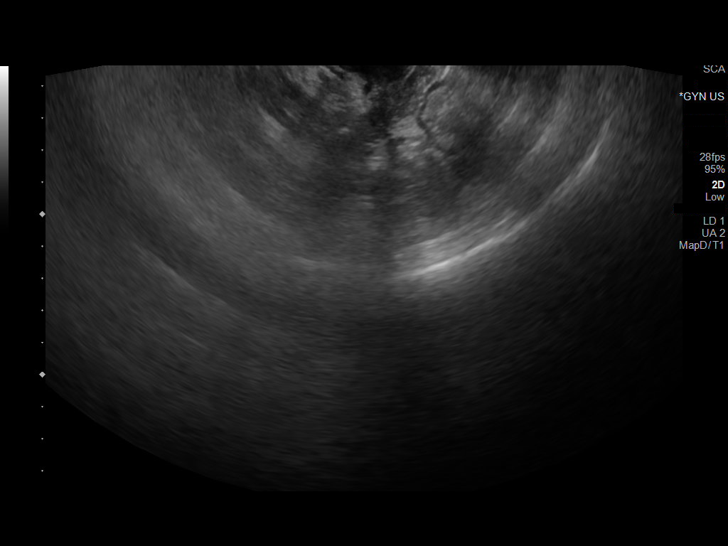

[14 of 25 positions shown; findings below may reference images not displayed]

FINDINGS: Uterus

Surgically absent

Endometrium

N/A

Right ovary

Measurements: 5.8 x 5.5 x 5.4 cm = volume: 91.4 mL. Large cyst with
simple features within RIGHT ovary, 5.0 x 4.6 x 4.8 cm.

Left ovary

Not visualized on either transabdominal or endovaginal imaging,
likely obscured by bowel

Other findings

4 mm echogenic focus at vaginal cuff question calcification versus
suture granuloma. No free pelvic fluid. No other pelvic masses.
IMPRESSION: Simple appearing cyst RIGHT ovary 5.0 cm greatest size.

Post hysterectomy with nonvisualization of LEFT ovary.

Remainder of exam unremarkable.

## 2019-11-05 IMAGING — US US ABDOMEN COMPLETE
1 series · 13 of 25 positions shown · non-contrast
Comparison: [DATE]

CLINICAL DATA: Upper abdominal pain

EXAM:
ABDOMEN ULTRASOUND COMPLETE

[Series 1: us abdomen complete · 13 of 56 slices shown]
[im 1/56]
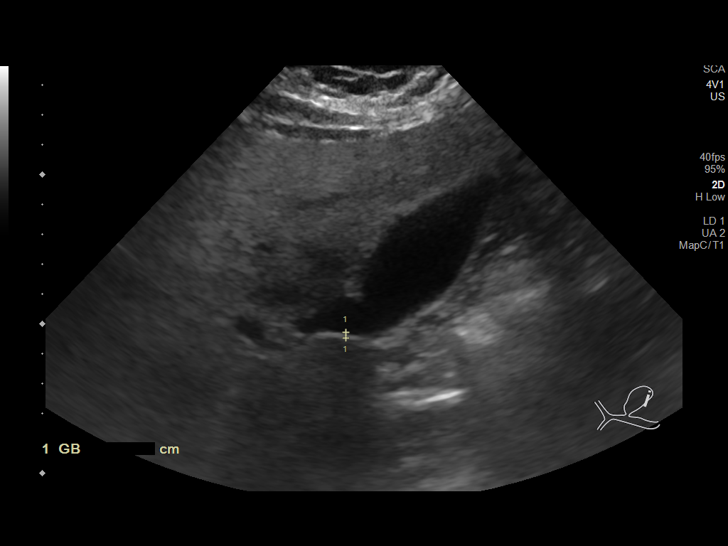
[im 5/56]
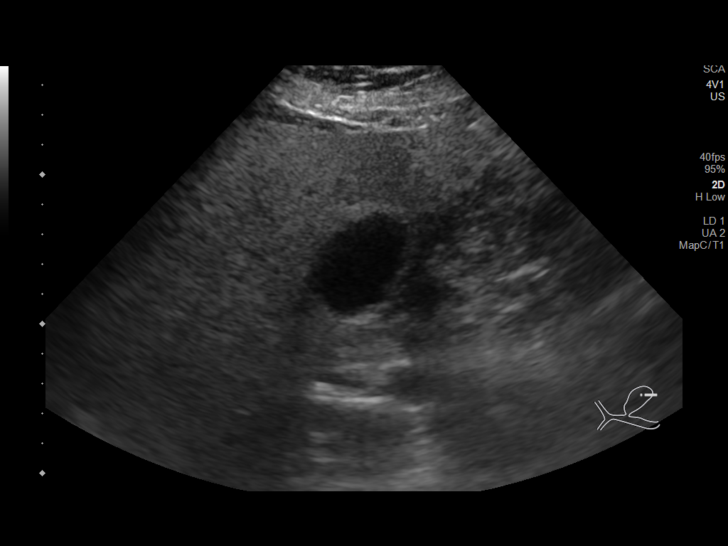
[im 10/56]
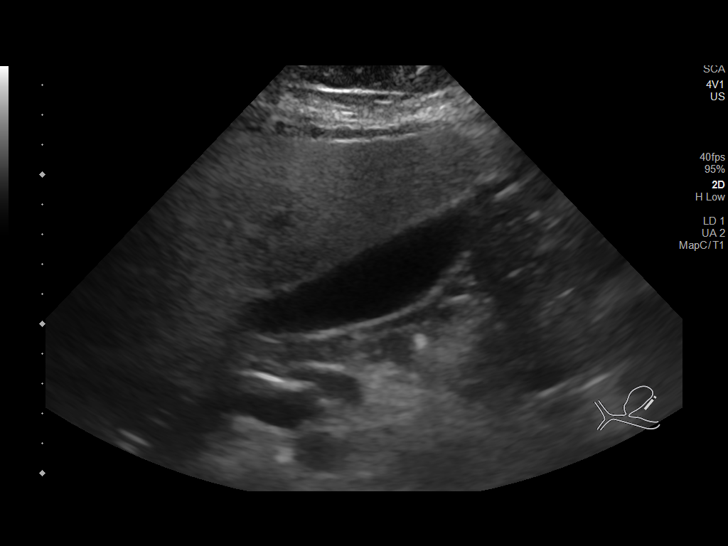
[im 14/56]
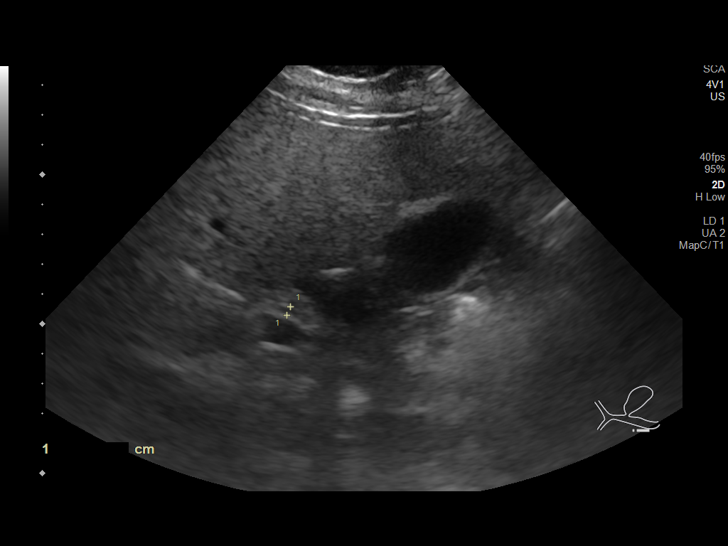
[im 19/56]
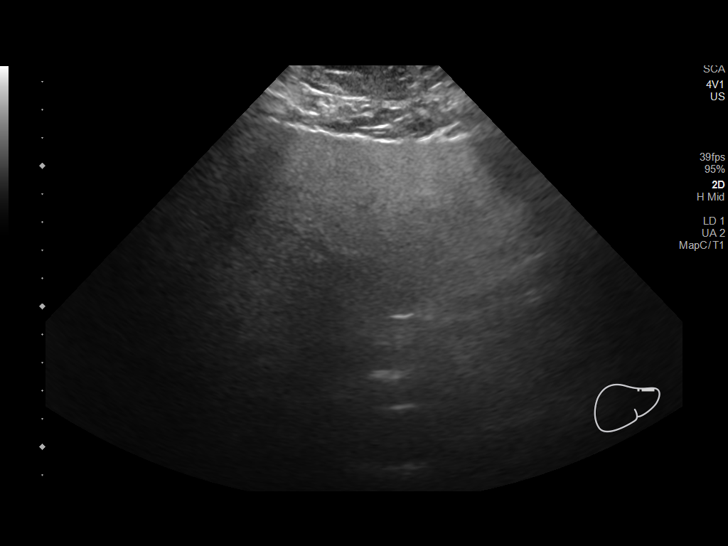
[im 23/56]
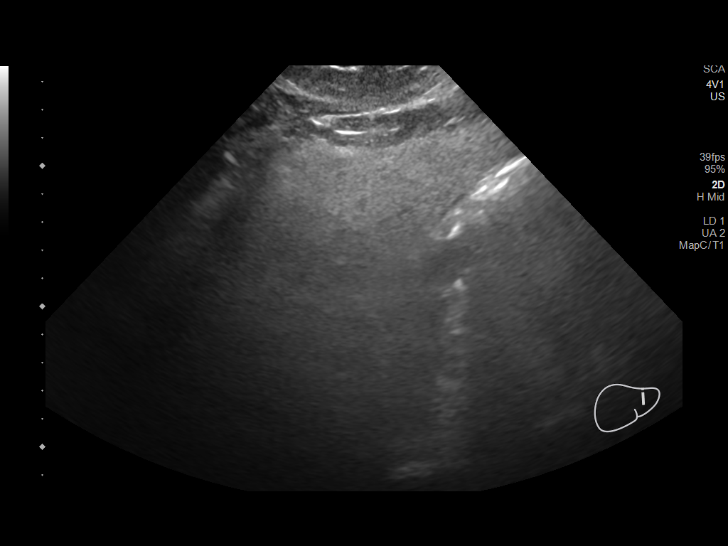
[im 28/56]
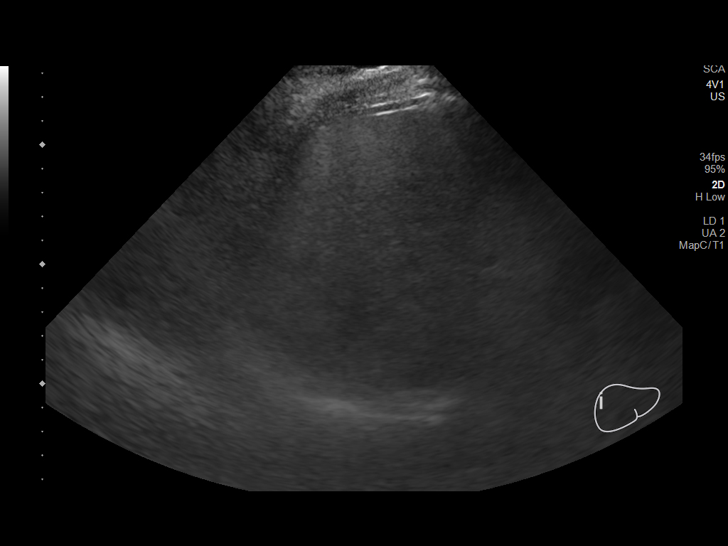
[im 33/56]
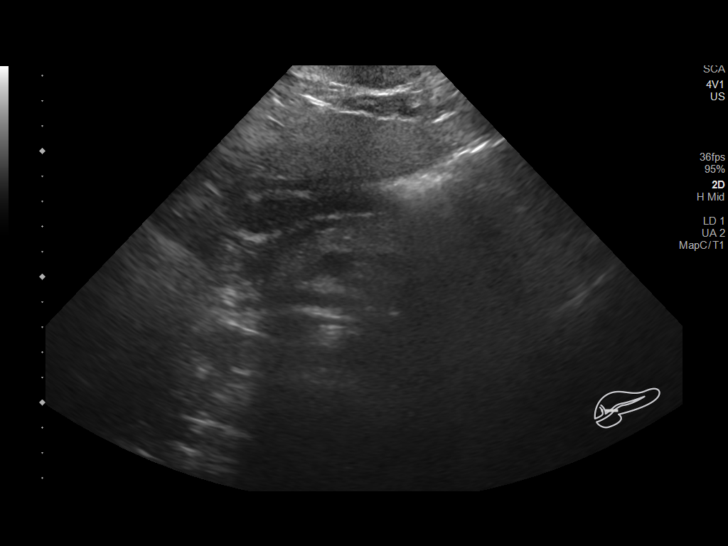
[im 37/56]
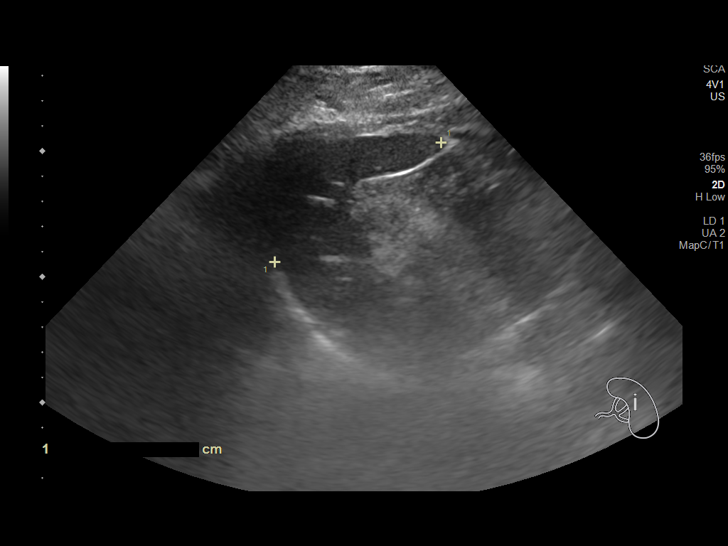
[im 42/56]
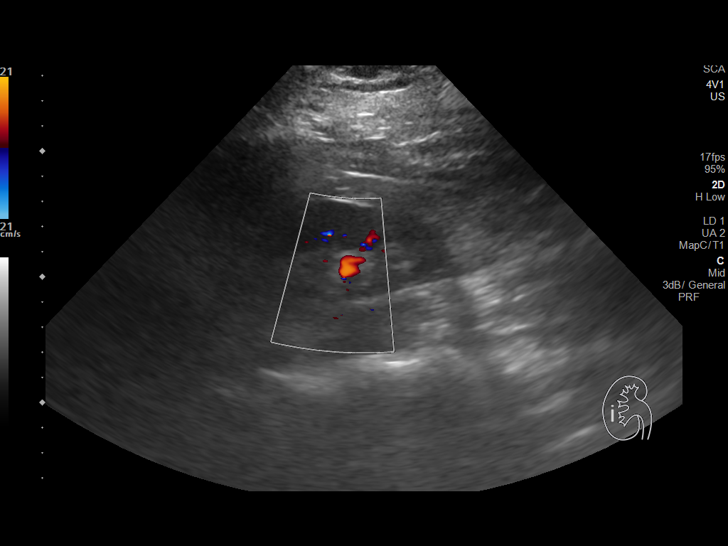
[im 46/56]
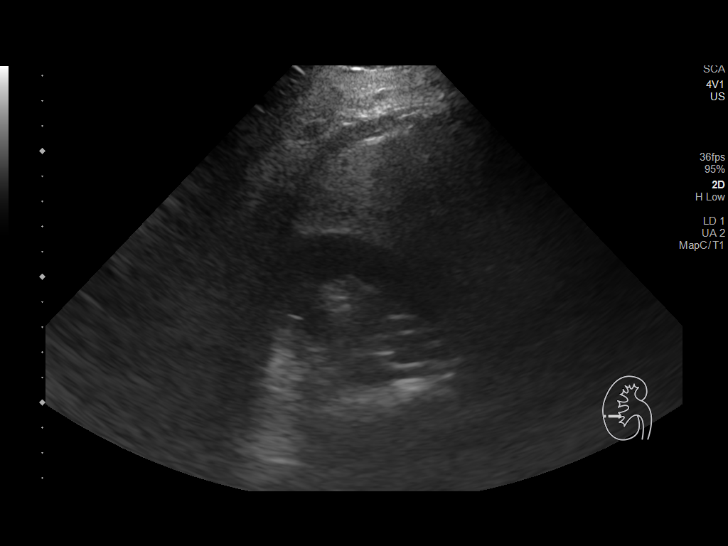
[im 51/56]
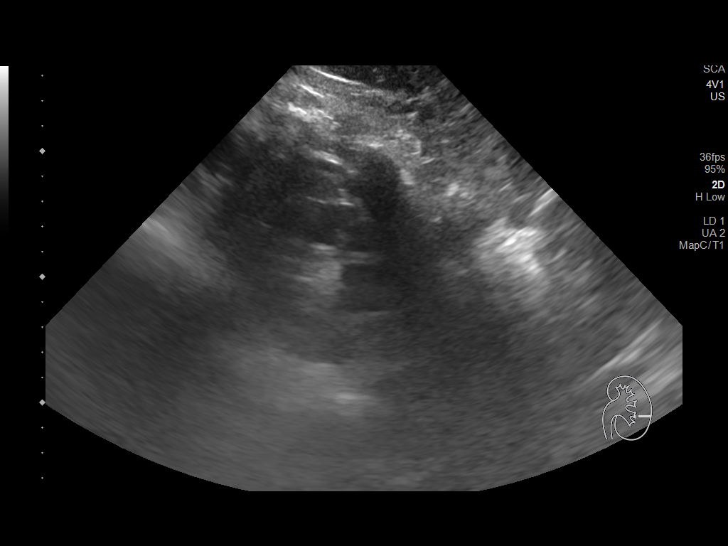
[im 56/56]
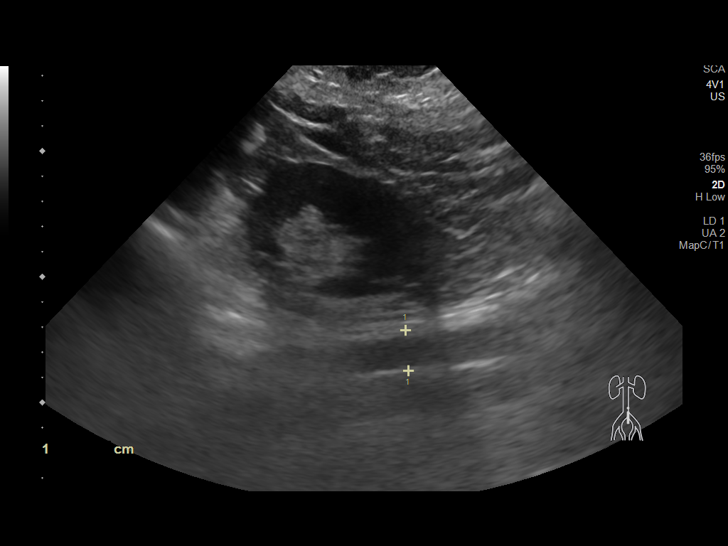

[13 of 25 positions shown; findings below may reference images not displayed]

FINDINGS: Gallbladder: Normally distended without stones or wall thickening.
No pericholecystic fluid or sonographic Murphy sign.

Common bile duct: Diameter: 5 mm, normal

Liver: Echogenic parenchyma, likely fatty infiltration though this
can be seen with cirrhosis and certain infiltrative disorders. No
gross hepatic mass or nodularity identified though sound
transmission through the liver is suboptimal and intrahepatic
pathology is not excluded by this study. Portal vein patent with
normal direction of blood flow towards the liver

IVC: Normal appearance

Pancreas: Normal appearance

Spleen: Normal appearance, 8.1 cm length

Right Kidney: Length: 10.7 cm. Normal morphology without mass or
hydronephrosis.

Left Kidney: Length: 11.0 cm. Normal morphology without mass or
hydronephrosis.

Abdominal aorta: Normal caliber

Other findings: No free fluid
IMPRESSION: Probable fatty infiltration of liver as above.

Suboptimal assessment of intrahepatic detail due to sound
attenuation, no gross abnormality identified though cannot exclude
intrahepatic pathology by this study; if better intrahepatic
visualization is required recommend MR or CT.

Remainder of exam unremarkable.

## 2019-11-08 LAB — CYTOLOGY - PAP
Chlamydia: NEGATIVE
Comment: NEGATIVE
Comment: NEGATIVE
Comment: NEGATIVE
Comment: NORMAL
Diagnosis: NEGATIVE
HSV1: NEGATIVE
HSV2: NEGATIVE
Neisseria Gonorrhea: NEGATIVE
Trichomonas: NEGATIVE

## 2019-11-09 ENCOUNTER — Other Ambulatory Visit: Payer: Self-pay

## 2019-11-09 ENCOUNTER — Ambulatory Visit (HOSPITAL_BASED_OUTPATIENT_CLINIC_OR_DEPARTMENT_OTHER)
Admission: RE | Admit: 2019-11-09 | Discharge: 2019-11-09 | Disposition: A | Discharge status: Home / Self Care | Payer: Self-pay | Source: Ambulatory Visit | Attending: Family Medicine | Admitting: Family Medicine

## 2019-11-09 DIAGNOSIS — M545 Low back pain, unspecified: Secondary | ICD-10-CM

## 2019-11-09 DIAGNOSIS — M546 Pain in thoracic spine: Secondary | ICD-10-CM | POA: Insufficient documentation

## 2019-11-09 IMAGING — MR MR LUMBAR SPINE W/O CM
6 series · 48 of 48 positions shown · non-contrast
Comparison: Lumbar spine radiographs [DATE]. Pelvic ultrasound
[DATE].

CLINICAL DATA: Mid and low back pain for 6 months. Tingling in the
hands.

EXAM:
MRI LUMBAR SPINE WITHOUT CONTRAST
TECHNIQUE: Multiplanar, multisequence MR imaging of the lumbar spine was
performed. No intravenous contrast was administered.

[Series 14: T2 · sagittal · 4.0mm · 1.02mm/px · 5 of 13 slices shown (1 of 2)]
[im 1/13]
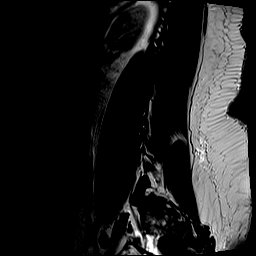
[im 4/13]
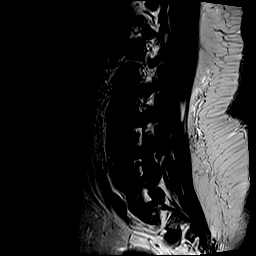
[im 7/13]
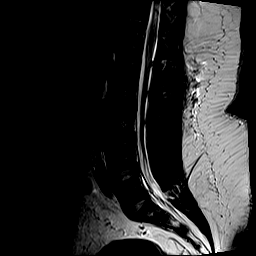
[im 10/13]
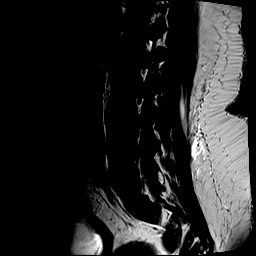
[im 13/13]
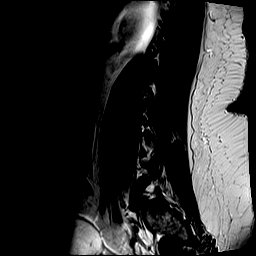

[Series 15: STIR · sagittal · 4.0mm · 1.02mm/px · 5 of 13 slices shown]
[im 1/13]
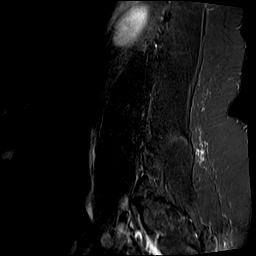
[im 4/13]
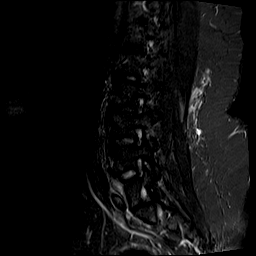
[im 7/13]
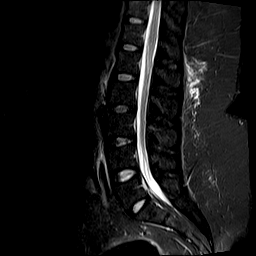
[im 10/13]
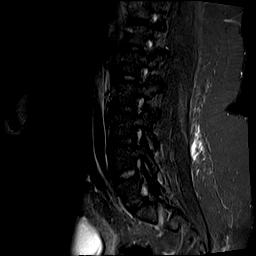
[im 13/13]
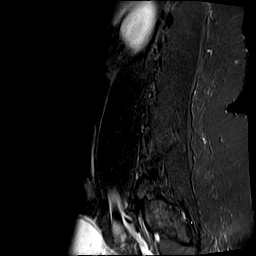

[Series 16: T1 · sagittal · 4.0mm · 1.02mm/px · 5 of 13 slices shown (1 of 2)]
[im 1/13]
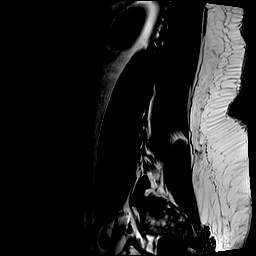
[im 4/13]
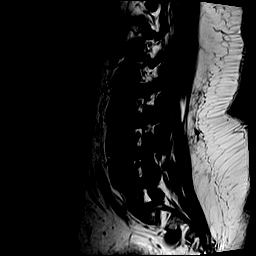
[im 7/13]
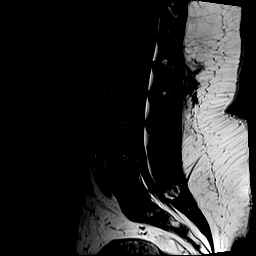
[im 10/13]
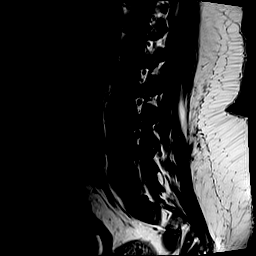
[im 13/13]
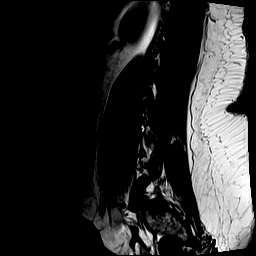

[Series 17: T2 · axial · 4.0mm · 0.78mm/px · z∈[-403,-213]mm · 14 of 34 slices shown (2 of 2)]
[im 1/34]
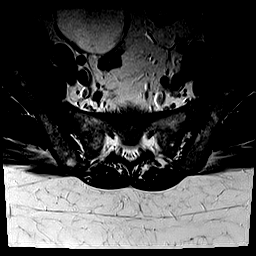
[im 3/34]
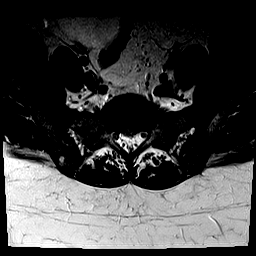
[im 6/34]
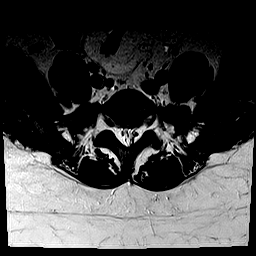
[im 8/34]
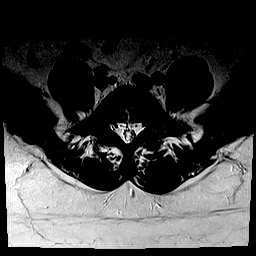
[im 11/34]
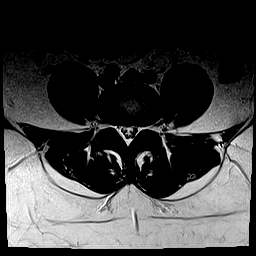
[im 13/34]
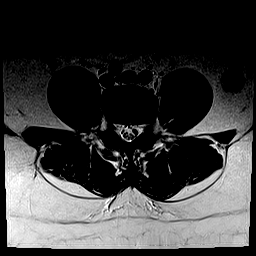
[im 16/34]
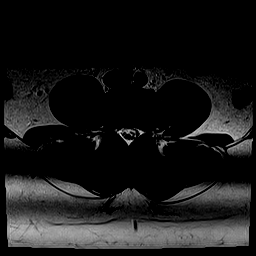
[im 18/34]
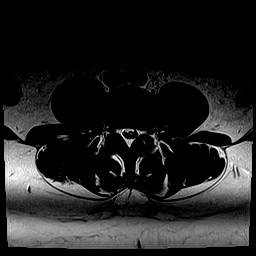
[im 21/34]
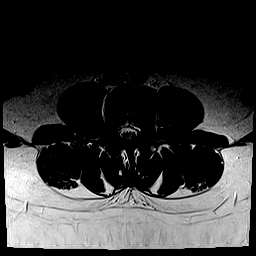
[im 23/34]
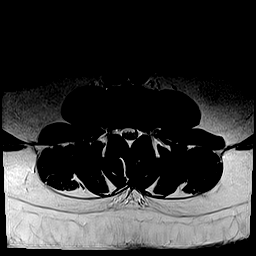
[im 26/34]
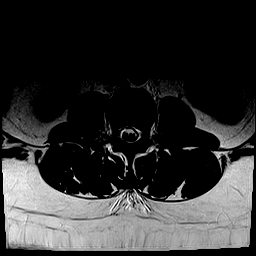
[im 28/34]
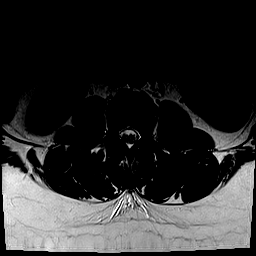
[im 31/34]
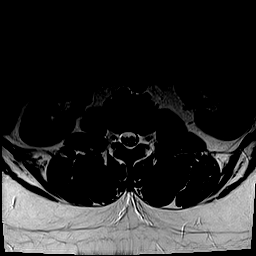
[im 34/34]
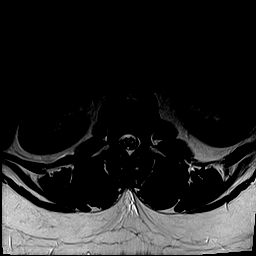

[Series 18: T1 · axial · 4.0mm · 0.78mm/px · z∈[-403,-213]mm · 14 of 34 slices shown (2 of 2)]
[im 1/34]
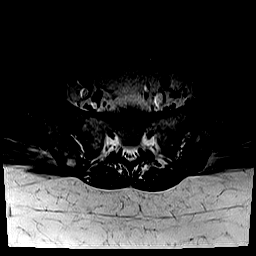
[im 3/34]
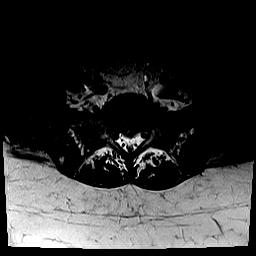
[im 6/34]
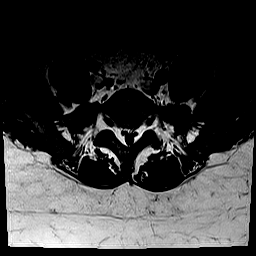
[im 8/34]
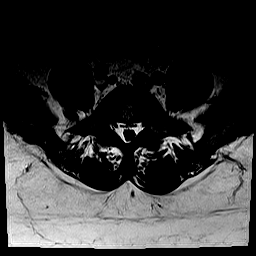
[im 11/34]
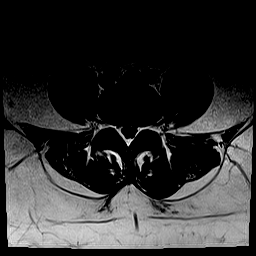
[im 13/34]
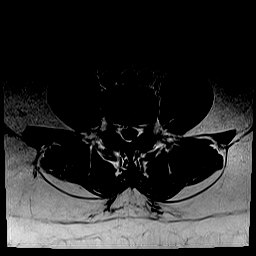
[im 16/34]
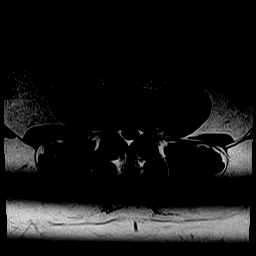
[im 18/34]
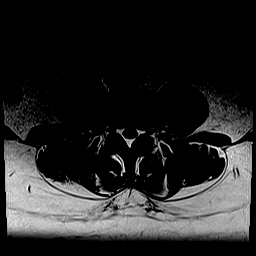
[im 21/34]
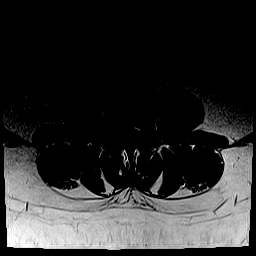
[im 23/34]
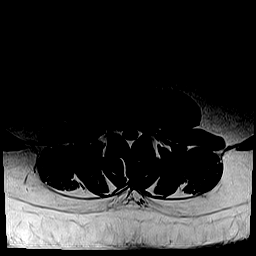
[im 26/34]
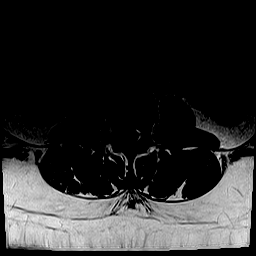
[im 28/34]
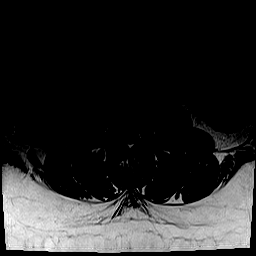
[im 31/34]
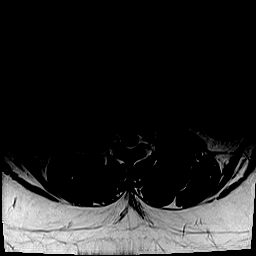
[im 34/34]
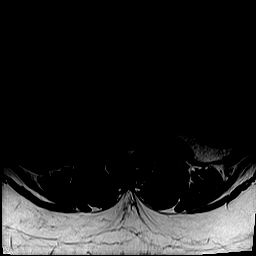

[Series 101: hx · axial · 10.0mm · 0.70mm/px · z∈[-91,+140]mm · 5 of 13 slices shown]
[im 1/13]
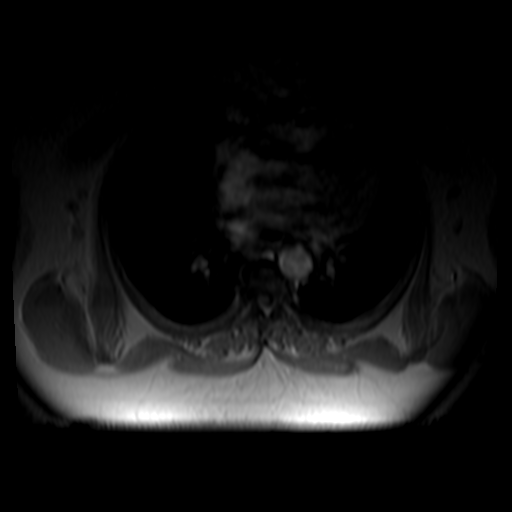
[im 4/13]
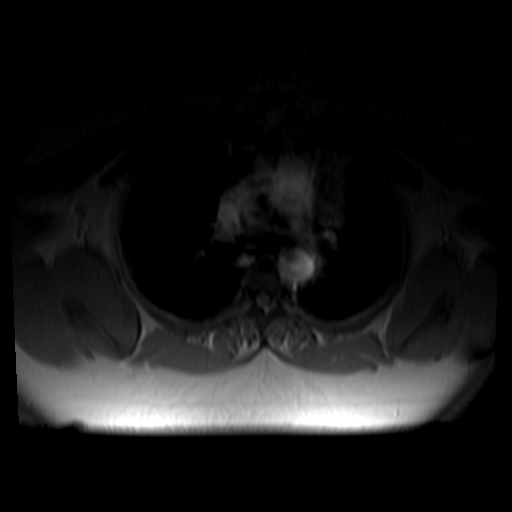
[im 7/13]
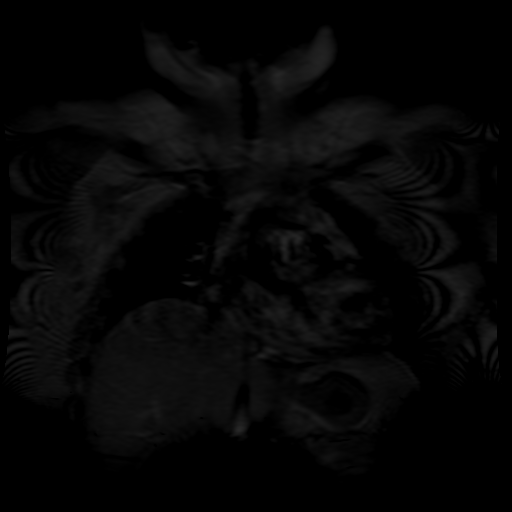
[im 10/13]
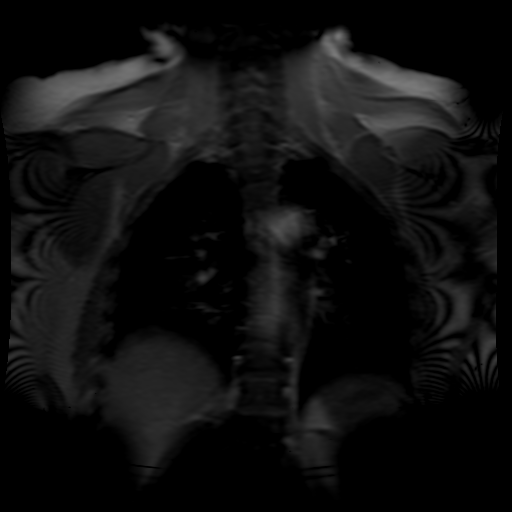
[im 13/13]
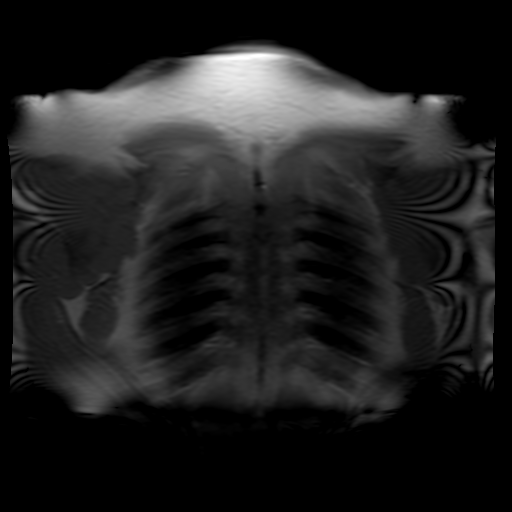

[48 of 48 positions shown; findings below may reference images not displayed]

FINDINGS: Segmentation: Transitional lumbosacral anatomy with partial
sacralization of L5.

Alignment:  Normal.

Vertebrae: No fracture, suspicious osseous lesion, or significant
marrow edema.

Conus medullaris and cauda equina: Conus extends to the upper L1
level. Conus and cauda equina appear normal.

Paraspinal and other soft tissues: Partially visualized T2
hyperintense structure in the right adnexa measuring greater than 4
cm in size likely corresponding to the 5 cm right ovarian cyst
demonstrated on recent ultrasound.

Disc levels:

L1-2: Negative.

L2-3: Disc desiccation. Minimal facet and ligamentum flavum
hypertrophy. No disc herniation or stenosis.

L3-4: Disc desiccation. Minimal facet and ligamentum flavum
hypertrophy. No disc herniation or stenosis.

L4-5: Normal disc. Minimal facet and ligamentum flavum hypertrophy
without stenosis.

L5-S1: Negative.
IMPRESSION: Largely unremarkable appearance of the lumbar spine. No disc
herniation or evidence of neural impingement.

## 2019-11-09 IMAGING — MR MR THORACIC SPINE W/O CM
5 of 9 series · 23 of 48 positions shown · non-contrast
Comparison: Thoracic spine radiographs [DATE]

CLINICAL DATA: Mid and low back pain for 6 months. Tingling in the
hands.

EXAM:
MRI THORACIC SPINE WITHOUT CONTRAST
TECHNIQUE: Multiplanar, multisequence MR imaging of the thoracic spine was
performed. No intravenous contrast was administered.

[Series 5: T1 · sagittal · 3.0mm · 1.25mm/px · 3 of 13 slices shown (1 of 2)]
[im 1/13]
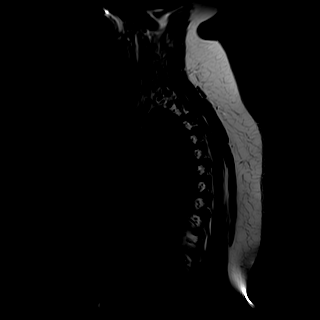
[im 7/13]
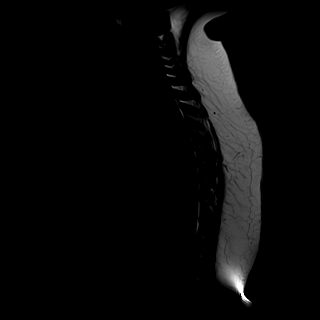
[im 13/13]
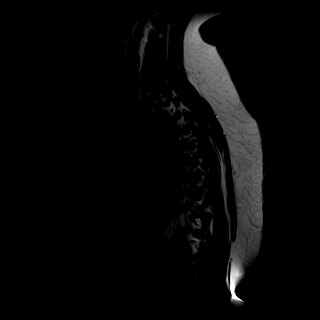

[Series 6: T2 · sagittal · 4.0mm · 1.00mm/px · 5 of 15 slices shown (1 of 3)]
[im 1/15]
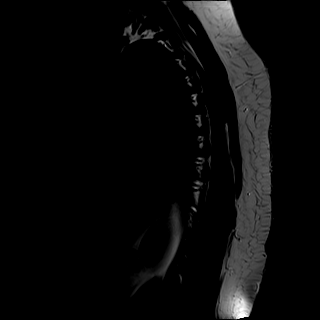
[im 4/15]
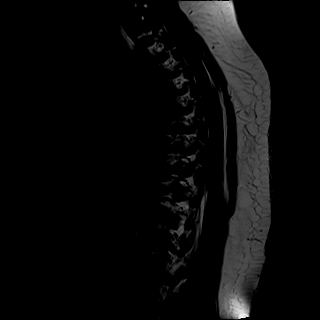
[im 8/15]
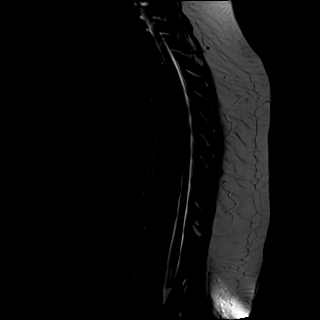
[im 11/15]
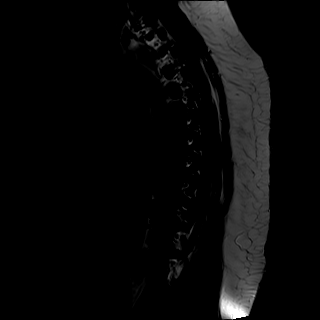
[im 15/15]
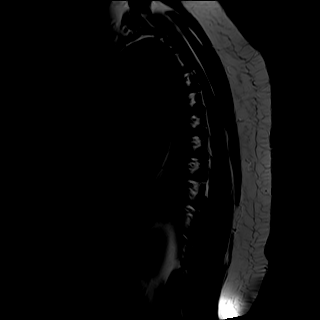

[Series 7: T1 · sagittal · 4.0mm · 0.52mm/px · 2 of 15 slices shown (2 of 2)]
[im 1/15]
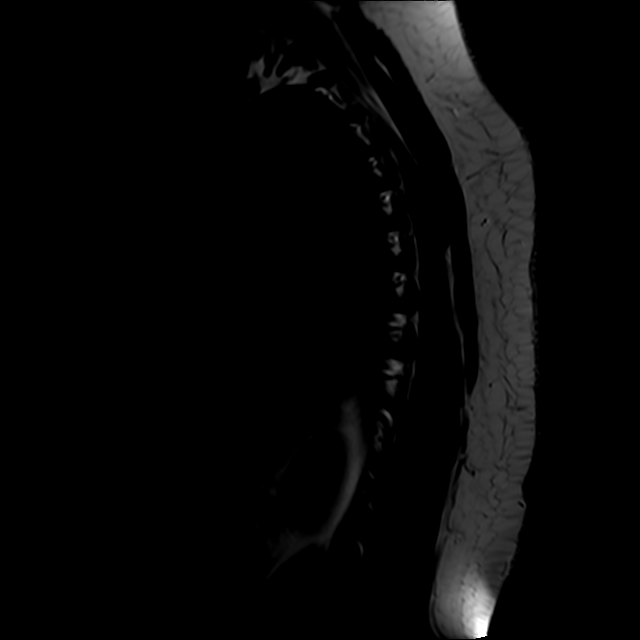
[im 4/15]
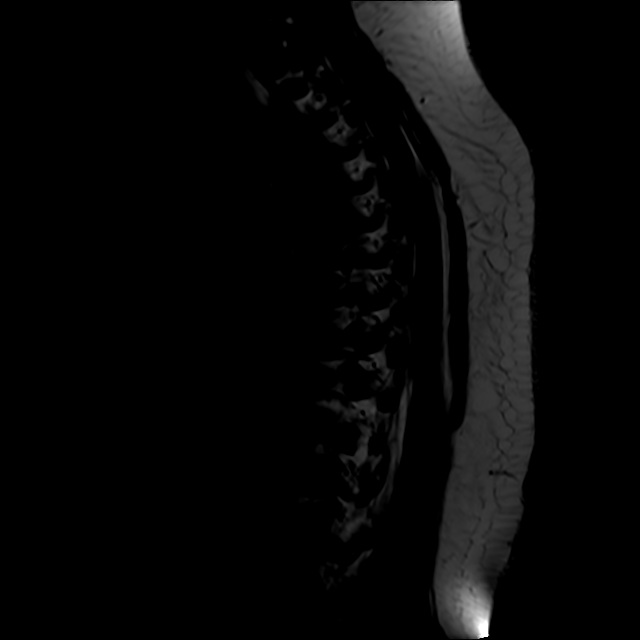

[Series 9: T2 · axial · 4.0mm · 0.41mm/px · z∈[-81,-9]mm · 7 of 21 slices shown (2 of 3)]
[im 1/21]
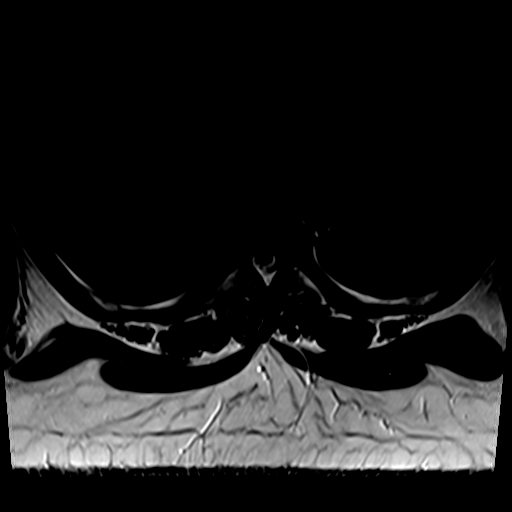
[im 4/21]
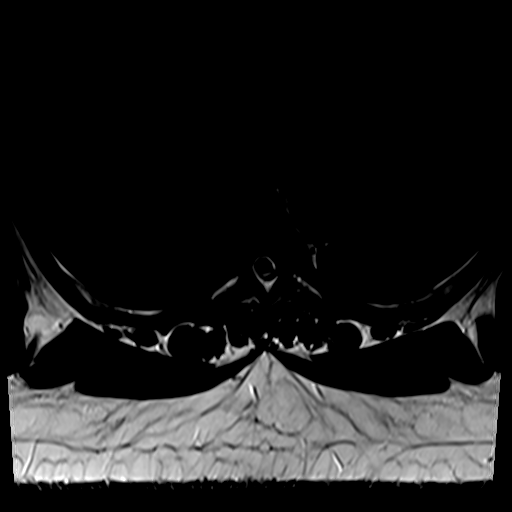
[im 7/21]
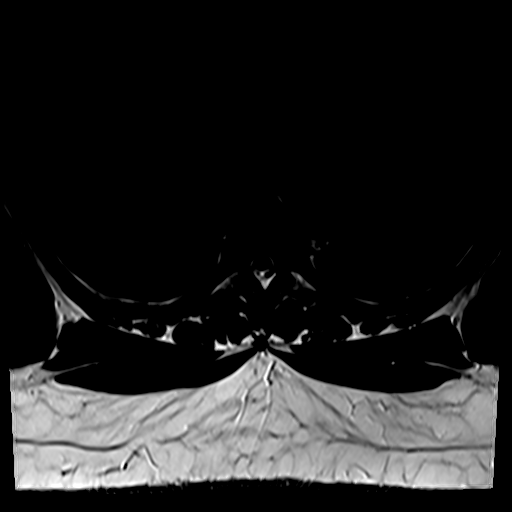
[im 11/21]
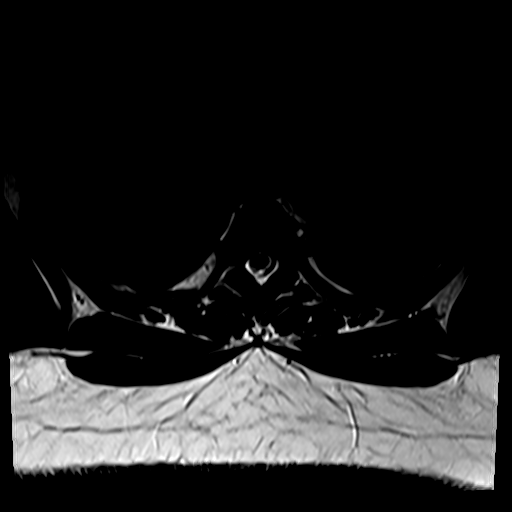
[im 14/21]
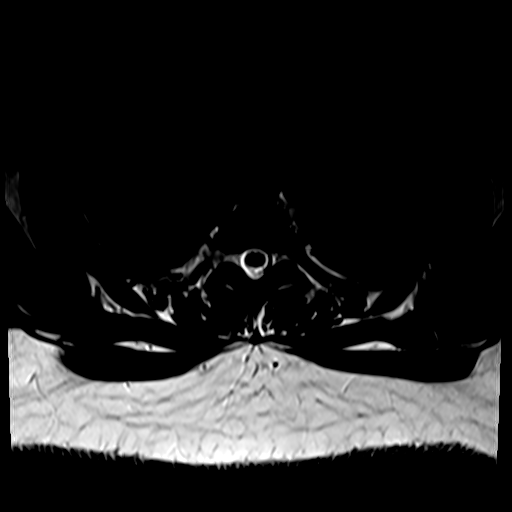
[im 17/21]
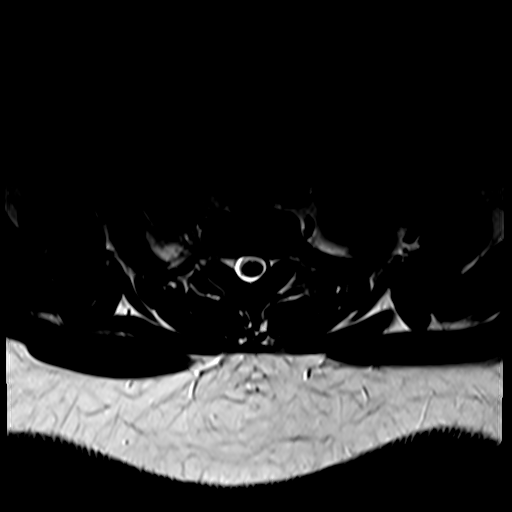
[im 21/21]
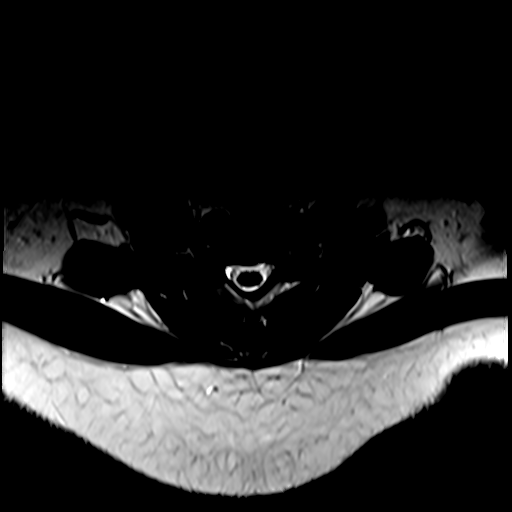

[Series 10: T2 · axial · 4.0mm · 0.41mm/px · z∈[-191,-75]mm · 6 of 18 slices shown (3 of 3)]
[im 1/18]
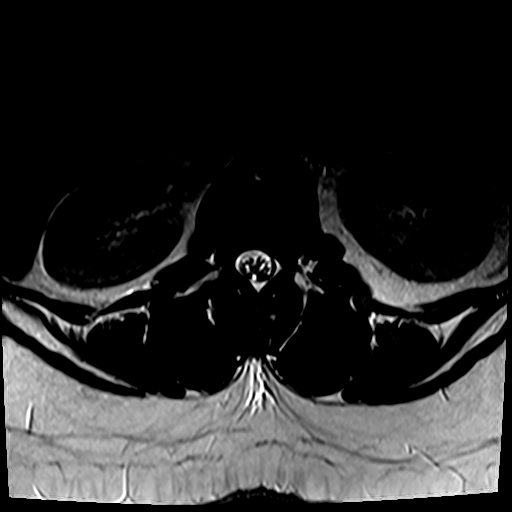
[im 4/18]
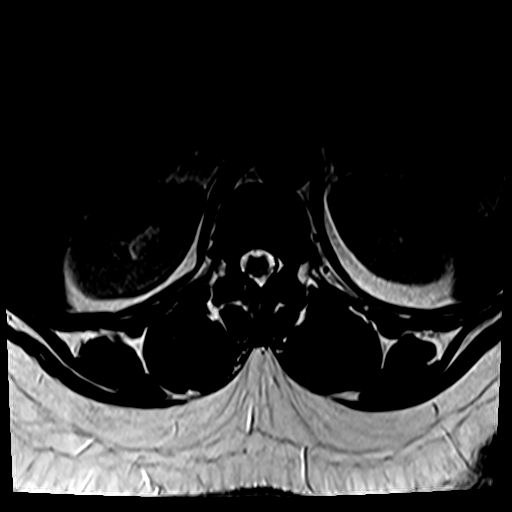
[im 7/18]
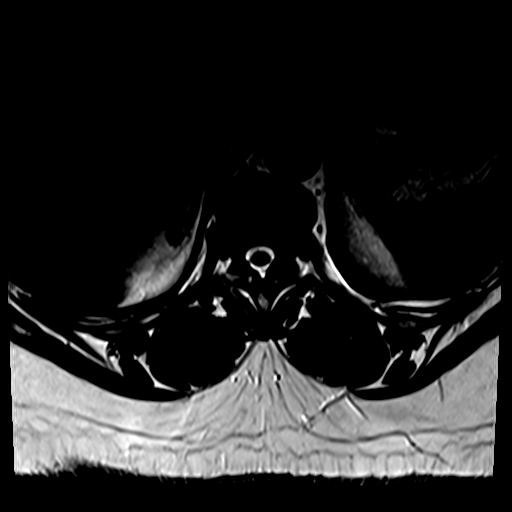
[im 11/18]
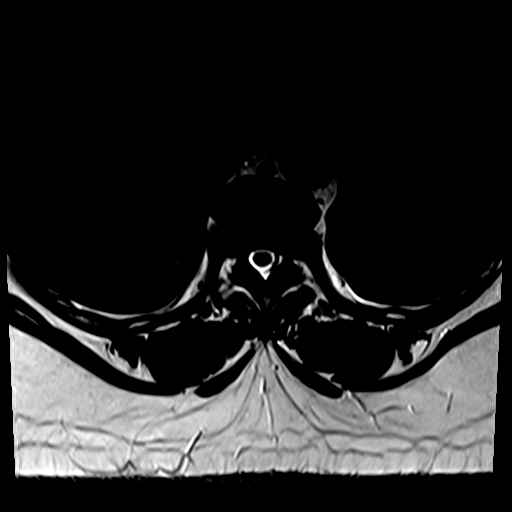
[im 14/18]
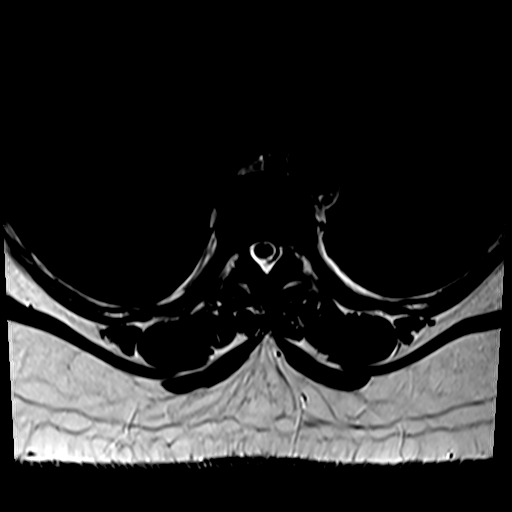
[im 18/18]
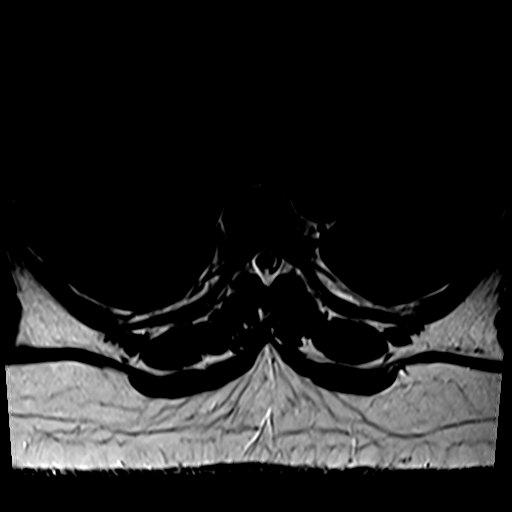

[23 of 48 positions shown; findings below may reference images not displayed]

FINDINGS: Alignment:  Normal.

Vertebrae: No fracture, suspicious osseous lesion, or significant
marrow edema.

Cord:  Normal signal and morphology.

Paraspinal and other soft tissues: Unremarkable.

Disc levels:

Disc desiccation throughout the mid to lower thoracic spine. Disc
bulging from T3-4 to T10-11, most notable from T4-5 to T7-8. No
evidence of significant spinal stenosis, neural foraminal stenosis,
or spinal cord mass effect.
IMPRESSION: Mild thoracic spondylosis without stenosis.

## 2019-11-12 ENCOUNTER — Other Ambulatory Visit: Payer: Self-pay | Admitting: Family Medicine

## 2019-11-12 ENCOUNTER — Other Ambulatory Visit: Payer: Self-pay

## 2019-11-12 ENCOUNTER — Ambulatory Visit (HOSPITAL_BASED_OUTPATIENT_CLINIC_OR_DEPARTMENT_OTHER)
Admission: RE | Admit: 2019-11-12 | Discharge: 2019-11-12 | Disposition: A | Discharge status: Home / Self Care | Payer: Self-pay | Source: Ambulatory Visit | Attending: Family Medicine | Admitting: Family Medicine

## 2019-11-12 DIAGNOSIS — R1900 Intra-abdominal and pelvic swelling, mass and lump, unspecified site: Secondary | ICD-10-CM

## 2019-11-12 DIAGNOSIS — R1013 Epigastric pain: Secondary | ICD-10-CM | POA: Insufficient documentation

## 2019-11-12 IMAGING — CT CT ABD-PELV W/ CM
2 of 5 series · 16 of 46 positions shown, 18 images · IV contrast (Omnipaque)
Comparison: [DATE].

CLINICAL DATA: Acute abdominal pain.

EXAM:
CT ABDOMEN AND PELVIS WITH CONTRAST
TECHNIQUE: Multidetector CT imaging of the abdomen and pelvis was performed
using the standard protocol following bolus administration of
intravenous contrast.
CONTRAST:  100mL OMNIPAQUE IOHEXOL 300 MG/ML  SOLN

[Series 2: axial st · axial · 0.90mm/px · z∈[-531,-146]mm · 13 of 89 slices shown, 15 images]
[im 6/89  soft-tissue]
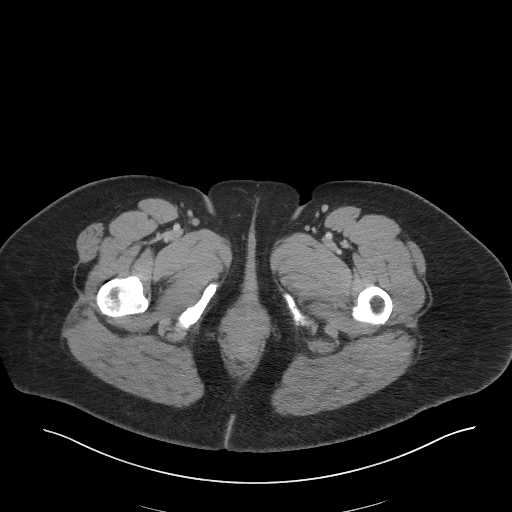
[im 6/89  bone]
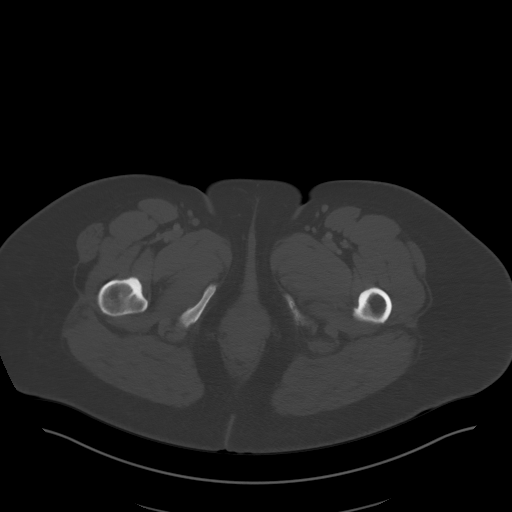
[im 11/89  soft-tissue]
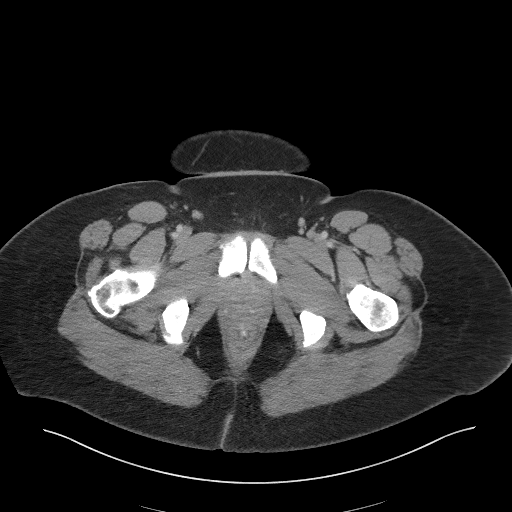
[im 21/89  soft-tissue]
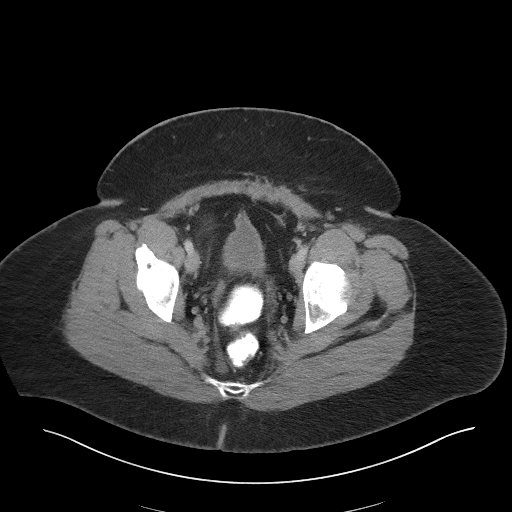
[im 26/89  soft-tissue]
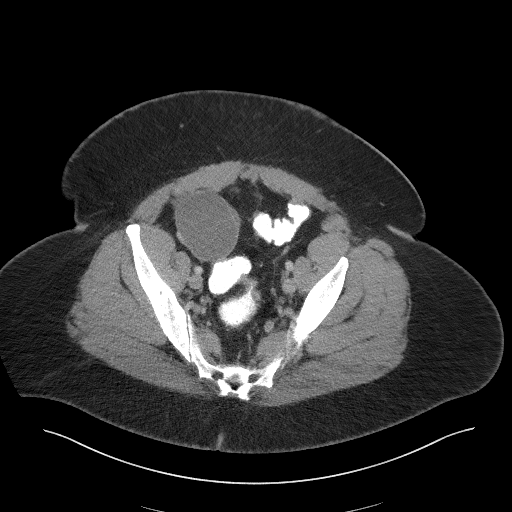
[im 32/89  soft-tissue]
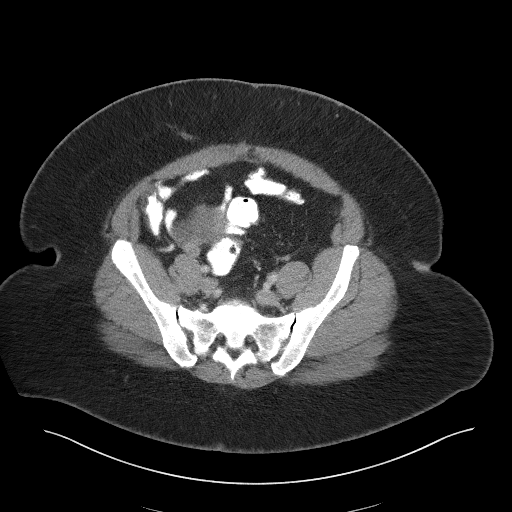
[im 37/89  soft-tissue]
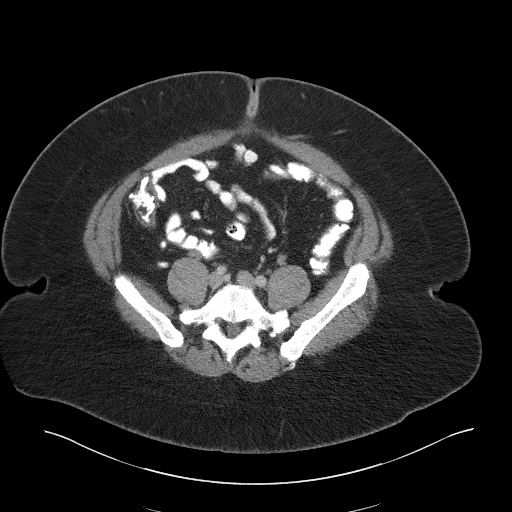
[im 47/89  soft-tissue]
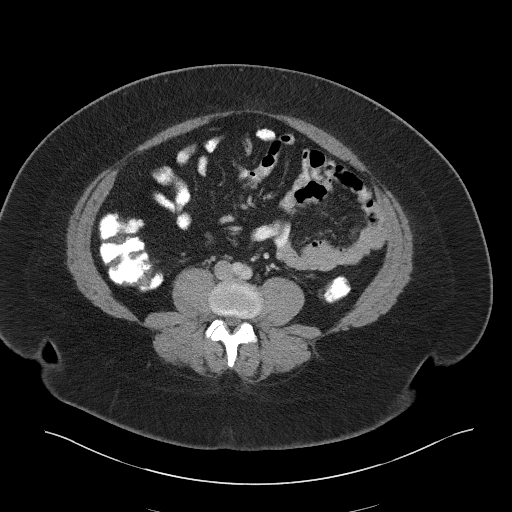
[im 52/89  soft-tissue]
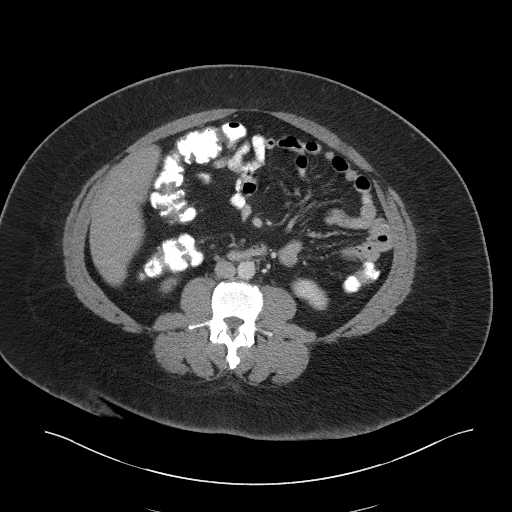
[im 57/89  soft-tissue]
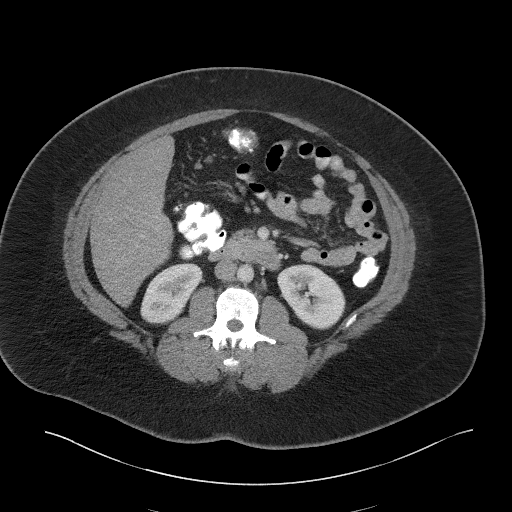
[im 57/89  bone]
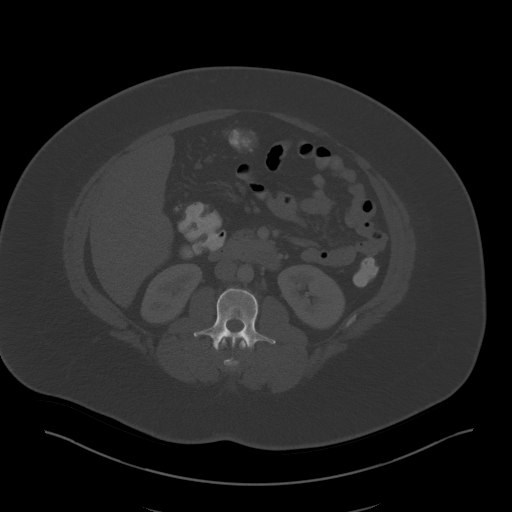
[im 63/89  soft-tissue]
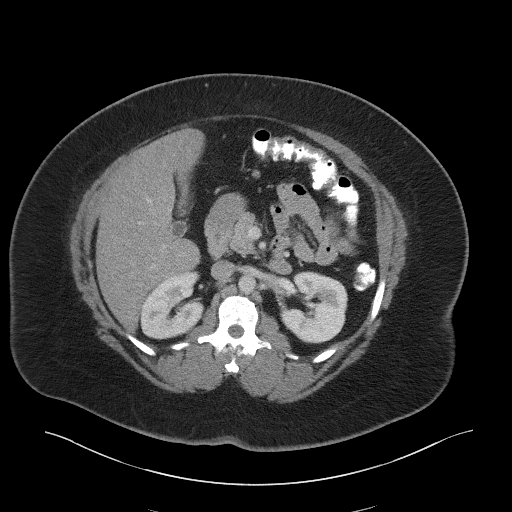
[im 68/89  soft-tissue]
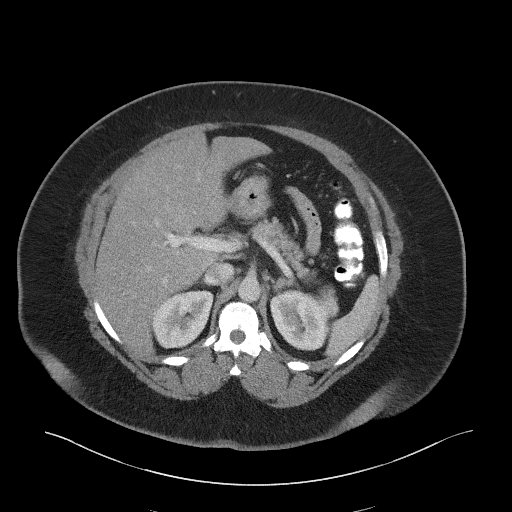
[im 78/89  soft-tissue]
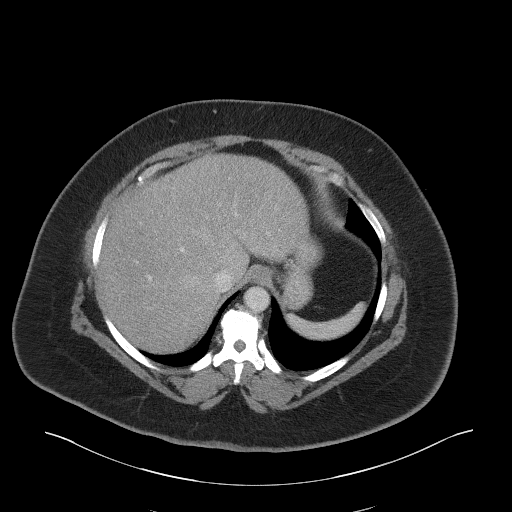
[im 83/89  soft-tissue]
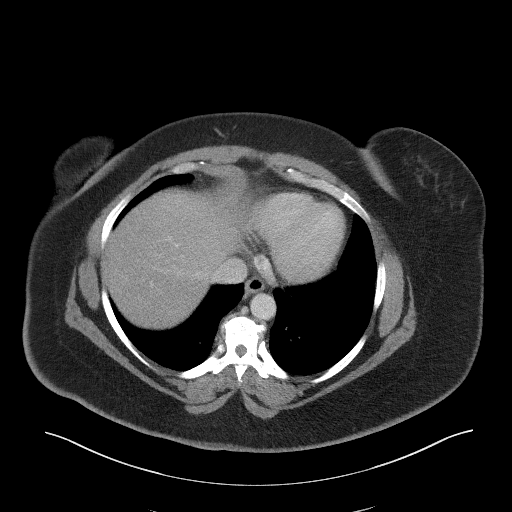

[Series 4: coronal st · coronal · 0.83mm/px · 3 of 119 slices shown]
[im 40/119  soft-tissue]
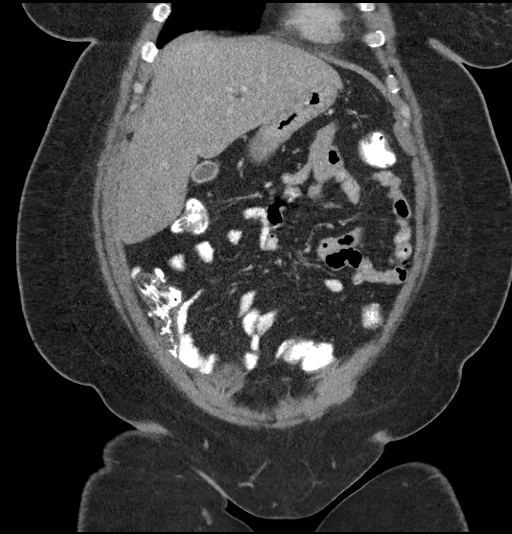
[im 53/119  soft-tissue]
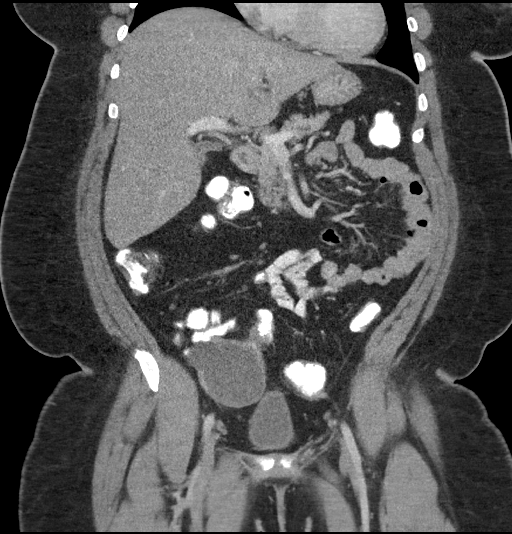
[im 66/119  soft-tissue]
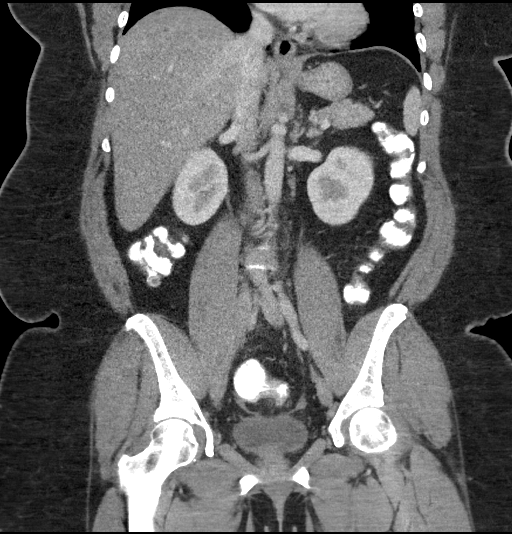

[16 of 46 positions shown; findings below may reference images not displayed]

FINDINGS: Lower chest: Clear lung bases.  Heart normal in size.

Hepatobiliary: Decreased attenuation of the liver consistent with
fatty infiltration. No mass or focal lesion. Liver normal in size.
No gallstones, gallbladder wall thickening, or biliary dilatation.

Pancreas: Unremarkable. No pancreatic ductal dilatation or
surrounding inflammatory changes.

Spleen: Normal in size without focal abnormality.

Adrenals/Urinary Tract: No adrenal masses.

Kidneys normal size, orientation and position. 3-4 mm
low-attenuation lesion in the midpole of the left kidney, too small
to characterize, most likely a cyst. No other renal masses or
lesions, no stones and no hydronephrosis. Normal ureters. Normal
bladder.

Stomach/Bowel: Stomach is within normal limits. Appendix appears
normal. No evidence of bowel wall thickening, distention, or
inflammatory changes.

Vascular/Lymphatic: No significant vascular findings are present. No
enlarged abdominal or pelvic lymph nodes.

Reproductive: Status post hysterectomy. Cystic mass in the right
adnexa measures 7.1 cm obliquely by 6.4 x 5.2 cm transversely,
increased in size from the prior CT where it measured 5.6 x 4.6 x
5.5 cm. No left adnexal mass or abnormality.

Other: Trace amount of pelvic free fluid lying along the posterior
right vaginal cuff to the right margin of the rectosigmoid, similar
to the prior CT.

Musculoskeletal: No acute or significant osseous findings.
IMPRESSION: 1. Cystic mass in the right pelvis, likely ovarian in origin,
measuring 7.1 cm in long axis, increased in size compared to the
prior CT. A cystic ovarian neoplasm should be considered. Mass could
be further assessed with transabdominal and endovaginal pelvic
ultrasound and/or pelvic MRI without and with contrast.
2. No acute findings within the abdomen or pelvis.
3. Hepatic steatosis.

## 2019-11-12 MED ORDER — IOHEXOL 300 MG/ML  SOLN
100.0000 mL | Freq: Once | INTRAMUSCULAR | Status: AC | PRN
  Administered 2019-11-12: 11:00:00 100 mL via INTRAVENOUS

## 2019-11-13 ENCOUNTER — Other Ambulatory Visit: Payer: Self-pay

## 2019-11-13 DIAGNOSIS — R1900 Intra-abdominal and pelvic swelling, mass and lump, unspecified site: Secondary | ICD-10-CM

## 2019-11-13 DIAGNOSIS — M5124 Other intervertebral disc displacement, thoracic region: Secondary | ICD-10-CM

## 2019-11-13 DIAGNOSIS — M5134 Other intervertebral disc degeneration, thoracic region: Secondary | ICD-10-CM

## 2019-11-16 ENCOUNTER — Other Ambulatory Visit: Payer: Self-pay

## 2019-11-16 ENCOUNTER — Other Ambulatory Visit: Payer: Self-pay | Admitting: Family Medicine

## 2019-11-16 ENCOUNTER — Ambulatory Visit (HOSPITAL_BASED_OUTPATIENT_CLINIC_OR_DEPARTMENT_OTHER)
Admission: RE | Admit: 2019-11-16 | Discharge: 2019-11-16 | Disposition: A | Discharge status: Home / Self Care | Payer: Self-pay | Source: Ambulatory Visit | Attending: Family Medicine | Admitting: Family Medicine

## 2019-11-16 DIAGNOSIS — R1900 Intra-abdominal and pelvic swelling, mass and lump, unspecified site: Secondary | ICD-10-CM | POA: Insufficient documentation

## 2019-11-16 DIAGNOSIS — N83209 Unspecified ovarian cyst, unspecified side: Secondary | ICD-10-CM

## 2019-11-16 IMAGING — MR MR PELVIS WO/W CM
5 of 10 series · 22 of 48 positions shown · IV contrast (10ML GADAVIST)
Comparison: CT scan [DATE].

CLINICAL DATA: Right adnexal cystic mass on CT.

EXAM:
MRI PELVIS WITHOUT AND WITH CONTRAST
TECHNIQUE: Multiplanar multisequence MR imaging of the pelvis was performed
both before and after administration of intravenous contrast.
CONTRAST:  10mL GADAVIST GADOBUTROL 1 MMOL/ML IV SOLN

[Series 2: T2 · coronal · 6.0mm · 1.25mm/px · 6 of 29 slices shown (1 of 3)]
[im 1/29]
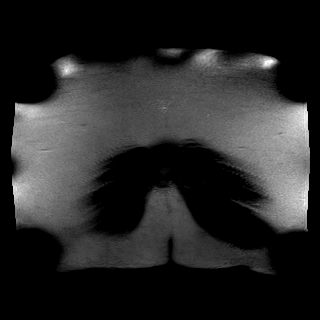
[im 6/29]
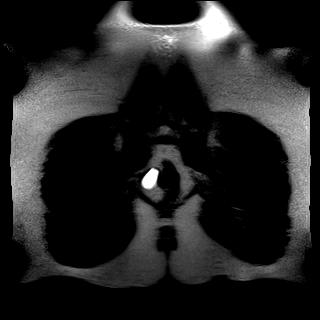
[im 12/29]
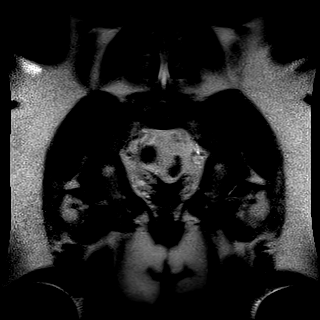
[im 17/29]
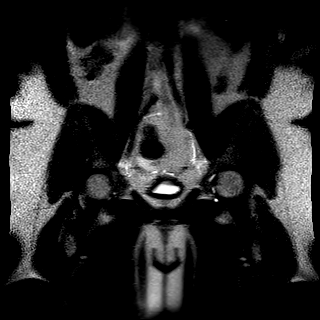
[im 23/29]
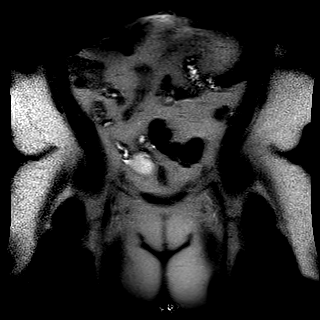
[im 29/29]
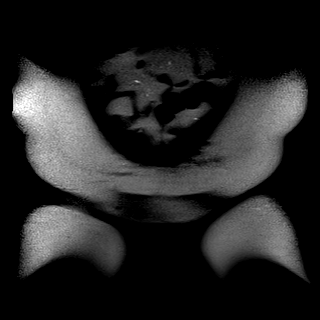

[Series 3: T2 · axial · 5.0mm · 0.44mm/px · z∈[-98,+64]mm · 5 of 28 slices shown (2 of 3)]
[im 1/28]
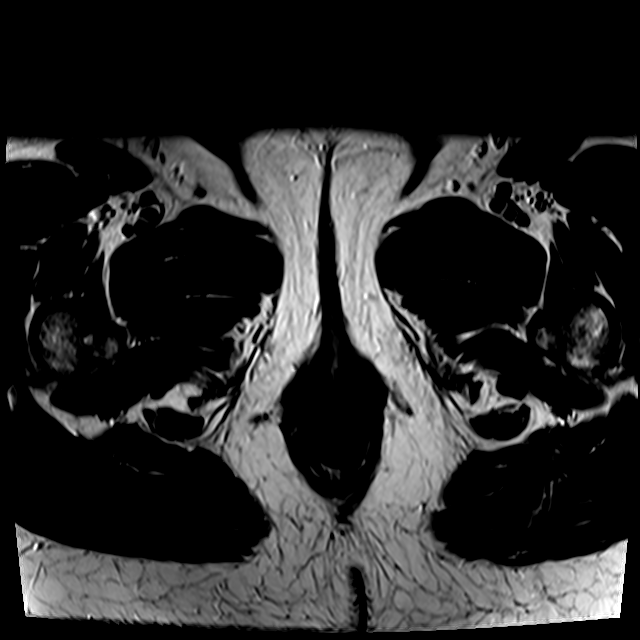
[im 7/28]
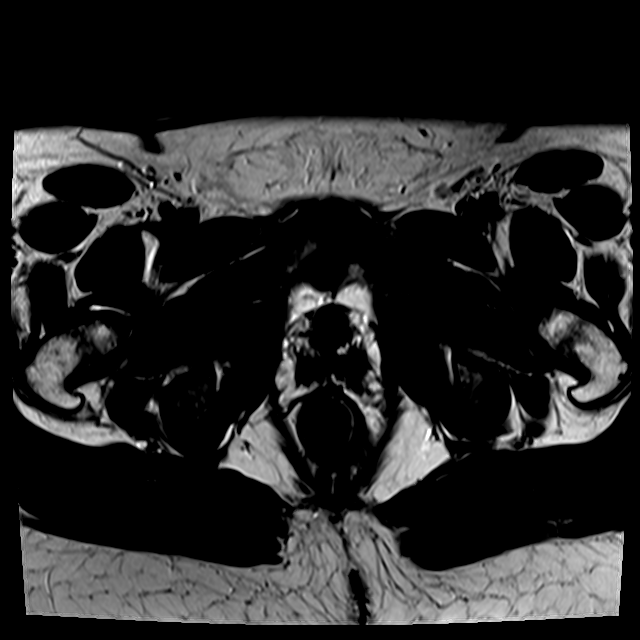
[im 14/28]
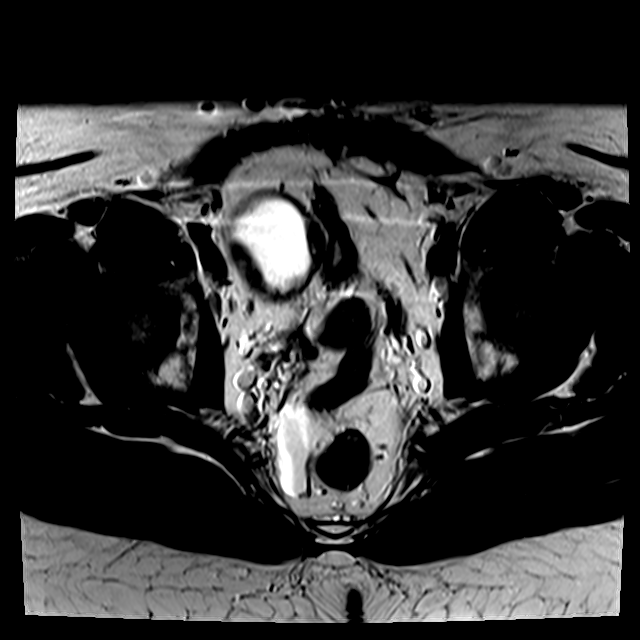
[im 21/28]
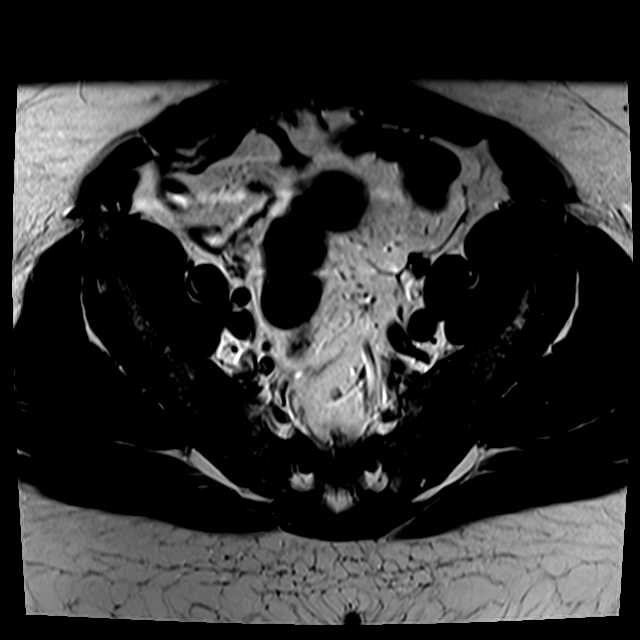
[im 28/28]
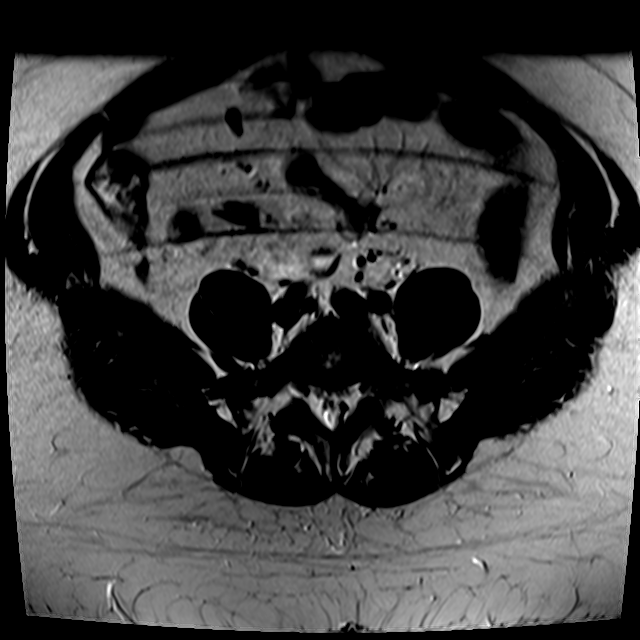

[Series 4: T2 fat-sat · axial · 5.0mm · 0.44mm/px · z∈[-89,+55]mm · 4 of 25 slices shown]
[im 1/25]
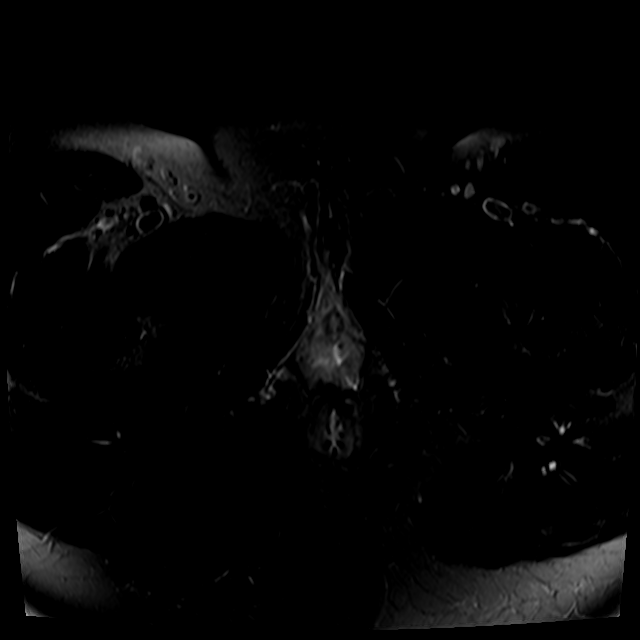
[im 9/25]
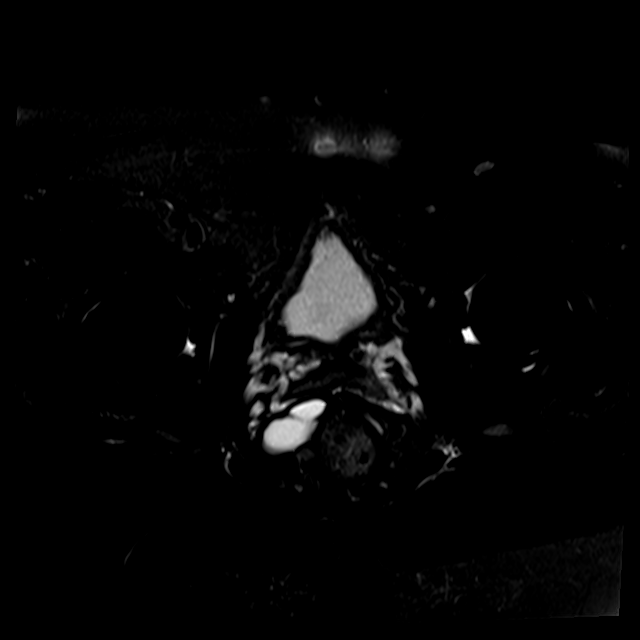
[im 17/25]
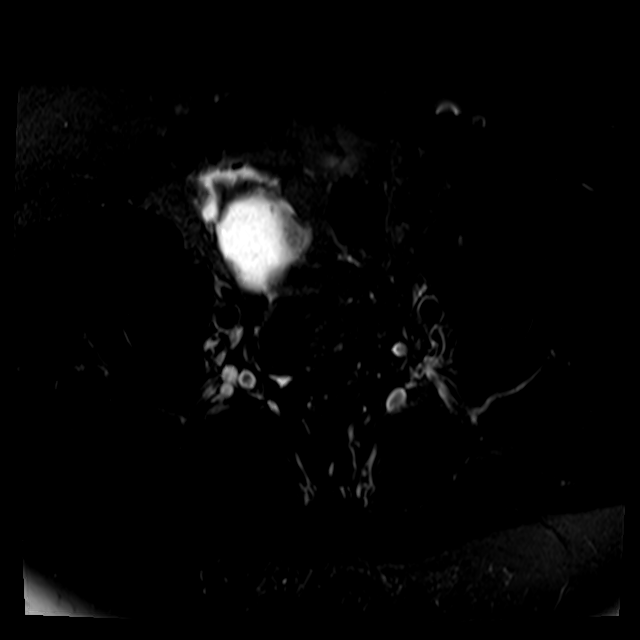
[im 25/25]
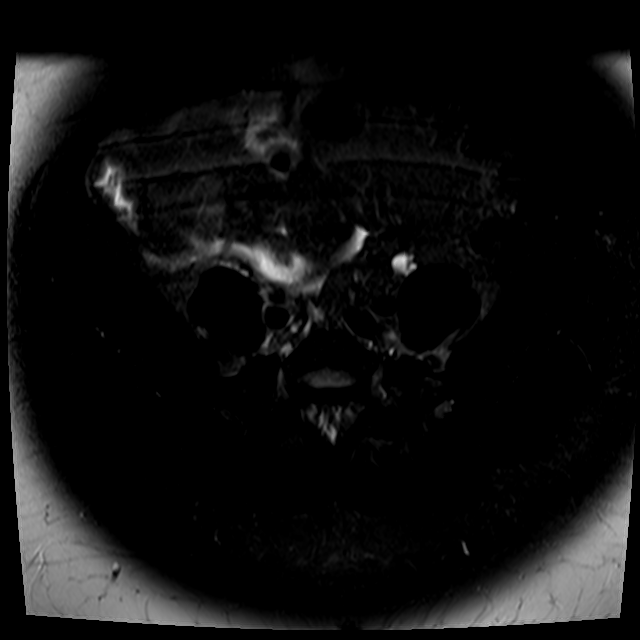

[Series 5: T2 · sagittal · 5.0mm · 0.88mm/px · 4 of 25 slices shown (3 of 3)]
[im 1/25]
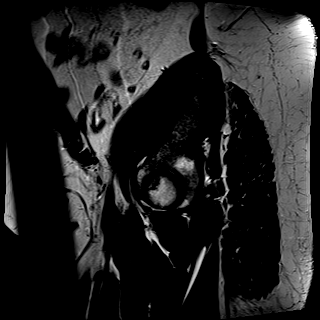
[im 9/25]
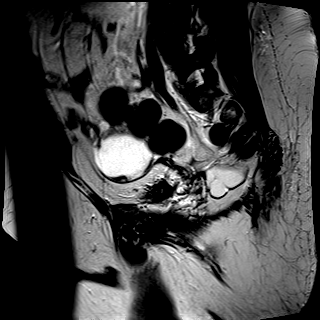
[im 17/25]
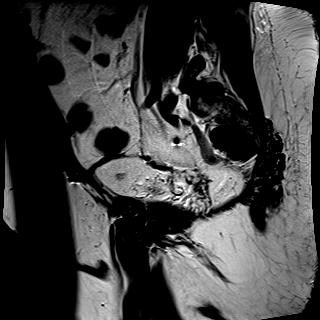
[im 25/25]
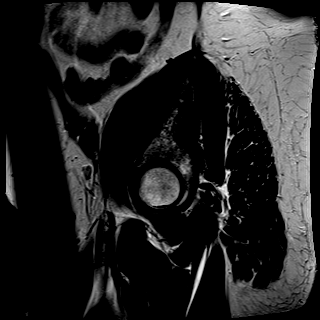

[Series 6: T1 · axial · 5.0mm · 0.88mm/px · z∈[-101,-17]mm · 3 of 30 slices shown]
[im 1/30]
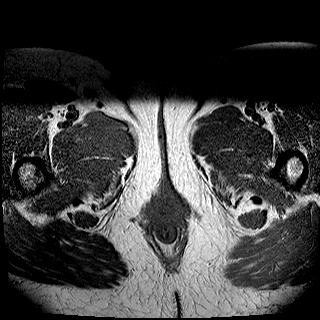
[im 8/30]
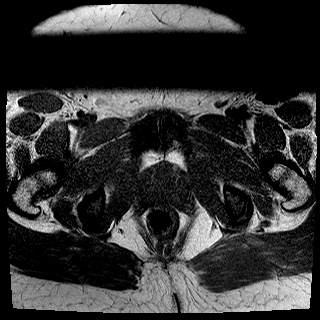
[im 15/30]
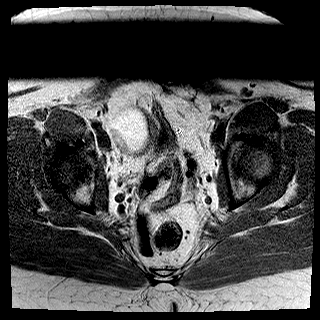

[22 of 48 positions shown; findings below may reference images not displayed]

[facility] pelvic ultrasound exam
[DATE]. [REDACTED] pelvic ultrasound [DATE]. [facility] CT stone study
[DATE].
FINDINGS: Urinary Tract:  Bladder is decompressed.  Urethra unremarkable.

Bowel: No small bowel or colonic dilatation within the visualized
pelvis.

Vascular/Lymphatic: Unremarkable.

Reproductive: Uterus surgically absent. Left ovary is not identified
and may be surgically absent. Left ovary was not visible on CT of
[DATE] and also not visualized on pelvic ultrasound of
[DATE]. 5.2 x 3.7 x 4.3 cm T1 hyperintense, T2 hyperintense cyst
is identified in the right ovary, thinning ovarian parenchyma
medially and inferiorly. There is no internal architecture in the
cyst and no mural nodularity or irregularity. No evidence for
enhancement of the cyst after IV contrast administration although a
thin rim of ovarian tissue medially does enhance. Gonadal vein can
be seen arising from the upper posterolateral margin of the cyst.

Scattered tiny foci of susceptibility artifact are seen in the
anterior abdominal wall compatible with low transverse incision and
in the central pelvis and just lateral to the right adnexal cyst,
compatible with sequelae of prior surgery.

Other: Small volume free fluid noted posterior right cul-de-sac.
This is stable since CT of [DATE] and was present but smaller on
a CT scan from [DATE].

Musculoskeletal: No abnormal marrow enhancement within the
visualized bony anatomy.
IMPRESSION: 1. 5.2 x 3.7 x 4.3 cm T1 hyperintense, T2 hyperintense nonenhancing
cyst is identified in the right ovary. No enhancing internal
architecture or evidence of mural nodule/papillary projection. This
cyst was identified by ultrasound on [DATE] and measured at
x 5.0 x 4.6 cm, consistent with no substantial change in the nearly
2 year interval since that study. Imaging on prior ultrasound exam
suggested hemorrhagic cyst and the T1 hyperintensity on MRI is also
compatible with proteinaceous/hemorrhagic contents. T2 signal
characteristics today are not typical for endometrioma.
2. Simple fluid collection in the right posterior cul-de-sac appears
loculated and is slightly larger than on a CT urogram of [DATE].

## 2019-11-16 MED ORDER — GADOBUTROL 1 MMOL/ML IV SOLN
10.0000 mL | Freq: Once | INTRAVENOUS | Status: AC | PRN
  Administered 2019-11-16: 13:00:00 10 mL via INTRAVENOUS

## 2019-11-24 ENCOUNTER — Other Ambulatory Visit: Payer: Self-pay | Admitting: Family Medicine

## 2019-11-24 DIAGNOSIS — I10 Essential (primary) hypertension: Secondary | ICD-10-CM

## 2019-12-03 ENCOUNTER — Ambulatory Visit (INDEPENDENT_AMBULATORY_CARE_PROVIDER_SITE_OTHER): Payer: Self-pay | Admitting: Orthopaedic Surgery

## 2019-12-03 ENCOUNTER — Other Ambulatory Visit: Payer: Self-pay

## 2019-12-03 ENCOUNTER — Encounter: Payer: Self-pay | Admitting: Orthopaedic Surgery

## 2019-12-03 ENCOUNTER — Ambulatory Visit (INDEPENDENT_AMBULATORY_CARE_PROVIDER_SITE_OTHER): Payer: Self-pay

## 2019-12-03 VITALS — BP 132/91 | HR 75 | Ht 62.0 in | Wt 230.0 lb

## 2019-12-03 DIAGNOSIS — M542 Cervicalgia: Secondary | ICD-10-CM

## 2019-12-03 DIAGNOSIS — M5124 Other intervertebral disc displacement, thoracic region: Secondary | ICD-10-CM

## 2019-12-03 DIAGNOSIS — M549 Dorsalgia, unspecified: Secondary | ICD-10-CM

## 2019-12-03 NOTE — Progress Notes (Signed)
Office Visit Note   Patient: Dana Patterson           Date of Birth: 12/25/1975           MRN: [id number] Visit Date: 12/03/2019              Requested by: [address], [place], [address] [place],  [place] [address] PCP: Carollee Herter, Alferd Apa, DO   Assessment & Plan: Visit Diagnoses:  1. Neck pain   2. Mid back pain   3. Protrusion of thoracic intervertebral disc     Plan:  Patient's husband is started a new job and will switch insurance as of March.  I recommend she try some physical therapy starting in March and she will return to see me in April.  We discussed the changes on her MRI scan thoracic spine we reviewed reports as well as images.  She does not have any thoracic compression.  We discussed weight loss to help unload her thoracic disks.  Some portion of her symptoms may be coming from cervical spondylosis.  Hopefully should get some improvement with therapy in March.  Recheck in April. Follow-Up Instructions: Return in about 3 months (around 03/02/2020).   Orders:  Orders Placed This Encounter  Procedures  . XR Cervical Spine 2 or 3 views  . Ambulatory referral to Physical Therapy   No orders of the defined types were placed in this encounter.     Procedures: No procedures performed   Clinical Data: No additional findings.   Subjective: Chief Complaint  Patient presents with  . Middle Back - Pain  . Lower Back - Pain    HPI 44 year old female seen with thoracic pain some lower cervical pain and also lumbar pain.  States she has had back pain for years she works for [employer] for several years stopped in April.  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  She denies myelopathic changes.  She has had previous imaging of the thoracic spine which showed multilevel minimal disc bulges from  T3-4 down to T10-11.  No areas of cord compression.  Patient's been on anti-inflammatories, switch to anti-inflammatories.  She has been on Flexeril also Robaxin without relief.  Mobic and Naprosyn.  Patient denies associated bowel or bladder symptoms.  Lumbar spine MRI was normal for her age.  She describes problems with outstretch reaching overhead activities and states primarily between the shoulder blades is where she has her maximum symptoms.  Review of Systems is system positive for morbid obesity BMI 42, hypertension, sleep apnea, thoracic disc bulges.  Otherwise negative is obtains HPI.   Objective: Vital Signs: BP (!) 132/91   Pulse 75   Ht 5\' 2"  (1.575 m)   Wt 230 lb (104.3 kg)   BMI 42.07 kg/m   Physical Exam Constitutional:      Appearance: She is well-developed.  HENT:     Head: Normocephalic.     Right Ear: External ear normal.     Left Ear: External ear normal.  Eyes:     Pupils: Pupils are equal, round, and reactive to light.  Neck:     Thyroid: No thyromegaly.     Trachea: No tracheal deviation.  Cardiovascular:     Rate and Rhythm: Normal rate.  Pulmonary:     Effort: Pulmonary  effort is normal.  Abdominal:     Palpations: Abdomen is soft.  Skin:    General: Skin is warm and dry.  Neurological:     Mental Status: She is alert and oriented to person, place, and time.  Psychiatric:        Behavior: Behavior normal.     Ortho Exam patient has right and left brachial plexus tenderness.  Mild to moderate positive Spurling right and left.  Upper extremity reflexes are 1+ and symmetrical.  No isolated motor weakness in the upper extremities.  Negative impingement of the shoulders.  She is able to heel and toe walk rapidly without problems.  She gets from sitting to standing.  Negative logroll to the hips.  No lower extremity clonus.  Specialty Comments:  No specialty comments available.  Imaging: XR Cervical Spine 2 or 3 views  Result Date: 12/03/2019 AP and  lateral cervical spine x-rays are obtained and reviewed.  This shows straightening of the cervical spine.  Endplate spurring without significant narrowing C4-5 C56.  Uncovertebral changes noted on AP x-ray. Impression: Mild mid cervical spondylosis.  Negative for acute changes.    PMFS History: Patient Active Problem List   Diagnosis Date Noted  . Protrusion of thoracic intervertebral disc 12/03/2019  . Morbid obesity (HCC) 01/14/2016  . Hypokalemia 01/14/2016  . Obstructive sleep apnea 01/14/2016  . Palpitations 01/14/2016  . Metabolic syndrome 01/14/2016  . Left hip pain 12/09/2015  . HTN (hypertension) 10/29/2015  . Allergic rhinitis 10/29/2015  . Pain in joint, shoulder region 10/29/2015  . Neck pain 10/29/2015   Past Medical History:  Diagnosis Date  . Allergy   . HTN (hypertension) 10/29/2015  . Hyperlipidemia   . Vaginal Pap smear, abnormal     Family History  Problem Relation Age of Onset  . Heart disease Mother   . Kidney disease Mother   . Hypertension Mother   . Hypertension Father     Past Surgical History:  Procedure Laterality Date  . ABDOMINAL HYSTERECTOMY     Fibroids per patient  . TUBAL LIGATION     Social History   Occupational History  . Not on file  Tobacco Use  . Smoking status: Never Smoker  . Smokeless tobacco: Never Used  Substance and Sexual Activity  . Alcohol use: No    Alcohol/week: 0.0 standard drinks  . Drug use: No  . Sexual activity: Not on file

## 2019-12-09 ENCOUNTER — Ambulatory Visit (INDEPENDENT_AMBULATORY_CARE_PROVIDER_SITE_OTHER): Payer: Self-pay | Admitting: Obstetrics & Gynecology

## 2019-12-09 ENCOUNTER — Other Ambulatory Visit: Payer: Self-pay

## 2019-12-09 ENCOUNTER — Encounter: Payer: Self-pay | Admitting: Obstetrics & Gynecology

## 2019-12-09 VITALS — BP 129/81 | HR 73 | Ht 62.0 in | Wt 241.0 lb

## 2019-12-09 DIAGNOSIS — N83201 Unspecified ovarian cyst, right side: Secondary | ICD-10-CM

## 2019-12-09 NOTE — Patient Instructions (Signed)
Ovarian Cyst     An ovarian cyst is a fluid-filled sac that forms on an ovary. The ovaries are small organs that produce eggs in women. Various types of cysts can form on the ovaries. Some may cause symptoms and require treatment. Most ovarian cysts go away on their own, are not cancerous (are benign), and do not cause problems. Common types of ovarian cysts include:  Functional (follicle) cysts. ? Occur during the menstrual cycle, and usually go away with the next menstrual cycle if you do not get pregnant. ? Usually cause no symptoms.  Endometriomas. ? Are cysts that form from the tissue that lines the uterus (endometrium). ? Are sometimes called "chocolate cysts" because they become filled with blood that turns Walls. ? Can cause pain in the lower abdomen during intercourse and during your period.  Cystadenoma cysts. ? Develop from cells on the outside surface of the ovary. ? Can get very large and cause lower abdomen pain and pain with intercourse. ? Can cause severe pain if they twist or break open (rupture).  Dermoid cysts. ? Are sometimes found in both ovaries. ? May contain different kinds of body tissue, such as skin, teeth, hair, or cartilage. ? Usually do not cause symptoms unless they get very big.  Theca lutein cysts. ? Occur when too much of a certain hormone (human chorionic gonadotropin) is produced and overstimulates the ovaries to produce an egg. ? Are most common after having procedures used to assist with the conception of a baby (in vitro fertilization). What are the causes? Ovarian cysts may be caused by:  Ovarian hyperstimulation syndrome. This is a condition that can develop from taking fertility medicines. It causes multiple large ovarian cysts to form.  Polycystic ovarian syndrome (PCOS). This is a common hormonal disorder that can cause ovarian cysts, as well as problems with your period or fertility. What increases the risk? The following factors may  make you more likely to develop ovarian cysts:  Being overweight or obese.  Taking fertility medicines.  Taking certain forms of hormonal birth control.  Smoking. What are the signs or symptoms? Many ovarian cysts do not cause symptoms. If symptoms are present, they may include:  Pelvic pain or pressure.  Pain in the lower abdomen.  Pain during sex.  Abdominal swelling.  Abnormal menstrual periods.  Increasing pain with menstrual periods. How is this diagnosed? These cysts are commonly found during a routine pelvic exam. You may have tests to find out more about the cyst, such as:  Ultrasound.  X-ray of the pelvis.  CT scan.  MRI.  Blood tests. How is this treated? Many ovarian cysts go away on their own without treatment. Your health care provider may want to check your cyst regularly for 2-3 months to see if it changes. If you are in menopause, it is especially important to have your cyst monitored closely because menopausal women have a higher rate of ovarian cancer. When treatment is needed, it may include:  Medicines to help relieve pain.  A procedure to drain the cyst (aspiration).  Surgery to remove the whole cyst.  Hormone treatment or birth control pills. These methods are sometimes used to help dissolve a cyst. Follow these instructions at home:  Take over-the-counter and prescription medicines only as told by your health care provider.  Do not drive or use heavy machinery while taking prescription pain medicine.  Get regular pelvic exams and Pap tests as often as told by your health care provider.    Return to your normal activities as told by your health care provider. Ask your health care provider what activities are safe for you.  Do not use any products that contain nicotine or tobacco, such as cigarettes and e-cigarettes. If you need help quitting, ask your health care provider.  Keep all follow-up visits as told by your health care provider.  This is important. Contact a health care provider if:  Your periods are late, irregular, or painful, or they stop.  You have pelvic pain that does not go away.  You have pressure on your bladder or trouble emptying your bladder completely.  You have pain during sex.  You have any of the following in your abdomen: ? A feeling of fullness. ? Pressure. ? Discomfort. ? Pain that does not go away. ? Swelling.  You feel generally ill.  You become constipated.  You lose your appetite.  You develop severe acne.  You start to have more body hair and facial hair.  You are gaining weight or losing weight without changing your exercise and eating habits.  You think you may be pregnant. Get help right away if:  You have abdominal pain that is severe or gets worse.  You cannot eat or drink without vomiting.  You suddenly develop a fever.  Your menstrual period is much heavier than usual. This information is not intended to replace advice given to you by your health care provider. Make sure you discuss any questions you have with your health care provider. Document Revised: 02/05/2018 Document Reviewed: 04/10/2016 Elsevier Patient Education  2020 Elsevier Inc.  

## 2019-12-09 NOTE — Progress Notes (Signed)
Subjective:     Dana Patterson is a 44 y.o. female here for f/u of right adnexal cyst. Pt has cyst on right ovary that was initially noted incidentally. This was followed for 1 year. Pt primary care provider noted the cyst findings and ordered CT and MRI for further eval. Pt is here for results. She denies pain in that area. She reports that occ she has a sensation on the right side but, is not sure if its related to the cyst.     Pt denies FH of any female GYN malignancies.   Gynecologic History No LMP recorded. Patient has had a hysterectomy. Contraception: status post hysterectomy Last mammogram: 02/09/2016. Results were: normal  Obstetric History OB History  Gravida Para Term Preterm AB Living  6 4 4   2     SAB TAB Ectopic Multiple Live Births  2       4    # Outcome Date GA Lbr Len/2nd Weight Sex Delivery Anes PTL Lv  6 SAB           5 SAB           4 Term      Vag-Spont     3 Term           2 Term           1 Term             Obstetric Comments  2 pregnancies after Tubal Lig, both miscarriages per patient   The following portions of the patient's history were reviewed and updated as appropriate: allergies, current medications, past family history, past medical history, past social history, past surgical history and problem list.  Review of Systems Pertinent items are noted in HPI.    Objective:  BP 129/81   Pulse 73   Ht 5\' 2"  (1.575 m)   Wt 241 lb (109.3 kg)   BMI 44.08 kg/m   CONSTITUTIONAL: Well-developed, well-nourished female in no acute distress.  HENT:  Normocephalic, atraumatic EYES: Conjunctivae and EOM are normal. No scleral icterus.  NECK: Normal range of motion SKIN: Skin is warm and dry. No rash noted. Not diaphoretic.No pallor. NEUROLGIC: Alert and oriented to person, place, and time. Normal coordination.  Abd: obese, NT, ND GU: EGBUS: no lesions Vagina: no blood in vault Cervix/uterus: surgically absent Adnexa: no masses; non tender. Exam  limited by pts body habitus.   03/31/2018 CLINICAL DATA:  Follow-up right ovarian cyst  EXAM: ULTRASOUND PELVIS TRANSVAGINAL  TECHNIQUE: Transvaginal ultrasound examination of the pelvis was performed including evaluation of the uterus, ovaries, adnexal regions, and pelvic cul-de-sac.  COMPARISON:  02/26/2018  FINDINGS: Uterus:  Surgically removed  Right ovary  Measurements: 4.2 x 2.5 x 3.2 cm. 2.7 cm cyst is again noted within the right ovary. This demonstrates some internal echoes and has decreased in size significantly from the prior exam a which time it measured 4.7 cm in greatest dimension.  Left ovary  Not visualized  Other findings:  No abnormal free fluid  IMPRESSION: Status post hysterectomy.  Reduction in size of the previously seen complex cyst consistent with a resolving hemorrhagic cyst. The need for further follow-up can be determined on a clinical basis.  11/05/2019 CLINICAL DATA:  Upper abdominal pain  EXAM: ABDOMEN ULTRASOUND COMPLETE  COMPARISON:  11/24/2016  FINDINGS: Gallbladder: Normally distended without stones or wall thickening. No pericholecystic fluid or sonographic Murphy sign.  Common bile duct: Diameter: 5 mm, normal  Liver: Echogenic parenchyma, likely fatty  infiltration though this can be seen with cirrhosis and certain infiltrative disorders. No gross hepatic mass or nodularity identified though sound transmission through the liver is suboptimal and intrahepatic pathology is not excluded by this study. Portal vein patent with normal direction of blood flow towards the liver  IVC: Normal appearance  Pancreas: Normal appearance  Spleen: Normal appearance, 8.1 cm length  Right Kidney: Length: 10.7 cm. Normal morphology without mass or hydronephrosis.  Left Kidney: Length: 11.0 cm. Normal morphology without mass or hydronephrosis.  Abdominal aorta: Normal caliber  Other findings: No free  fluid  IMPRESSION: Probable fatty infiltration of liver as above.  Suboptimal assessment of intrahepatic detail due to sound attenuation, no gross abnormality identified though cannot exclude intrahepatic pathology by this study; if better intrahepatic visualization is required recommend MR or CT.  Remainder of exam unremarkable.  11/12/2019 CLINICAL DATA:  Acute abdominal pain.  EXAM: CT ABDOMEN AND PELVIS WITH CONTRAST  TECHNIQUE: Multidetector CT imaging of the abdomen and pelvis was performed using the standard protocol following bolus administration of intravenous contrast.  CONTRAST:  OMNIPAQUE IOHEXOL 300 MG/ML  SOLN  COMPARISON:  02/19/2018.  FINDINGS: Lower chest: Clear lung bases.  Heart normal in size.  Hepatobiliary: Decreased attenuation of the liver consistent with fatty infiltration. No mass or focal lesion. Liver normal in size. No gallstones, gallbladder wall thickening, or biliary dilatation.  Pancreas: Unremarkable. No pancreatic ductal dilatation or surrounding inflammatory changes.  Spleen: Normal in size without focal abnormality.  Adrenals/Urinary Tract: No adrenal masses.  Kidneys normal size, orientation and position. 3-4 mm low-attenuation lesion in the midpole of the left kidney, too small to characterize, most likely a cyst. No other renal masses or lesions, no stones and no hydronephrosis. Normal ureters. Normal bladder.  Stomach/Bowel: Stomach is within normal limits. Appendix appears normal. No evidence of bowel wall thickening, distention, or inflammatory changes.  Vascular/Lymphatic: No significant vascular findings are present. No enlarged abdominal or pelvic lymph nodes.  Reproductive: Status post hysterectomy. Cystic mass in the right adnexa measures 7.1 cm obliquely by 6.4 x 5.2 cm transversely, increased in size from the prior CT where it measured 5.6 x 4.6 x 5.5 cm. No left adnexal mass or  abnormality.  Other: Trace amount of pelvic free fluid lying along the posterior right vaginal cuff to the right margin of the rectosigmoid, similar to the prior CT.  Musculoskeletal: No acute or significant osseous findings.  IMPRESSION: 1. Cystic mass in the right pelvis, likely ovarian in origin, measuring 7.1 cm in long axis, increased in size compared to the prior CT. A cystic ovarian neoplasm should be considered. Mass could be further assessed with transabdominal and endovaginal pelvic ultrasound and/or pelvic MRI without and with contrast. 2. No acute findings within the abdomen or pelvis. 3. Hepatic steatosis.   11/16/2019 CLINICAL DATA:  Right adnexal cystic mass on CT.  EXAM: MRI PELVIS WITHOUT AND WITH CONTRAST  TECHNIQUE: Multiplanar multisequence MR imaging of the pelvis was performed both before and after administration of intravenous contrast.  CONTRAST:  70mL GADAVIST GADOBUTROL 1 MMOL/ML IV SOLN  COMPARISON:  CT scan 11/12/2019. [facility] pelvic ultrasound exam 11/05/2019. [facility] pelvic ultrasound 02/19/2018. [facility] CT stone study 11/25/2016.  FINDINGS: Urinary Tract:  Bladder is decompressed.  Urethra unremarkable.  Bowel: No small bowel or colonic dilatation within the visualized pelvis.  Vascular/Lymphatic: Unremarkable.  Reproductive: Uterus surgically absent. Left ovary is not identified and may be surgically absent. Left ovary was not visible on CT of 11/12/2019  and also not visualized on pelvic ultrasound of 11/05/2019. 5.2 x 3.7 x 4.3 cm T1 hyperintense, T2 hyperintense cyst is identified in the right ovary, thinning ovarian parenchyma medially and inferiorly. There is no internal architecture in the cyst and no mural nodularity or irregularity. No evidence for enhancement of the cyst after IV contrast administration although a thin rim of ovarian tissue medially does enhance. Gonadal vein can be seen arising from the upper posterolateral  margin of the cyst.  Scattered tiny foci of susceptibility artifact are seen in the anterior abdominal wall compatible with low transverse incision and in the central pelvis and just lateral to the right adnexal cyst, compatible with sequelae of prior surgery.  Other: Small volume free fluid noted posterior right cul-de-sac. This is stable since CT of 11/12/2019 and was present but smaller on a CT scan from 11/25/2016.  Musculoskeletal: No abnormal marrow enhancement within the visualized bony anatomy.  IMPRESSION: 1. 5.2 x 3.7 x 4.3 cm T1 hyperintense, T2 hyperintense nonenhancing cyst is identified in the right ovary. No enhancing internal architecture or evidence of mural nodule/papillary projection. This cyst was identified by ultrasound on 02/19/2018 and measured at 5.6 x 5.0 x 4.6 cm, consistent with no substantial change in the nearly 2 year interval since that study. Imaging on prior ultrasound exam suggested hemorrhagic cyst and the T1 hyperintensity on MRI is also compatible with proteinaceous/hemorrhagic contents. T2 signal characteristics today are not typical for endometrioma. 2. Simple fluid collection in the right posterior cul-de-sac appears loculated and is slightly larger than on a CT urogram of 11/24/2016.  Assessment:  Right ov cyst- favor benign hemorrhagic cyst. Asymptomatic. I have reviewed all of the images including the [place], CT and MRI of the pelvis and the CA125. All suggest a benign process.  Breast cancer screen      Plan:  Rec f/u in 3 months  Pt needs screening mammogram- mammogram grant completed.  Reviewed imaging with pt.   Reviewed the etiology of benign cysts. Reviewed surgical management vs expectant management  Pt opts for expectant management.   Total face-to-face time with patient was 20 min.  Greater than 50% was spent in counseling and coordination of care with the patient.   Danyel Tobey L. Harraway-Smith, M.D., [facility]lynn June

## 2019-12-30 ENCOUNTER — Encounter: Payer: Self-pay | Admitting: Obstetrics & Gynecology

## 2020-01-16 ENCOUNTER — Other Ambulatory Visit: Payer: Self-pay | Admitting: Family Medicine

## 2020-01-16 DIAGNOSIS — Z889 Allergy status to unspecified drugs, medicaments and biological substances status: Secondary | ICD-10-CM

## 2020-01-30 ENCOUNTER — Ambulatory Visit: Payer: BC Managed Care – PPO | Attending: Orthopaedic Surgery | Admitting: Physical Therapy

## 2020-01-30 ENCOUNTER — Other Ambulatory Visit: Payer: Self-pay

## 2020-01-30 DIAGNOSIS — M5413 Radiculopathy, cervicothoracic region: Secondary | ICD-10-CM

## 2020-01-30 DIAGNOSIS — R293 Abnormal posture: Secondary | ICD-10-CM | POA: Diagnosis present

## 2020-01-30 DIAGNOSIS — M546 Pain in thoracic spine: Secondary | ICD-10-CM | POA: Diagnosis present

## 2020-01-30 DIAGNOSIS — G8929 Other chronic pain: Secondary | ICD-10-CM

## 2020-01-30 DIAGNOSIS — M545 Low back pain: Secondary | ICD-10-CM | POA: Insufficient documentation

## 2020-01-30 DIAGNOSIS — M62830 Muscle spasm of back: Secondary | ICD-10-CM

## 2020-01-30 DIAGNOSIS — M6281 Muscle weakness (generalized): Secondary | ICD-10-CM | POA: Insufficient documentation

## 2020-01-30 NOTE — Therapy (Signed)
[facility] [address]  [address] [place], [place], [address] Phone: [phone]   Fax:  [phone]  Physical Therapy Evaluation  Patient Details  Name: Dana Patterson MRN: [id number] Date of Birth: Jul 02, 1976 Referring Provider (PT): Eldred Manges, MD   Encounter Date: 01/30/2020  PT End of Session - 01/30/20 0931    Visit Number  1    Number of Visits  8    Date for PT Re-Evaluation  03/26/20    Authorization Type  BCBS    Authorization - Number of Visits  --   VL = 90   PT Start Time  0931    PT Stop Time  1023    PT Time Calculation (min)  52 min    Activity Tolerance  Patient tolerated treatment well    Behavior During Therapy  [facility] for tasks assessed/performed       Past Medical History:  Diagnosis Date  . Allergy   . HTN (hypertension) 10/29/2015  . Hyperlipidemia   . Vaginal Pap smear, abnormal     Past Surgical History:  Procedure Laterality Date  . ABDOMINAL HYSTERECTOMY     Fibroids per patient  . TUBAL LIGATION      There were no vitals filed for this visit.   Subjective Assessment - 01/30/20 0935    Subjective  Pt c/o for a long time of pain in upper back esp with raising her arms. States she was told she had a bulging disc at ?T4. 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    Limitations  Standing;Walking;House hold activities;Lifting    How long can you stand comfortably?  1 hr    How long can you walk comfortably?  30 minutes    Diagnostic tests  Cervical x-ray 12/03/19: Mild mid cervical spondylosis. Negative for acute changes.  Thoracic & lumbar MRIs 11/09/19:Disc desiccation throughout the mid to lower thoracic spine. Disc bulging from T3-4 to T10-11, most notable from T4-5 to T7-8. No evidence of significant spinal stenosis, neural foraminal stenosis, or spinal cord mass effect. Mild thoracic spondylosis without  stenosis. Largely unremarkable appearance of the lumbar spine. No disc herniation or evidence of neural impingement.    Patient Stated Goals  "to get my back right so I do things with less pain"    Currently in Pain?  Yes    Pain Score  6     Pain Location  Thoracic    Pain Orientation  Posterior;Right    Pain Descriptors / Indicators  Sore;Tightness    Pain Type  Chronic pain    Pain Radiating Towards  into B upper shoulders and wraps around into pecotral muscles    Pain Onset  Other (comment)   >1 yr   Pain Frequency  Constant    Aggravating Factors   moving her neck, walking, work as a Associate Professor, bending    Pain Relieving Factors  heating pad, minimal to no relief from Aleve    Effect of Pain on Daily Activities  limits tolerance for work; difficulty with cleaning, esp sweeping or vacuuming (has to take frequent breaks); limited walking tolerance         [facility] PT Assessment - 01/30/20 0931      Assessment   Medical Diagnosis  Mid back pain    Referring Provider (PT)  Eldred Manges, MD    Onset Date/Surgical Date  --  chronic >1 yr   Hand Dominance  Right    Next MD Visit  03/03/20    Prior Therapy  none      Precautions   Precautions  None      Balance Screen   Has the patient fallen in the past 6 months  No    Has the patient had a decrease in activity level because of a fear of falling?   No    Is the patient reluctant to leave their home because of a fear of falling?   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    Leisure  walking -  normally 30-60 min every morning but limited recently due to back, sewing, decorating      Cognition   Overall Cognitive Status  Within Functional Limits for tasks assessed      Observation/Other Assessments   Focus on Therapeutic Outcomes (FOTO)   Back - 48% (52% limitation); Predicted 64% (36% limitation)      Posture/Postural Control   Posture/Postural Control  Postural limitations    Postural Limitations  Forward head;Rounded Shoulders;Decreased lumbar lordosis      ROM / Strength   AROM / PROM / Strength  AROM;Strength      AROM   Overall AROM   Deficits;Due to pain    AROM Assessment Site  Cervical;Thoracic;Lumbar;Shoulder    Right/Left Shoulder  Right;Left    Right Shoulder Flexion  125 Degrees    Right Shoulder ABduction  140 Degrees    Right Shoulder Internal Rotation  --   FIR to T11   Right Shoulder External Rotation  --   FER to T1   Left Shoulder Flexion  126 Degrees    Left Shoulder ABduction  144 Degrees    Left Shoulder Internal Rotation  --   FIR to T10   Left Shoulder External Rotation  --   FER to T1   Cervical Flexion  23    Cervical Extension  24    Cervical - Right Side Bend  30    Cervical - Left Side Bend  25    Cervical - Right Rotation  41    Cervical - Left Rotation  40    Lumbar Flexion  hands to mid shins    Lumbar Extension  40% limited   pain in low back   Lumbar - Right Side Bend  hand to fibular head    Lumbar - Left Side Bend  hand to fibular head    Lumbar - Right Rotation  25% limited    Lumbar - Left Rotation  25% limited      Strength   Strength Assessment Site  Shoulder    Right/Left Shoulder  Right;Left    Right Shoulder Flexion  4-/5   pain   Right Shoulder ABduction  4-/5   pain   Right Shoulder Internal Rotation  4/5    Right Shoulder  External Rotation  4-/5   pain   Left Shoulder Flexion  4-/5   pain   Left Shoulder ABduction  4-/5   pain   Left Shoulder Internal Rotation  4/5    Left Shoulder External Rotation   4-/5      Palpation   Palpation comment  ttp t/o B pecs, UT, LS, rhomboids and periscapular muscles                Objective measurements completed on examination: See above findings.      [facility] Adult PT Treatment/Exercise - 01/30/20 0931      Neck Exercises: Seated   Neck Retraction  10 reps;5 secs    Other Seated Exercise  B scap retraction & depression 10 x 5"      Neck Exercises: Stretches   Upper Trapezius Stretch  Right;Left;30 seconds;1 rep    Levator Stretch  Right;Left;30 seconds;1 rep    Corner Stretch  30 seconds;3 reps    Corner Stretch Limitations  3-way doorway stretch    Other Neck Stretches  B rhomboid stretch             PT Education - 01/30/20 1023    Education Details  PT eval findings, anticipated POC and initial HEP    Person(s) Educated  Patient    Methods  Explanation;Demonstration;Verbal cues;Handout    Comprehension  Verbalized understanding;Returned demonstration;Verbal cues required;Need further instruction       PT Short Term Goals - 01/30/20 1023      PT SHORT TERM GOAL #1   Title  Patient will be independent with initial HEP    Status  New    Target Date  02/20/20      PT SHORT TERM GOAL #2   Title  Patient will verbalize/demonstrate understanding of neutral spine posture and proper body mechanics to reduce strain on neck and thoracolumbar spine    Status  New    Target Date  02/27/20        PT Long Term Goals - 01/30/20 1023      PT LONG TERM GOAL #1   Title  Patient will be independent with ongoing/advanced HEP    Status  New    Target Date  03/26/20      PT LONG TERM GOAL #2   Title  Patient to routinely demonstrate appropriate posture and body mechanics needed for daily activities    Status  New    Target Date  03/26/20      PT LONG TERM GOAL #3   Title  Patient to improve cervical and thoracolumbar AROM to WNL without pain provocation    Status  New    Target Date  03/26/20      PT LONG TERM GOAL #4    Title  Patient to improve B shoulder AROM to WNL without pain provocation    Status  New    Target Date  03/26/20      PT LONG TERM GOAL #5   Title  Patient will demonstrate improved B shoulder strength to >/= 4+/5 for functional UE use with daily home and work tasks    Status  New    Target Date  03/26/20      PT LONG TERM GOAL #6   Title  Patient to report ability to perform ADLs, household and work-related tasks without increased pain    Status  New    Target Date  03/26/20  Plan - 01/30/20 1023    Clinical Impression Statement  Tonga is a 44 y/o female who present to OP PT for chronic mid back pain of >1 yr duration. She denies any specific injury and reports progressively worsening longstanding upper back pain with radiation into upper shoulders, around to chest and down into low back but denies numbness or tingling. 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 Deficits include pain including radicular pain as above; decreased cervical, thoracic, lumbar and B shoulder ROM; decreased flexibility with increased ttp and muscle tension throughout neck, shoulder, periscapular and paraspinal musculature; and UE weakness. Canna will benefit from skilled PT to address above deficits to decrease pain interference and improve posture with walking and normal daily work and household tasks.    Personal Factors and Comorbidities  Time since onset of injury/illness/exacerbation;Comorbidity 2;Fitness;Profession    Comorbidities  HTN, obesity    Examination-Activity Limitations  Carry;Lift;Reach Overhead;Stand;Locomotion Level    Examination-Participation Restrictions  Cleaning;Community Activity;Driving;Interpersonal Relationship;Laundry;Meal Prep;Shop;Yard Work   work as Sales promotion account executive  Stable/Uncomplicated    Midwife  Low    Rehab Potential  Good    PT Frequency  1x / week   only 1x/wk due to $60 copay   PT Duration  8 weeks    PT Treatment/Interventions  ADLs/Self Care Home Management;Cryotherapy;Electrical Stimulation;Iontophoresis 4mg /ml Dexamethasone;Moist Heat;Traction;Ultrasound;Functional mobility training;Therapeutic activities;Therapeutic exercise;Neuromuscular re-education;Patient/family education;Manual techniques;Dry needling;Taping;Spinal Manipulations    PT Next Visit Plan  Brief review of initial HEP; postural strengtheing; posture and body mechanics education; manual therapy and modalities PRN    PT Home Exercise Plan  01/30/20 - UT, LS, rhomboid & pec stretches, chin tuck, scap retraction    Consulted and Agree with Plan of Care  Patient       Patient will benefit from skilled therapeutic intervention in order to improve the following deficits and impairments:  Decreased activity tolerance, Decreased mobility, Decreased range of motion, Decreased strength, Difficulty walking, Increased fascial restricitons, Increased muscle spasms, Impaired perceived functional ability, Impaired flexibility, Impaired UE functional use, Improper body mechanics, Postural dysfunction, Pain  Visit Diagnosis: Pain in thoracic spine  Chronic bilateral low back pain without sciatica  Radiculopathy, cervicothoracic region  Abnormal posture  Muscle spasm of back  Muscle weakness (generalized)     Problem List Patient Active Problem List   Diagnosis Date Noted  . Protrusion of thoracic intervertebral disc 12/03/2019  . Morbid obesity (HCC) 01/14/2016  . Hypokalemia 01/14/2016  . Obstructive sleep apnea 01/14/2016  . Palpitations 01/14/2016  . Metabolic syndrome 01/14/2016  . Left hip pain 12/09/2015  . HTN (hypertension) 10/29/2015  . Allergic rhinitis 10/29/2015  . Pain in joint, shoulder region 10/29/2015  . Neck pain 10/29/2015    14/06/2015, PT, MPT 01/30/2020, 1:35  PM  [facility] [address]  [address] [place], [place], [place] Phone: [phone]   Fax:  [phone]  Name: Dana Patterson MRN: Dana Patterson Date of Birth: [birth year]

## 2020-01-30 NOTE — Patient Instructions (Signed)
    Home exercise program created by Ariela Mochizuki, PT.  For questions, please contact Lusero Nordlund via phone at [phone] or email at [email or web address]  [facility] [place] [address]  [address] [place], [place], [address] Phone: [phone]   Fax:  [phone]    

## 2020-02-04 ENCOUNTER — Ambulatory Visit: Payer: BC Managed Care – PPO

## 2020-02-04 ENCOUNTER — Other Ambulatory Visit: Payer: Self-pay

## 2020-02-04 DIAGNOSIS — M546 Pain in thoracic spine: Secondary | ICD-10-CM

## 2020-02-04 DIAGNOSIS — M5413 Radiculopathy, cervicothoracic region: Secondary | ICD-10-CM

## 2020-02-04 DIAGNOSIS — M545 Low back pain, unspecified: Secondary | ICD-10-CM

## 2020-02-04 DIAGNOSIS — M62830 Muscle spasm of back: Secondary | ICD-10-CM

## 2020-02-04 DIAGNOSIS — R293 Abnormal posture: Secondary | ICD-10-CM

## 2020-02-04 DIAGNOSIS — M6281 Muscle weakness (generalized): Secondary | ICD-10-CM

## 2020-02-04 DIAGNOSIS — G8929 Other chronic pain: Secondary | ICD-10-CM

## 2020-02-04 NOTE — Therapy (Signed)
[facility] [address]  [address] [place], [place], [address] Phone: [phone]   Fax:  [phone]  Physical Therapy Treatment  Patient Details  Name: Dana Patterson MRN: [id number] Date of Birth: [birth year] Referring Provider (PT): Eldred Manges, MD   Encounter Date: 02/04/2020  PT End of Session - 02/04/20 1413    Visit Number  2    Number of Visits  8    Date for PT Re-Evaluation  03/26/20    Authorization Type  BCBS    Authorization - Number of Visits  --   VL = 90   PT Start Time  1401    PT Stop Time  1440    PT Time Calculation (min)  39 min    Activity Tolerance  Patient tolerated treatment well    Behavior During Therapy  [facility] for tasks assessed/performed       Past Medical History:  Diagnosis Date  . Allergy   . HTN (hypertension) 10/29/2015  . Hyperlipidemia   . Vaginal Pap smear, abnormal     Past Surgical History:  Procedure Laterality Date  . ABDOMINAL HYSTERECTOMY     Fibroids per patient  . TUBAL LIGATION      There were no vitals filed for this visit.  Subjective Assessment - 02/04/20 1409    Subjective  Pt. reporting she has had some relief from performing HEP.    Diagnostic tests  Cervical x-ray 12/03/19: Mild mid cervical spondylosis. Negative for acute changes.  Thoracic & lumbar MRIs 11/09/19:Disc desiccation throughout the mid to lower thoracic spine. Disc bulging from T3-4 to T10-11, most notable from T4-5 to T7-8. No evidence of significant spinal stenosis, neural foraminal stenosis, or spinal cord mass effect. Mild thoracic spondylosis without stenosis. Largely unremarkable appearance of the lumbar spine. No disc herniation or evidence of neural impingement.    Patient Stated Goals  "to get my back right so I do things with less pain"    Currently in Pain?  Yes    Pain Score  5     Pain Location  Thoracic    Pain Orientation  Posterior;Right;Left    Pain Descriptors / Indicators   Sore    Pain Type  Chronic pain    Aggravating Factors   lifting, cleaning (scrubbing), work as a Associate Professor    Multiple Pain Sites  No                       [facility] Adult PT Treatment/Exercise - 02/04/20 0001      Neck Exercises: Seated   Neck Retraction  10 reps;5 secs    Neck Retraction Limitations  Minor cueing for technique    Other Seated Exercise  B scap retraction & depression 10 x 5"      Shoulder Exercises: Standing   Row  Both;10 reps;Strengthening;Theraband    Theraband Level (Shoulder Row)  Level 2 (Red)    Row Limitations  Cues for scap. retraction       Shoulder Exercises: ROM/Strengthening   Nustep  Lvl 3, 6 min (UE/LE)      Neck Exercises: Stretches   Upper Trapezius Stretch  Right;Left;30 seconds;2 reps    Levator Stretch  Right;Left;30 seconds;2 reps    Corner Stretch  30 seconds;3 reps    Corner Stretch Limitations  3-way doorway stretch    Other Neck Stretches  B rhomboid stretch 2 x 30 sec  PT Education - 02/04/20 1801    Education Details  HEP update;  red TB row in door    Person(s) Educated  Patient    Methods  Explanation;Demonstration;Verbal cues;Handout    Comprehension  Verbalized understanding;Returned demonstration;Verbal cues required       PT Short Term Goals - 02/04/20 1801      PT SHORT TERM GOAL #1   Title  Patient will be independent with initial HEP    Status  On-going    Target Date  02/20/20      PT SHORT TERM GOAL #2   Title  Patient will verbalize/demonstrate understanding of neutral spine posture and proper body mechanics to reduce strain on neck and thoracolumbar spine    Status  On-going    Target Date  02/27/20        PT Long Term Goals - 02/04/20 1801      PT LONG TERM GOAL #1   Title  Patient will be independent with ongoing/advanced HEP    Status  On-going      PT LONG TERM GOAL #2   Title  Patient to routinely demonstrate appropriate posture and body mechanics needed for  daily activities    Status  On-going      PT LONG TERM GOAL #3   Title  Patient to improve cervical and thoracolumbar AROM to WNL without pain provocation    Status  On-going      PT LONG TERM GOAL #4   Title  Patient to improve B shoulder AROM to WNL without pain provocation    Status  On-going      PT LONG TERM GOAL #5   Title  Patient will demonstrate improved B shoulder strength to >/= 4+/5 for functional UE use with daily home and work tasks    Status  On-going      PT LONG TERM GOAL #6   Title  Patient to report ability to perform ADLs, household and work-related tasks without increased pain    Status  On-going            Plan - 02/04/20 1802    Clinical Impression Statement  Pt. reporting benefit from HEP activities.  Only required minor cueing with HEP review to ensure appropriate stretch sensation on doorway chest stretch.  Initiated red TB scapular row which was tolerated well thus updated HEP with this activity.  Will plan to cover proper posture and body mechanics with daily activities to reduce back/cervical strain in coming visits.    Comorbidities  HTN, obesity    Rehab Potential  Good    PT Treatment/Interventions  ADLs/Self Care Home Management;Cryotherapy;Electrical Stimulation;Iontophoresis 4mg /ml Dexamethasone;Moist Heat;Traction;Ultrasound;Functional mobility training;Therapeutic activities;Therapeutic exercise;Neuromuscular re-education;Patient/family education;Manual techniques;Dry needling;Taping;Spinal Manipulations    PT Next Visit Plan  Postural strengtheing; posture and body mechanics education; manual therapy and modalities PRN    PT Home Exercise Plan  01/30/20 - UT, LS, rhomboid & pec stretches, chin tuck, scap retraction;  02/04/20 - red TB row    Consulted and Agree with Plan of Care  Patient       Patient will benefit from skilled therapeutic intervention in order to improve the following deficits and impairments:  Decreased activity tolerance,  Decreased mobility, Decreased range of motion, Decreased strength, Difficulty walking, Increased fascial restricitons, Increased muscle spasms, Impaired perceived functional ability, Impaired flexibility, Impaired UE functional use, Improper body mechanics, Postural dysfunction, Pain  Visit Diagnosis: Pain in thoracic spine  Chronic bilateral low back pain without sciatica  Radiculopathy, cervicothoracic region  Abnormal posture  Muscle spasm of back  Muscle weakness (generalized)     Problem List Patient Active Problem List   Diagnosis Date Noted  . Protrusion of thoracic intervertebral disc 12/03/2019  . Morbid obesity ([place]) 01/14/2016  . Hypokalemia 01/14/2016  . Obstructive sleep apnea 01/14/2016  . Palpitations 01/14/2016  . Metabolic syndrome 16/08/9603  . Left hip pain 12/09/2015  . HTN (hypertension) 10/29/2015  . Allergic rhinitis 10/29/2015  . Pain in joint, shoulder region 10/29/2015  . Neck pain 10/29/2015    Bess Harvest, PTA 02/04/20 6:08 PM   [facility] [place] [address]  [place], [place], [address] Phone: [phone]   Fax:  [phone]  Name: Joslyn Ramos MRN: [id number] Date of Birth: Mar 23, 1976

## 2020-02-11 ENCOUNTER — Other Ambulatory Visit: Payer: Self-pay

## 2020-02-11 ENCOUNTER — Encounter: Payer: Self-pay | Admitting: Physical Therapy

## 2020-02-11 ENCOUNTER — Ambulatory Visit: Payer: BC Managed Care – PPO | Admitting: Physical Therapy

## 2020-02-11 DIAGNOSIS — M546 Pain in thoracic spine: Secondary | ICD-10-CM

## 2020-02-11 DIAGNOSIS — M62830 Muscle spasm of back: Secondary | ICD-10-CM

## 2020-02-11 DIAGNOSIS — M5413 Radiculopathy, cervicothoracic region: Secondary | ICD-10-CM

## 2020-02-11 DIAGNOSIS — G8929 Other chronic pain: Secondary | ICD-10-CM

## 2020-02-11 DIAGNOSIS — R293 Abnormal posture: Secondary | ICD-10-CM

## 2020-02-11 DIAGNOSIS — M6281 Muscle weakness (generalized): Secondary | ICD-10-CM

## 2020-02-11 NOTE — Patient Instructions (Addendum)

## 2020-02-11 NOTE — Therapy (Signed)
[facility] [address]  [address] [place], [place], [address] Phone: [phone]   Fax:  [phone]  Physical Therapy Treatment  Patient Details  Name: Dana Patterson MRN: [id number] Date of Birth: 1976-07-17 Referring Provider (PT): Eldred Manges, MD   Encounter Date: 02/11/2020  PT End of Session - 02/11/20 1018    Visit Number  3    Number of Visits  8    Date for PT Re-Evaluation  03/26/20    Authorization Type  BCBS    Authorization - Number of Visits  --   VL = 90   PT Start Time  1018    PT Stop Time  1121    PT Time Calculation (min)  63 min    Activity Tolerance  Patient tolerated treatment well    Behavior During Therapy  [facility] for tasks assessed/performed       Past Medical History:  Diagnosis Date  . Allergy   . HTN (hypertension) 10/29/2015  . Hyperlipidemia   . Vaginal Pap smear, abnormal     Past Surgical History:  Procedure Laterality Date  . ABDOMINAL HYSTERECTOMY     Fibroids per patient  . TUBAL LIGATION      There were no vitals filed for this visit.  Subjective Assessment - 02/11/20 1020    Subjective  Pt states "the exercises have really been helping me out."    Diagnostic tests  Cervical x-ray 12/03/19: Mild mid cervical spondylosis. Negative for acute changes.  Thoracic & lumbar MRIs 11/09/19:Disc desiccation throughout the mid to lower thoracic spine. Disc bulging from T3-4 to T10-11, most notable from T4-5 to T7-8. No evidence of significant spinal stenosis, neural foraminal stenosis, or spinal cord mass effect. Mild thoracic spondylosis without stenosis. Largely unremarkable appearance of the lumbar spine. No disc herniation or evidence of neural impingement.    Patient Stated Goals  "to get my back right so I do things with less pain"    Currently in Pain?  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      Shoulder Exercises: Standing   Horizontal ABduction  Both;10 reps;Strengthening;Theraband    Theraband Level (Shoulder Horizontal ABduction)  Level 2 (Red)    Horizontal ABduction Limitations  + scap retraction into pool noodle on wall    External Rotation  Both;10 reps;Strengthening;Theraband    Theraband Level (Shoulder External Rotation)  Level 2 (Red)    External Rotation Limitations  + scap retraction into pool noodle on wall    Row  Both;10 reps;Strengthening;Theraband    Theraband Level (Shoulder Row)  Level 2 (Red)    Row Limitations  cues for scap retraction maintaining forearms parallel to floor with 3-5 sec hold    Retraction  Both;10 reps;Strengthening;Theraband    Theraband Level (Shoulder Retraction)  Level 2 (Red)    Retraction  Limitations  cues for scap retraction with 3-5 sec hold    Diagonals  Both;10 reps;Strengthening;Theraband    Theraband Level (Shoulder Diagonals)  Level 2 (Red)    Diagonals Limitations  + scap retraction into pool noodle on wall      Shoulder Exercises: ROM/Strengthening   Nustep  L3 x 6 min (UE/LE)   seat #7     Modalities   Modalities  Electrical Stimulation;Moist Heat      Moist Heat Therapy   Number Minutes Moist Heat  15 Minutes    Moist Heat Location  Lumbar Spine   B thoracolumbar paraspinals     Electrical Stimulation   Electrical Stimulation Location  B thoracolumbar paraspinals    Electrical Stimulation Action  IFC    Electrical Stimulation Parameters  80-150 Hz, intensity to pt tolerance x 15'             PT Education - 02/11/20 1100    Education Details  Posture & body mechanics  education for typical daily home and work tasks;  HEP update - red TB low row/scap retraction, red TB scap retraction + horiz abduction, ER & UE diagonals    Person(s) Educated  Patient    Methods  Explanation;Demonstration;Verbal cues;Handout    Comprehension  Verbalized understanding;Returned demonstration;Verbal cues required;Need further instruction       PT Short Term Goals - 02/04/20 1801      PT SHORT TERM GOAL #1   Title  Patient will be independent with initial HEP    Status  On-going    Target Date  02/20/20      PT SHORT TERM GOAL #2   Title  Patient will verbalize/demonstrate understanding of neutral spine posture and proper body mechanics to reduce strain on neck and thoracolumbar spine    Status  On-going    Target Date  02/27/20        PT Long Term Goals - 02/04/20 1801      PT LONG TERM GOAL #1   Title  Patient will be independent with ongoing/advanced HEP    Status  On-going      PT LONG TERM GOAL #2   Title  Patient to routinely demonstrate appropriate posture and body mechanics needed for daily activities    Status  On-going      PT LONG TERM GOAL #3   Title  Patient to improve cervical and thoracolumbar AROM to WNL without pain provocation    Status  On-going      PT LONG TERM GOAL #4   Title  Patient to improve B shoulder AROM to WNL without pain provocation    Status  On-going      PT LONG TERM GOAL #5   Title  Patient will demonstrate improved B shoulder strength to >/= 4+/5 for functional UE use with daily home and work tasks    Status  On-going      PT LONG TERM GOAL #6   Title  Patient to report ability to perform ADLs, household and work-related tasks without increased pain    Status  On-going            Plan - 02/11/20 1022    Clinical Impression Statement  [name] continues to note good benefit from HEP stretches and exercises. Review of latest update revealed patient performing more of a low row + scapular retraction (although  with good form) vs standard row indicated in HEP instruction - clarified the difference and provided formal instruction for  the former to include in HEP along with scapular retraction + red TB horizontal abduction, ER & UE diagonals as patient noting that these exercises felt good as well. 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 Treatment concluded with trial of estim and moist heat to promote further muscle relaxation and pain relief - will assess response next visit and provide information on home TENS units if benefit noted.    Personal Factors and Comorbidities  Time since onset of injury/illness/exacerbation;Comorbidity 2;Fitness;Profession    Comorbidities  HTN, obesity    Examination-Activity Limitations  Carry;Lift;Reach Overhead;Stand;Locomotion Level    Examination-Participation Restrictions  Cleaning;Community Activity;Driving;Interpersonal Relationship;Laundry;Meal Prep;Shop;Yard Work    Rehab Potential  Good    PT Frequency  1x / week   only 1x/wk due to $60 copay   PT Duration  8 weeks    PT Treatment/Interventions  ADLs/Self Care Home Management;Cryotherapy;Electrical Stimulation;Iontophoresis 4mg /ml Dexamethasone;Moist Heat;Traction;Ultrasound;Functional mobility training;Therapeutic activities;Therapeutic exercise;Neuromuscular re-education;Patient/family education;Manual techniques;Dry needling;Taping;Spinal Manipulations    PT Next Visit Plan  assess response to estim and provide info on home TENS unit options if benefit noted; postural strengtheing; manual therapy and modalities PRN; review of posture and body mechanics education PRN    PT Home Exercise Plan  01/30/20 - UT, LS, rhomboid & pec stretches, chin tuck, scap retraction;  02/04/20 - red TB row; 02/10/20 - red TB low row/scap retraction, red TB scap retraction + horiz  abduction, ER & UE diagonals    Consulted and Agree with Plan of Care  Patient       Patient will benefit from skilled therapeutic intervention in order to improve the following deficits and impairments:  Decreased activity tolerance, Decreased mobility, Decreased range of motion, Decreased strength, Difficulty walking, Increased fascial restricitons, Increased muscle spasms, Impaired perceived functional ability, Impaired flexibility, Impaired UE functional use, Improper body mechanics, Postural dysfunction, Pain  Visit Diagnosis: Pain in thoracic spine  Chronic bilateral low back pain without sciatica  Radiculopathy, cervicothoracic region  Abnormal posture  Muscle spasm of back  Muscle weakness (generalized)     Problem List Patient Active Problem List   Diagnosis Date Noted  . Protrusion of thoracic intervertebral disc 12/03/2019  . Morbid obesity ([place]) 01/14/2016  . Hypokalemia 01/14/2016  . Obstructive sleep apnea 01/14/2016  . Palpitations 01/14/2016  . Metabolic syndrome 29/93/7169  . Left hip pain 12/09/2015  . HTN (hypertension) 10/29/2015  . Allergic rhinitis 10/29/2015  . Pain in joint, shoulder region 10/29/2015  . Neck pain 10/29/2015    Percival Spanish, PT, MPT 02/11/2020, 12:43 PM  [facility] [address]  [place], [place], [address] Phone: [phone]   Fax:  [phone]  Name: Dana Patterson MRN: [id number] Date of Birth: 1976/05/05

## 2020-02-17 ENCOUNTER — Telehealth: Payer: Self-pay

## 2020-02-17 NOTE — Telephone Encounter (Signed)
Called pt to schedule an appt for [place] and f/u OV. Left message for pt to call the office back. Arthur Speagle l Tomoki Lucken, CMA

## 2020-02-17 NOTE — Telephone Encounter (Signed)
-----   Message from Willodean Rosenthal, MD sent at 02/17/2020 10:56 AM EDT ----- Please call pt. I have reviewed her records from [place]. She needs a f/u TV US.  To eval the stability of the right ov cyst.   Thanks,   Clh-S

## 2020-02-18 ENCOUNTER — Other Ambulatory Visit: Payer: Self-pay

## 2020-02-18 ENCOUNTER — Ambulatory Visit: Payer: BC Managed Care – PPO

## 2020-02-18 DIAGNOSIS — M546 Pain in thoracic spine: Secondary | ICD-10-CM

## 2020-02-18 DIAGNOSIS — M62830 Muscle spasm of back: Secondary | ICD-10-CM

## 2020-02-18 DIAGNOSIS — R293 Abnormal posture: Secondary | ICD-10-CM

## 2020-02-18 DIAGNOSIS — M6281 Muscle weakness (generalized): Secondary | ICD-10-CM

## 2020-02-18 DIAGNOSIS — M5413 Radiculopathy, cervicothoracic region: Secondary | ICD-10-CM

## 2020-02-18 DIAGNOSIS — G8929 Other chronic pain: Secondary | ICD-10-CM

## 2020-02-18 NOTE — Patient Instructions (Signed)

## 2020-02-18 NOTE — Therapy (Signed)
[facility] [place] [address]  [place], [place], [address] Phone: [phone]   Fax:  [phone]  Physical Therapy Treatment  Patient Details  Name: Dana Patterson MRN: [id number] Date of Birth: October 08, 1976 Referring Provider (PT): Marybelle Killings, MD   Encounter Date: 02/18/2020  PT End of Session - 02/18/20 1203    Visit Number  4    Number of Visits  8    Date for PT Re-Evaluation  03/26/20    Authorization Type  BCBS    Authorization - Number of Visits  --   VL = 90   PT Start Time  1016    PT Stop Time  1113    PT Time Calculation (min)  57 min    Activity Tolerance  Patient tolerated treatment well    Behavior During Therapy  [facility] for tasks assessed/performed       Past Medical History:  Diagnosis Date  . Allergy   . HTN (hypertension) 10/29/2015  . Hyperlipidemia   . Vaginal Pap smear, abnormal     Past Surgical History:  Procedure Laterality Date  . ABDOMINAL HYSTERECTOMY     Fibroids per patient  . TUBAL LIGATION      There were no vitals filed for this visit.  Subjective Assessment - 02/18/20 1202    Subjective  Pt. reporting the stretching and band exercises continue to reduce her pain.    Diagnostic tests  Cervical x-ray 12/03/19: Mild mid cervical spondylosis. Negative for acute changes.  Thoracic & lumbar MRIs 11/09/19:Disc desiccation throughout the mid to lower thoracic spine. Disc bulging from T3-4 to T10-11, most notable from T4-5 to T7-8. No evidence of significant spinal stenosis, neural foraminal stenosis, or spinal cord mass effect. Mild thoracic spondylosis without stenosis. Largely unremarkable appearance of the lumbar spine. No disc herniation or evidence of neural impingement.    Patient Stated Goals  "to get my back right so I do things with less pain"    Currently in Pain?  No/denies    Pain Score  0-No pain    Multiple Pain Sites  No                       [facility]  Adult PT Treatment/Exercise - 02/18/20 0001      Self-Care   Self-Care  Other Self-Care Comments    Other Self-Care Comments   education on electrical stimulation and home TENS unit with rationale and electrode placement       Shoulder Exercises: Standing   Horizontal ABduction  Both;12 reps;Strengthening;Theraband    Theraband Level (Shoulder Horizontal ABduction)  Level 2 (Red)    Horizontal ABduction Limitations  + scap retraction into pool noodle on wall    External Rotation  Both;12 reps;Strengthening;Theraband    Theraband Level (Shoulder External Rotation)  Level 2 (Red)    External Rotation Limitations  + scap retraction into pool noodle on wall    Extension  Both;10 reps;Theraband;Strengthening    Row  Both;10 reps    Theraband Level (Shoulder Row)  Level 3 (Green)    Diagonals  Both;10 reps;Strengthening;Theraband    Theraband Level (Shoulder Diagonals)  Level 2 (Red)    Diagonals Limitations  + scap retraction into pool noodle on wall      Shoulder Exercises: ROM/Strengthening   Nustep  L3 x 6 min (UE/LE)      Moist Heat Therapy   Number Minutes Moist Heat  15 Minutes  Moist Heat Location  Lumbar Spine      Electrical Stimulation   Electrical Stimulation Location  B thoracolumbar paraspinals    Electrical Stimulation Action  IFC    Electrical Stimulation Parameters  80-'150Hz'$ , intensity to pt. tolerance, 15'    Electrical Stimulation Goals  Tone      Neck Exercises: [place]  30 seconds;3 reps    Corner Stretch Limitations  mid in doorway - single arm at at time                PT Short Term Goals - 02/18/20 1203      PT SHORT TERM GOAL #1   Title  Patient will be independent with initial HEP    Status  Achieved    Target Date  02/20/20      PT SHORT TERM GOAL #2   Title  Patient will verbalize/demonstrate understanding of neutral spine posture and proper body mechanics to reduce strain on neck and thoracolumbar spine    Status   On-going    Target Date  02/27/20        PT Long Term Goals - 02/04/20 1801      PT LONG TERM GOAL #1   Title  Patient will be independent with ongoing/advanced HEP    Status  On-going      PT LONG TERM GOAL #2   Title  Patient to routinely demonstrate appropriate posture and body mechanics needed for daily activities    Status  On-going      PT LONG TERM GOAL #3   Title  Patient to improve cervical and thoracolumbar AROM to WNL without pain provocation    Status  On-going      PT LONG TERM GOAL #4   Title  Patient to improve B shoulder AROM to WNL without pain provocation    Status  On-going      PT LONG TERM GOAL #5   Title  Patient will demonstrate improved B shoulder strength to >/= 4+/5 for functional UE use with daily home and work tasks    Status  On-going      PT LONG TERM GOAL #6   Title  Patient to report ability to perform ADLs, household and work-related tasks without increased pain    Status  On-going            Plan - 02/18/20 [place] reporting continued benefit from HEP activities especially band strengthening activities.  Noted good benefit from E-stim utilized for pain relief last visit thus issued handout for home TENS unit purchase options with pt. verbalizing plans to purchase and bring unit for further instruction to upcoming sessions.  Denies questions regarding initial HEP thus STG #1 met.  Tolerated all postural/scapular strengthening activities and anterior chest stretching without pain.  Ended visit with complaint of mid back "tension" thus applied E-stim/moist heat to lumbar/thoracic spine/upper back with good relief noted following.    Comorbidities  HTN, obesity    Rehab Potential  Good    PT Treatment/Interventions  ADLs/Self Care Home Management;Cryotherapy;Electrical Stimulation;Iontophoresis '4mg'$ /ml Dexamethasone;Moist Heat;Traction;Ultrasound;Functional mobility training;Therapeutic  activities;Therapeutic exercise;Neuromuscular re-education;Patient/family education;Manual techniques;Dry needling;Taping;Spinal Manipulations    PT Next Visit Plan  further instruction in home TENS unit once pt. has purchased if needed;  postural strengtheing; manual therapy and modalities PRN; review of posture and body mechanics education PRN    PT Home Exercise Plan  01/30/20 - UT, LS, rhomboid & pec  stretches, chin tuck, scap retraction;  02/04/20 - red TB row; 02/10/20 - red TB low row/scap retraction, red TB scap retraction + horiz abduction, ER & UE diagonals    Consulted and Agree with Plan of Care  Patient       Patient will benefit from skilled therapeutic intervention in order to improve the following deficits and impairments:  Decreased activity tolerance, Decreased mobility, Decreased range of motion, Decreased strength, Difficulty walking, Increased fascial restricitons, Increased muscle spasms, Impaired perceived functional ability, Impaired flexibility, Impaired UE functional use, Improper body mechanics, Postural dysfunction, Pain  Visit Diagnosis: Pain in thoracic spine  Chronic bilateral low back pain without sciatica  Radiculopathy, cervicothoracic region  Abnormal posture  Muscle spasm of back  Muscle weakness (generalized)     Problem List Patient Active Problem List   Diagnosis Date Noted  . Protrusion of thoracic intervertebral disc 12/03/2019  . Morbid obesity ([place]) 01/14/2016  . Hypokalemia 01/14/2016  . Obstructive sleep apnea 01/14/2016  . Palpitations 01/14/2016  . Metabolic syndrome 53/29/9242  . Left hip pain 12/09/2015  . HTN (hypertension) 10/29/2015  . Allergic rhinitis 10/29/2015  . Pain in joint, shoulder region 10/29/2015  . Neck pain 10/29/2015    Bess Harvest, PTA 02/18/20 12:08 PM   [facility] [place] [address]  [place], [place], [address] Phone: [phone]   Fax:   [phone]  Name: Annabeth Tortora MRN: [id number] Date of Birth: 1976/10/23

## 2020-02-26 ENCOUNTER — Ambulatory Visit: Payer: BC Managed Care – PPO | Attending: Orthopaedic Surgery | Admitting: Physical Therapy

## 2020-02-26 ENCOUNTER — Encounter: Payer: Self-pay | Admitting: Physical Therapy

## 2020-02-26 ENCOUNTER — Other Ambulatory Visit: Payer: Self-pay

## 2020-02-26 DIAGNOSIS — R293 Abnormal posture: Secondary | ICD-10-CM | POA: Diagnosis present

## 2020-02-26 DIAGNOSIS — M545 Low back pain, unspecified: Secondary | ICD-10-CM

## 2020-02-26 DIAGNOSIS — M546 Pain in thoracic spine: Secondary | ICD-10-CM | POA: Diagnosis present

## 2020-02-26 DIAGNOSIS — M62830 Muscle spasm of back: Secondary | ICD-10-CM | POA: Diagnosis present

## 2020-02-26 DIAGNOSIS — G8929 Other chronic pain: Secondary | ICD-10-CM | POA: Diagnosis present

## 2020-02-26 DIAGNOSIS — M6281 Muscle weakness (generalized): Secondary | ICD-10-CM | POA: Diagnosis present

## 2020-02-26 DIAGNOSIS — M5413 Radiculopathy, cervicothoracic region: Secondary | ICD-10-CM | POA: Insufficient documentation

## 2020-02-26 NOTE — Patient Instructions (Signed)
    Home exercise program created by Norvel Wenker, PT.  For questions, please contact Delmy Holdren via phone at [phone] or email at [email or web address]  [facility] [place] [address]  [address] [place], [place], [address] Phone: [phone]   Fax:  [phone]    

## 2020-02-26 NOTE — Therapy (Addendum)
[facility] [address]  [place], [place], [address] Phone: [phone]   Fax:  [phone]  Physical Therapy Treatment / Progress Note / Discharge Summary  Patient Details  Name: Dana Patterson MRN: [id number] Date of Birth: Jul 22, 1976 Referring Provider (PT): Marybelle Killings, MD  Progress Note  Reporting Period 01/30/20 to 02/26/20  See note below for Objective Data and Assessment of Progress/Goals.     Encounter Date: 02/26/2020  PT End of Session - 02/26/20 1019    Visit Number  5    Number of Visits  8    Date for PT Re-Evaluation  03/26/20    Authorization Type  BCBS    Authorization - Number of Visits  --   VL = 90   PT Start Time  1019    PT Stop Time  1100    PT Time Calculation (min)  41 min    Activity Tolerance  Patient tolerated treatment well    Behavior During Therapy  WFL for tasks assessed/performed       Past Medical History:  Diagnosis Date  . Allergy   . HTN (hypertension) 10/29/2015  . Hyperlipidemia   . Vaginal Pap smear, abnormal     Past Surgical History:  Procedure Laterality Date  . ABDOMINAL HYSTERECTOMY     Fibroids per patient  . TUBAL LIGATION      There were no vitals filed for this visit.  Subjective Assessment - 02/26/20 1022    Subjective  Pt  denies pain today. Feels like "using good posture is really helping me out".    Diagnostic tests  Cervical x-ray 12/03/19: Mild mid cervical spondylosis. Negative for acute changes.  Thoracic & lumbar MRIs 11/09/19:Disc desiccation throughout the mid to lower thoracic spine. Disc bulging from T3-4 to T10-11, most notable from T4-5 to T7-8. No evidence of significant spinal stenosis, neural foraminal stenosis, or spinal cord mass effect. Mild thoracic spondylosis without stenosis. Largely unremarkable appearance of the lumbar spine. No disc herniation or evidence of neural impingement.    Patient Stated Goals  "to get my back right  so I do things with less pain"    Currently in Pain?  No/denies         [facility] PT Assessment - 02/26/20 1019      Assessment   Medical Diagnosis  Mid back pain    Referring Provider (PT)  Marybelle Killings, MD    Onset Date/Surgical Date  --   chronic >1 yr   Hand Dominance  Right    Next MD Visit  03/03/20      AROM   Right Shoulder Flexion  146 Degrees    Right Shoulder ABduction  159 Degrees    Right Shoulder Internal Rotation  --   FIR to T11   Right Shoulder External Rotation  --   FER to T2   Left Shoulder Flexion  150 Degrees    Left Shoulder ABduction  159 Degrees    Left Shoulder Internal Rotation  --   FIR to T9   Left Shoulder External Rotation  --   FER to T2   Cervical Flexion  32    Cervical Extension  30    Cervical - Right Side Bend  40    Cervical - Left Side Bend  33    Cervical - Right Rotation  60    Cervical - Left Rotation  52    Lumbar Flexion  hands to ankles - mild tightness in back    Lumbar Extension  WFL    Lumbar - Right Side Bend  WFL    Lumbar - Left Side Bend  WFL    Lumbar - Right Rotation  [facility]    Lumbar - Left Rotation  [facility]      Strength   Overall Strength Comments  all motions pain free with resistance    Right Shoulder Flexion  4+/5    Right Shoulder ABduction  4+/5    Right Shoulder Internal Rotation  4+/5    Right Shoulder External Rotation  4+/5    Left Shoulder Flexion  4+/5    Left Shoulder ABduction  4+/5    Left Shoulder Internal Rotation  4+/5    Left Shoulder External Rotation  4+/5                   [facility] Adult PT Treatment/Exercise - 02/26/20 1019      Lumbar Exercises: Stretches   Quadruped Mid Back Stretch  30 seconds;2 reps   each position   Quadruped Mid Back Stretch Limitations  quadruped & seated 3-way prayer stretch      Shoulder Exercises: Standing   Horizontal ABduction  Both;10 reps;Strengthening;Theraband    Theraband Level (Shoulder Horizontal ABduction)  Level 3 (Green)    Horizontal  ABduction Limitations  + scap retraction into pool noodle on wall    External Rotation  Both;10 reps;Strengthening;Theraband    Theraband Level (Shoulder External Rotation)  Level 3 (Green)    External Rotation Limitations  + scap retraction into pool noodle on wall; cues to keep elbows tucked at sides    Row  Both;10 reps;Strengthening;Theraband    Theraband Level (Shoulder Row)  Level 3 (Green)    Row Limitations  cues for scap retraction maintaining forearms parallel to floor with 3-5 sec hold    Retraction  Both;10 reps;Strengthening;Theraband    Theraband Level (Shoulder Retraction)  Level 3 (Green)    Retraction Limitations  cues for scap retraction with 3-5 sec hold    Diagonals  Both;10 reps;Strengthening;Theraband    Theraband Level (Shoulder Diagonals)  Level 3 (Green)    Diagonals Limitations  + scap retraction into pool noodle on wall      Shoulder Exercises: ROM/Strengthening   Nustep  L4 x 6 min (UE/LE)             PT Education - 02/26/20 1100    Education Details  HEP update - seated 3-way prayer stretch    Person(s) Educated  Patient    Methods  Explanation;Demonstration;Handout    Comprehension  Verbalized understanding;Returned demonstration       PT Short Term Goals - 02/26/20 1024      PT SHORT TERM GOAL #1   Title  Patient will be independent with initial HEP    Status  Achieved   02/18/20     PT SHORT TERM GOAL #2   Title  Patient will verbalize/demonstrate understanding of neutral spine posture and proper body mechanics to reduce strain on neck and thoracolumbar spine    Status  Achieved   02/26/20       PT Long Term Goals - 02/26/20 1024      PT LONG TERM GOAL #1   Title  Patient will be independent with ongoing/advanced HEP    Status  Partially Met    Target Date  03/26/20      PT LONG TERM GOAL #2   Title  Patient to routinely demonstrate appropriate posture and body mechanics needed for daily activities    Status  Achieved   02/26/20    Target Date  --      PT LONG TERM GOAL #3   Title  Patient to improve cervical and thoracolumbar AROM to WNL without pain provocation    Status  Partially Met    Target Date  03/26/20      PT LONG TERM GOAL #4   Title  Patient to improve B shoulder AROM to WNL without pain provocation    Status  Achieved   02/26/20   Target Date  --      PT LONG TERM GOAL #5   Title  Patient will demonstrate improved B shoulder strength to >/= 4+/5 for functional UE use with daily home and work tasks    Status  Achieved   02/26/20   Target Date  --      PT LONG TERM GOAL #6   Title  Patient to report ability to perform ADLs, household and work-related tasks without increased pain    Status  Achieved   02/26/20   Target Date  --            Plan - 02/26/20 1026    Clinical Impression Statement  Dana Patterson is very pleased with her progress and reports no pain currently, attributing benefit to both stretches/exercises as well as improved postural awareness. She has demonstrated significant improvement in cervical and thoracolumbar ROM and B shoulder ROM now WNL, all without pain and only noting some tightness with thoracolumbar flexion. B shoulder strength now 4+/5 without pain provocation. She reports she is able to complete her normal daily tasks including work tasks without pain interference. All STGs met along with LTGs #2, 4, 5 & 6 with remaining two LTGs partially met. Her HEP has been going well with progression of resistance bands provided today as well as stretching added to target ongoing tightness in thoracolumbar flexion. We have discussed reducing frequency to qow with patient to cancel next week's appointment if she continues to do well with HEP and will consider full transition to HEP as of next visit. She is looking into purchasing a home TENS unit and PT will plan to provide further instruction in home use as needed.    Personal Factors and Comorbidities  Time since onset of  injury/illness/exacerbation;Comorbidity 2;Fitness;Profession    Comorbidities  HTN, obesity    Examination-Activity Limitations  Carry;Lift;Reach Overhead;Stand;Locomotion Level    Examination-Participation Restrictions  Cleaning;Community Activity;Driving;Interpersonal Relationship;Laundry;Meal Prep;Shop;Yard Work    Rehab Potential  Good    PT Frequency  1x / week   only 1x/wk due to $60 copay   PT Duration  8 weeks    PT Treatment/Interventions  ADLs/Self Care Home Management;Cryotherapy;Electrical Stimulation;Iontophoresis '4mg'$ /ml Dexamethasone;Moist Heat;Traction;Ultrasound;Functional mobility training;Therapeutic activities;Therapeutic exercise;Neuromuscular re-education;Patient/family education;Manual techniques;Dry needling;Taping;Spinal Manipulations    PT Next Visit Plan  cervical & thoracolumbar flexibility; postural strengtheing; manual therapy and modalities PRN; review of posture and body mechanics education PRN; further instruction in home TENS unit PRN    PT Home Exercise Plan  01/30/20 - UT, LS, rhomboid & pec stretches, chin tuck, scap retraction;  02/04/20 - red TB row; 02/10/20 - red TB low row/scap retraction, red TB scap retraction + horiz abduction, ER & UE diagonals    Consulted and Agree with Plan of Care  Patient       Patient will benefit from skilled therapeutic intervention in order to improve the following  deficits and impairments:  Decreased activity tolerance, Decreased mobility, Decreased range of motion, Decreased strength, Difficulty walking, Increased fascial restricitons, Increased muscle spasms, Impaired perceived functional ability, Impaired flexibility, Impaired UE functional use, Improper body mechanics, Postural dysfunction, Pain  Visit Diagnosis: Pain in thoracic spine  Chronic bilateral low back pain without sciatica  Radiculopathy, cervicothoracic region  Abnormal posture  Muscle spasm of back  Muscle weakness (generalized)     Problem  List Patient Active Problem List   Diagnosis Date Noted  . Protrusion of thoracic intervertebral disc 12/03/2019  . Morbid obesity ([place]) 01/14/2016  . Hypokalemia 01/14/2016  . Obstructive sleep apnea 01/14/2016  . Palpitations 01/14/2016  . Metabolic syndrome 17/35/6701  . Left hip pain 12/09/2015  . HTN (hypertension) 10/29/2015  . Allergic rhinitis 10/29/2015  . Pain in joint, shoulder region 10/29/2015  . Neck pain 10/29/2015    Percival Spanish, PT, MPT 02/26/2020, 11:22 AM  [facility] [address]  [place], [place], [address] Phone: [phone]   Fax:  [phone]  Name: Dana Patterson MRN: [id number] Date of Birth: 30-Mar-1976   PHYSICAL THERAPY DISCHARGE SUMMARY  Visits from Start of Care: 5  Current functional level related to goals / functional outcomes:   Refer to above clinical impression for status as of last visit on 02/26/2020. As of 03/11/20, patient called to cancel all remaining visits stating she was doing well, and requested 30-day hold. She has not needed to return to PT in the 30 days since her last visit, therefore will proceed with discharge from PT for this episode.   Remaining deficits:   As above.    Education / Equipment:   HEP, Equities trader  Plan: Patient agrees to discharge.  Patient goals were partially met. Patient is being discharged due to being pleased with the current functional level.  ?????     Percival Spanish, PT, MPT 04/03/20, 8:46 AM  [facility] [address]  [place], [place], [address] Phone: [phone]   Fax:  [phone]

## 2020-03-02 ENCOUNTER — Ambulatory Visit: Payer: BC Managed Care – PPO | Admitting: Physical Therapy

## 2020-03-02 ENCOUNTER — Other Ambulatory Visit: Payer: Self-pay

## 2020-03-03 ENCOUNTER — Ambulatory Visit: Payer: Self-pay | Admitting: Orthopaedic Surgery

## 2020-03-09 ENCOUNTER — Ambulatory Visit (INDEPENDENT_AMBULATORY_CARE_PROVIDER_SITE_OTHER): Payer: BC Managed Care – PPO | Admitting: Obstetrics & Gynecology

## 2020-03-09 ENCOUNTER — Encounter: Payer: Self-pay | Admitting: Obstetrics & Gynecology

## 2020-03-09 ENCOUNTER — Other Ambulatory Visit: Payer: Self-pay

## 2020-03-09 VITALS — BP 145/91 | HR 63 | Ht 62.0 in | Wt 233.0 lb

## 2020-03-09 DIAGNOSIS — Z1231 Encounter for screening mammogram for malignant neoplasm of breast: Secondary | ICD-10-CM

## 2020-03-09 DIAGNOSIS — N9489 Other specified conditions associated with female genital organs and menstrual cycle: Secondary | ICD-10-CM

## 2020-03-09 NOTE — Progress Notes (Signed)
History:  44 y.o. [id number] here today for f/u of OV cyst/adnexal mass. Pt report no pelvic pain or discomfort. She was not able to get her mammogram last year due to insurance issues.    The following portions of the patient's history were reviewed and updated as appropriate: allergies, current medications, past family history, past medical history, past social history, past surgical history and problem list.  Review of Systems:  Pertinent items are noted in HPI.    Objective:  Physical Exam Blood pressure (!) 145/91, pulse 63, height 5\' 2"  (1.575 m), weight 233 lb (105.7 kg).  CONSTITUTIONAL: Well-developed, well-nourished female in no acute distress.  HENT:  Normocephalic, atraumatic EYES: Conjunctivae and EOM are normal. No scleral icterus.  NECK: Normal range of motion SKIN: Skin is warm and dry. No rash noted. Not diaphoretic.No pallor. NEUROLGIC: Alert and oriented to person, place, and time. Normal coordination.  Abd: Soft, nontender and nondistended; no masses palpated.  Pelvic: Normal appearing external genitalia; normal appearing vaginal mucosa. Uterus and cervix surgically absent  Normal discharge.  No palpable masses or adnexal tenderness  Labs and Imaging 11/12/2019 CT Reproductive: Status post hysterectomy. Cystic mass in the right adnexa measures 7.1 cm obliquely by 6.4 x 5.2 cm transversely, increased in size from the prior CT where it measured 5.6 x 4.6 x 5.5 cm. No left adnexal mass or abnormality.  Other: Trace amount of pelvic free fluid lying along the posterior right vaginal cuff to the right margin of the rectosigmoid, similar to the prior CT.  Musculoskeletal: No acute or significant osseous findings.  IMPRESSION: 1. Cystic mass in the right pelvis, likely ovarian in origin, measuring 7.1 cm in long axis, increased in size compared to the prior CT. A cystic ovarian neoplasm should be considered. Mass could be further assessed with transabdominal and  endovaginal pelvic ultrasound and/or pelvic MRI without and with contrast. 2. No acute findings within the abdomen or pelvis. 3. Hepatic steatosis.  Assessment & Plan:  F/u of adnexal mass on 11/14/2019. Given the description, I suspect that this may be a hydrosalpinx. Rec a repeat US to eval. If no changes will follow.   - Pelvic US - screening mammogram  - reviewed with pt the importance of breast cancer screening. Even if there are ins issues we can get pt a mammogram with a grant.  F/u in 1 year or sooner prn   Total face-to-face time with patient was 18 min.  Greater than 50% was spent in counseling and coordination of care with the patient.   Caymen Dubray L. Harraway-Smith, M.D., [place]

## 2020-03-09 NOTE — Progress Notes (Signed)
Follow up right ovarian cyst. Armandina Stammer RN

## 2020-03-11 ENCOUNTER — Ambulatory Visit (HOSPITAL_BASED_OUTPATIENT_CLINIC_OR_DEPARTMENT_OTHER)
Admission: RE | Admit: 2020-03-11 | Discharge: 2020-03-11 | Disposition: A | Discharge status: Home / Self Care | Payer: BC Managed Care – PPO | Source: Ambulatory Visit | Attending: Obstetrics & Gynecology | Admitting: Obstetrics & Gynecology

## 2020-03-11 ENCOUNTER — Ambulatory Visit: Payer: BC Managed Care – PPO

## 2020-03-11 ENCOUNTER — Other Ambulatory Visit: Payer: Self-pay

## 2020-03-11 ENCOUNTER — Ambulatory Visit (HOSPITAL_BASED_OUTPATIENT_CLINIC_OR_DEPARTMENT_OTHER): Payer: BC Managed Care – PPO

## 2020-03-11 DIAGNOSIS — N9489 Other specified conditions associated with female genital organs and menstrual cycle: Secondary | ICD-10-CM | POA: Diagnosis not present

## 2020-03-11 IMAGING — US US TRANSVAGINAL NON-OB
1 series · 13 of 23 positions shown · non-contrast
Comparison: Prior ultrasound from [DATE].

CLINICAL DATA: Follow-up examination for right ovarian cyst.

EXAM:
ULTRASOUND PELVIS TRANSVAGINAL
TECHNIQUE: Transvaginal ultrasound examination of the pelvis was performed
including evaluation of the uterus, ovaries, adnexal regions, and
pelvic cul-de-sac.

[Series 1: us transvaginal non-ob · 23 acquisitions, 13 frames shown]
[im 1/23]
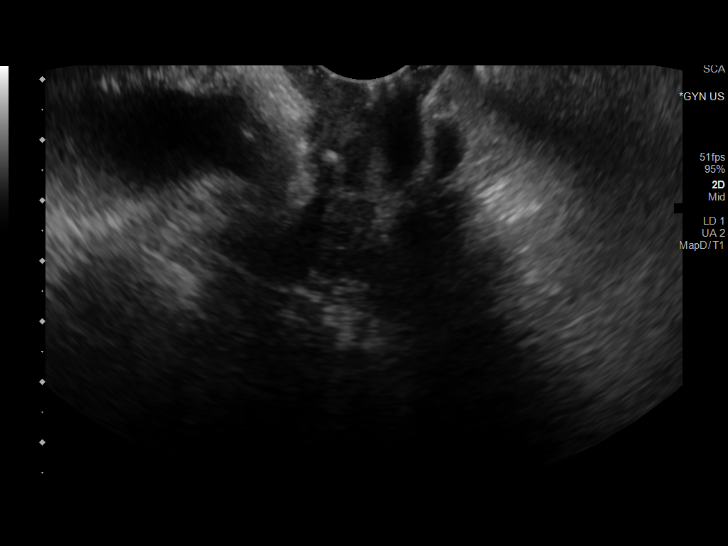
[im 3/23]
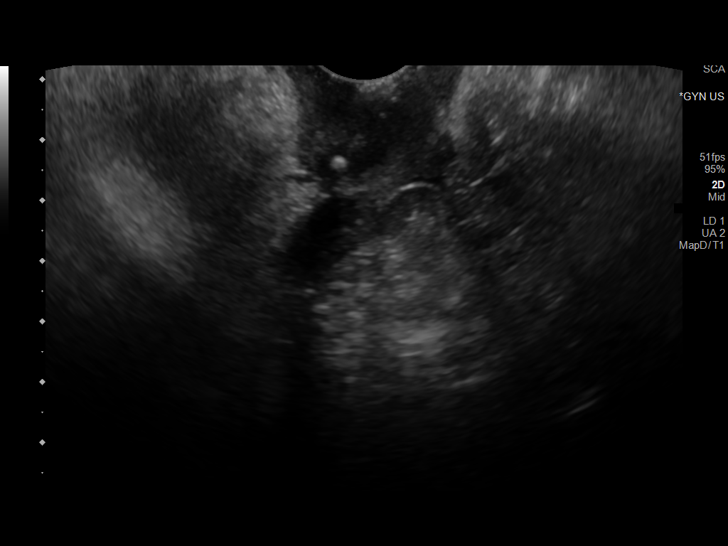
[im 5/23]
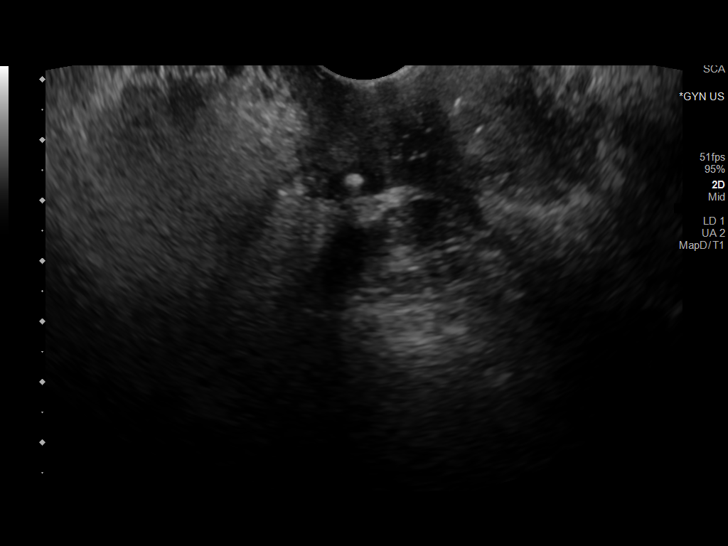
[im 7/23]
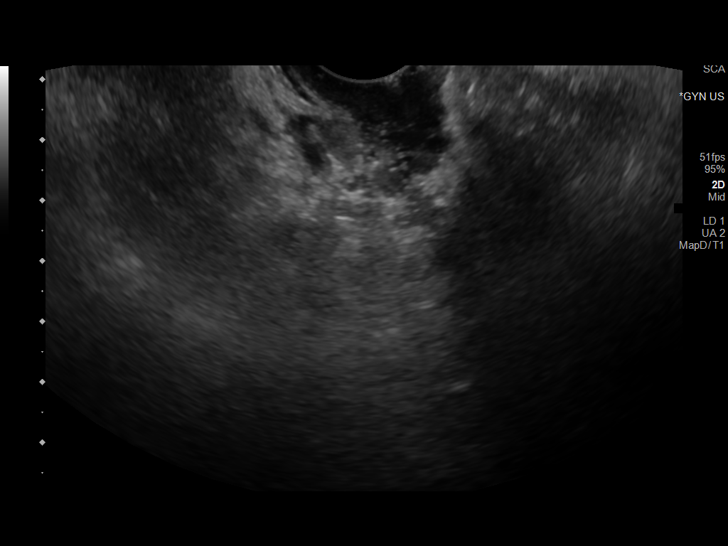
[im 8/23]
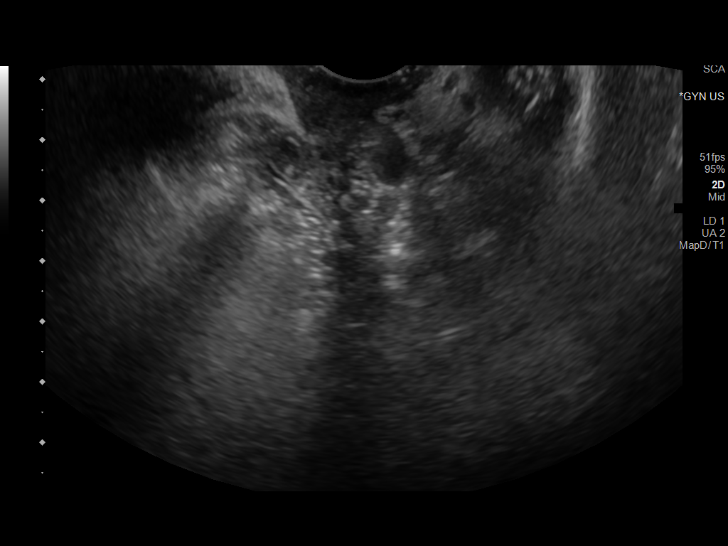
[im 10/23]
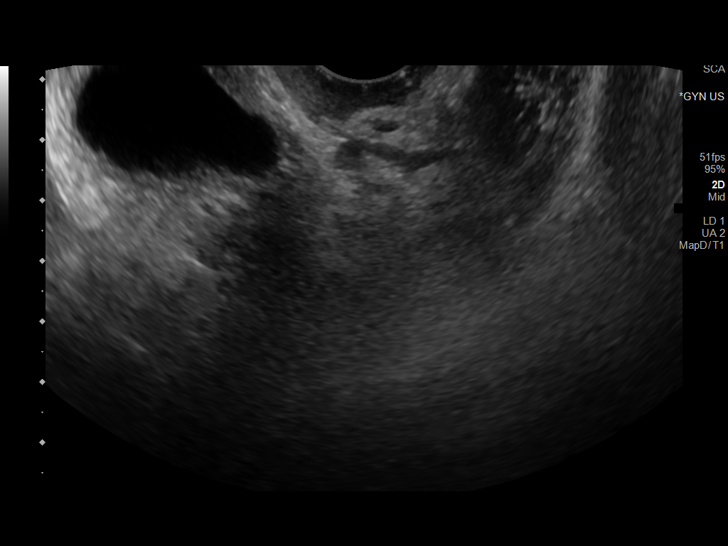
[im 12/23]
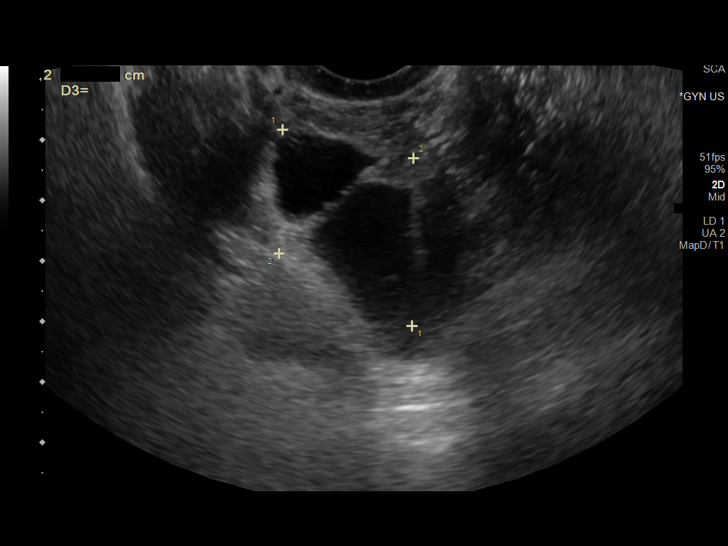
[im 14/23]
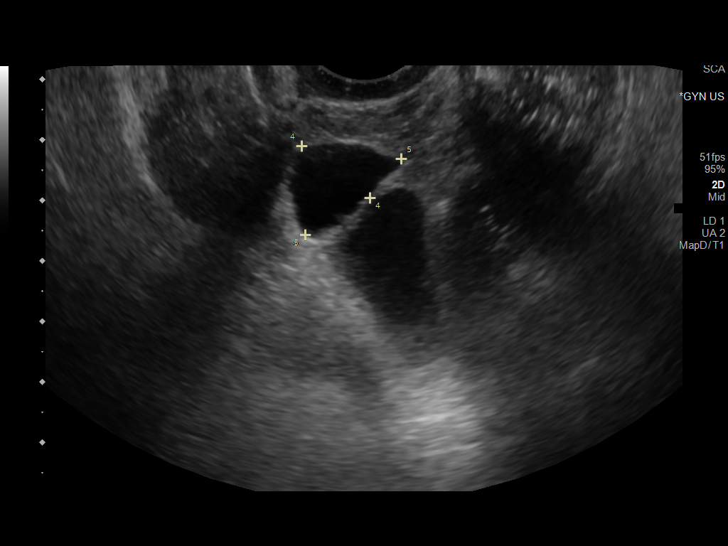
[im 16/23]
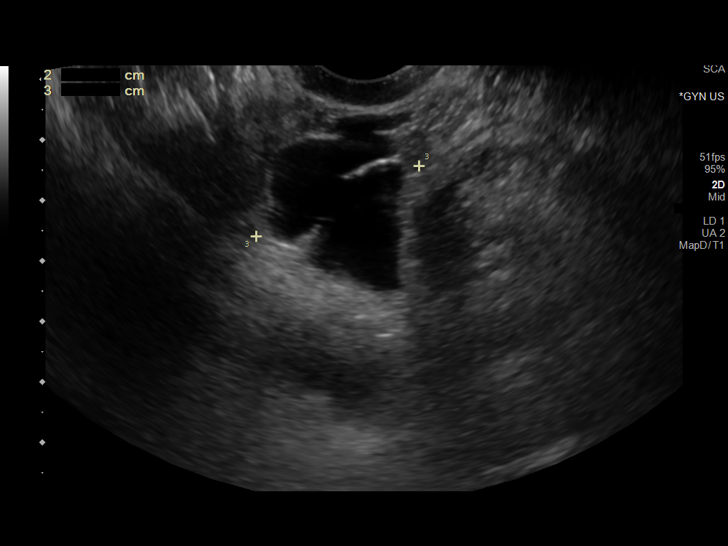
[im 17/23]
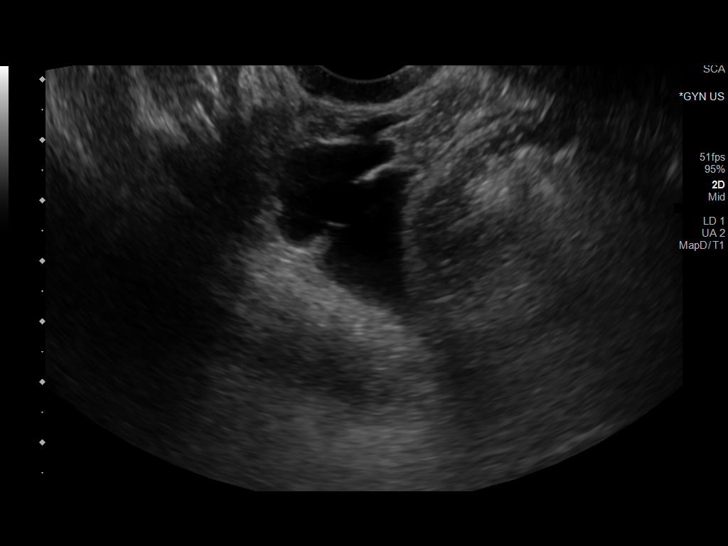
[im 19/23]
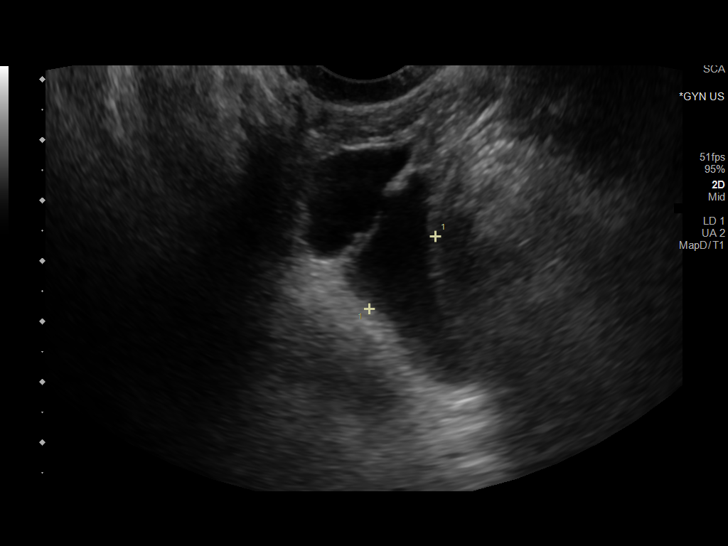
[im 21/23]
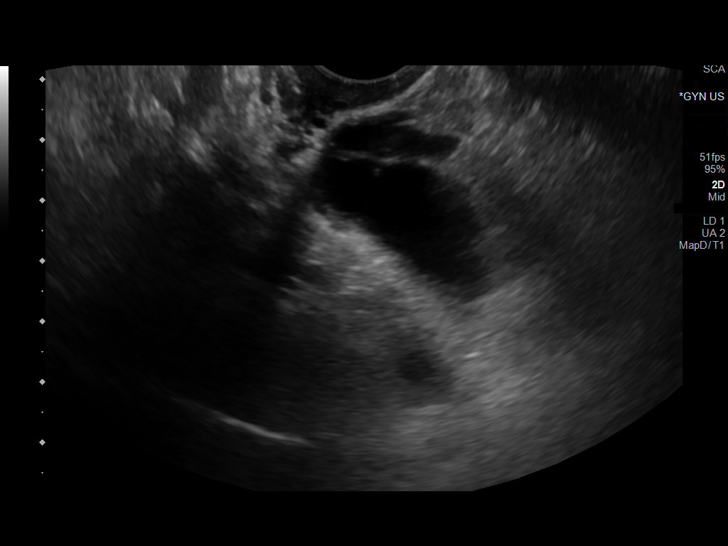
[im 23/23]
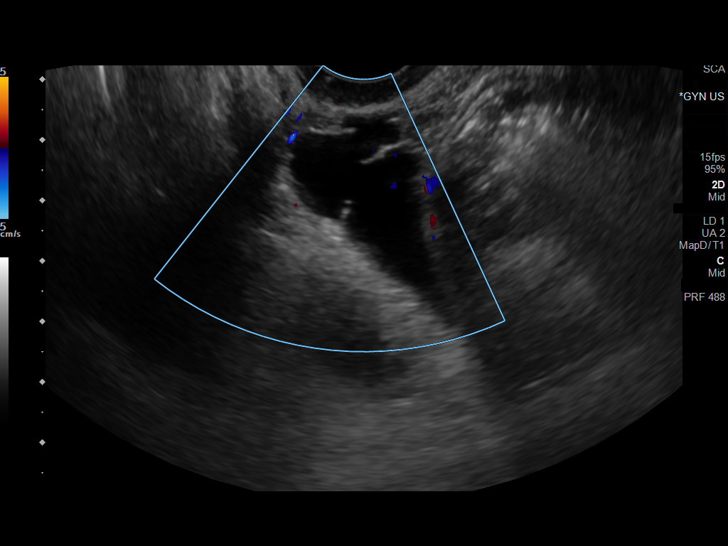

[13 of 23 positions shown; findings below may reference images not displayed]

FINDINGS: Uterus

Surgically absent. 5 mm echogenic focus at the vaginal cuff noted,
stable from previous, likely postsurgical in nature, possibly
reflecting a small granuloma versus calcification.

Endometrium

Surgically absent.

Right ovary

Measurements: 3.9 x 2.7 x 2.9 cm = volume: 16.2 mL. Previously seen
5 cm cyst has resolved. Now seen are two adjacent cystic structures
measuring 1.4 x 2.0 x 2.4 cm and 2.6 x 1.7 x 1.6 cm seen within the
right adnexa. These are irregular and somewhat elongated and tubular
in shape, and may be contiguous with 1 another. Possible internal
incomplete septations. Scattered areas of associated vascularity are
seen along the septations. No appreciable solid component.

Left ovary

Not visualized.  No adnexal mass.

Other findings:  No abnormal free fluid
IMPRESSION: 1. Interval resolution of previously seen 5 cm right ovarian cyst.
2. Interval development of irregular cystic structure measuring up
to 2.6 cm within the right adnexa as above. Finding is favored to
reflect a small hydrosalpinx. Possible complex and partially
septated cyst not entirely excluded. A short interval follow-up
ultrasound in 6-12 weeks could be performed for further evaluation
as clinically warranted.
3. Prior hysterectomy with nonvisualization of the left ovary.

## 2020-03-13 ENCOUNTER — Ambulatory Visit: Payer: BC Managed Care – PPO | Admitting: Obstetrics & Gynecology

## 2020-03-16 ENCOUNTER — Inpatient Hospital Stay (HOSPITAL_BASED_OUTPATIENT_CLINIC_OR_DEPARTMENT_OTHER): Admission: RE | Admit: 2020-03-16 | Payer: BC Managed Care – PPO | Source: Ambulatory Visit

## 2020-03-18 ENCOUNTER — Encounter: Payer: BC Managed Care – PPO | Admitting: Physical Therapy

## 2020-03-25 ENCOUNTER — Ambulatory Visit: Payer: BC Managed Care – PPO | Admitting: Physical Therapy

## 2020-04-23 ENCOUNTER — Other Ambulatory Visit: Payer: Self-pay | Admitting: Family Medicine

## 2020-04-23 DIAGNOSIS — E876 Hypokalemia: Secondary | ICD-10-CM

## 2020-05-06 ENCOUNTER — Telehealth (INDEPENDENT_AMBULATORY_CARE_PROVIDER_SITE_OTHER): Payer: BC Managed Care – PPO | Admitting: Family Medicine

## 2020-05-06 ENCOUNTER — Encounter: Payer: Self-pay | Admitting: Family Medicine

## 2020-05-06 ENCOUNTER — Other Ambulatory Visit: Payer: Self-pay

## 2020-05-06 VITALS — BP 127/85 | HR 83 | Ht 62.0 in

## 2020-05-06 DIAGNOSIS — I10 Essential (primary) hypertension: Secondary | ICD-10-CM

## 2020-05-06 DIAGNOSIS — J302 Other seasonal allergic rhinitis: Secondary | ICD-10-CM | POA: Diagnosis not present

## 2020-05-06 DIAGNOSIS — L700 Acne vulgaris: Secondary | ICD-10-CM

## 2020-05-06 MED ORDER — FLUTICASONE PROPIONATE 50 MCG/ACT NA SUSP: NASAL | 5 refills | Status: DC

## 2020-05-06 MED ORDER — CLINDAMYCIN PHOS-BENZOYL PEROX 1-5 % EX GEL: Freq: Two times a day (BID) | CUTANEOUS | 0 refills | Status: AC

## 2020-05-06 NOTE — Assessment & Plan Note (Signed)
Well controlled, no changes to meds. Encouraged heart healthy diet such as the DASH diet and exercise as tolerated.  °

## 2020-05-06 NOTE — Progress Notes (Signed)
Virtual Visit via Video Note  I connected with Dana Patterson on 05/06/20 at  8:00 AM EDT by a video enabled telemedicine application and verified that I am speaking with the correct person using two identifiers.  Location: Patient: home alone  Provider: home    I discussed the limitations of evaluation and management by telemedicine and the availability of in person appointments. The patient expressed understanding and agreed to proceed.  History of Present Illness: Pt is home f/u on bp and c/o breakout on face around where mask will sit.   She uses cetaphil to clean her sking No other complaints    Observations/Objective: Vitals:   05/06/20 0807  BP: 127/85  Pulse: 83  pt is in nad No sob Hyperpigmentations skin -- chin and cheeks with small papules c/w acne    Assessment and Plan: 1. Seasonal allergies Controlled-- con't claritin and flonase- fluticasone (FLONASE) 50 MCG/ACT nasal spray; SPRAY 2 SPRAYS INTO EACH NOSTRIL EVERY DAY  Dispense: 48 mL; Refill: 5  2. Acne vulgaris con't cetaphil Add benzaclin Consider adding retin A or derm referal----  F/u 2-3 months  - clindamycin-benzoyl peroxide (BENZACLIN) gel; Apply topically 2 (two) times daily.  Dispense: 25 g; Refill: 0  3. Essential hypertension Well controlled, no changes to meds. Encouraged heart healthy diet such as the DASH diet and exercise as tolerated.    Follow Up Instructions:    I discussed the assessment and treatment plan with the patient. The patient was provided an opportunity to ask questions and all were answered. The patient agreed with the plan and demonstrated an understanding of the instructions.   The patient was advised to call back or seek an in-person evaluation if the symptoms worsen or if the condition fails to improve as anticipated.  I provided 25 minutes of non-face-to-face time during this encounter.   Donato Schultz, DO

## 2020-05-27 ENCOUNTER — Other Ambulatory Visit: Payer: BC Managed Care – PPO

## 2020-06-04 ENCOUNTER — Telehealth: Payer: Self-pay | Admitting: Family Medicine

## 2020-06-04 ENCOUNTER — Other Ambulatory Visit (INDEPENDENT_AMBULATORY_CARE_PROVIDER_SITE_OTHER): Payer: Self-pay

## 2020-06-04 ENCOUNTER — Ambulatory Visit (INDEPENDENT_AMBULATORY_CARE_PROVIDER_SITE_OTHER): Payer: BC Managed Care – PPO | Admitting: Medical

## 2020-06-04 ENCOUNTER — Other Ambulatory Visit: Payer: Self-pay

## 2020-06-04 VITALS — BP 132/80 | HR 93 | Resp 16 | Ht 62.0 in | Wt 239.0 lb

## 2020-06-04 DIAGNOSIS — J3489 Other specified disorders of nose and nasal sinuses: Secondary | ICD-10-CM

## 2020-06-04 DIAGNOSIS — H6090 Unspecified otitis externa, unspecified ear: Secondary | ICD-10-CM

## 2020-06-04 DIAGNOSIS — E785 Hyperlipidemia, unspecified: Secondary | ICD-10-CM

## 2020-06-04 DIAGNOSIS — J029 Acute pharyngitis, unspecified: Secondary | ICD-10-CM

## 2020-06-04 DIAGNOSIS — H9209 Otalgia, unspecified ear: Secondary | ICD-10-CM

## 2020-06-04 LAB — COMPREHENSIVE METABOLIC PANEL
ALT: 27 U/L (ref 0–35)
AST: 19 U/L (ref 0–37)
Albumin: 4.7 g/dL (ref 3.5–5.2)
Alkaline Phosphatase: 87 U/L (ref 39–117)
BUN: 8 mg/dL (ref 6–23)
CO2: 30 mEq/L (ref 19–32)
Calcium: 10 mg/dL (ref 8.4–10.5)
Chloride: 98 mEq/L (ref 96–112)
Creatinine, Ser: 0.73 mg/dL (ref 0.40–1.20)
GFR: 103.64 mL/min (ref >60.00)
Glucose, Bld: 95 mg/dL (ref 70–99)
Potassium: 4 mEq/L (ref 3.5–5.1)
Sodium: 138 mEq/L (ref 135–145)
Total Bilirubin: 0.5 mg/dL (ref 0.2–1.2)
Total Protein: 7.5 g/dL (ref 6.0–8.3)

## 2020-06-04 LAB — LIPID PANEL
Cholesterol: 257 mg/dL — ABNORMAL HIGH (ref 0–200)
HDL: 59.8 mg/dL (ref >39.00)
LDL Cholesterol: 158 mg/dL — ABNORMAL HIGH (ref 0–99)
NonHDL: 197.56
Total CHOL/HDL Ratio: 4
Triglycerides: 199 mg/dL — ABNORMAL HIGH (ref 0.0–149.0)
VLDL: 39.8 mg/dL (ref 0.0–40.0)

## 2020-06-04 MED ORDER — NEOMYCIN-POLYMYXIN-HC 3.5-10000-1 OT SOLN: 3.0000 [drp] | Freq: Four times a day (QID) | OTIC | 0 refills | Status: AC

## 2020-06-04 MED ORDER — NEOMYCIN-POLYMYXIN-HC 3.5-10000-1 OT SOLN: 3.0000 [drp] | Freq: Four times a day (QID) | OTIC | 0 refills | Status: DC

## 2020-06-04 MED ORDER — AMOXICILLIN-POT CLAVULANATE 875-125 MG PO TABS: 1.0000 | ORAL_TABLET | Freq: Two times a day (BID) | ORAL | 0 refills | Status: AC

## 2020-06-04 MED FILL — NEO/POLYMYXIN/HC EAR SOLN: 3.5-10000-1 | 17 days supply | Qty: 10 | Fill #0

## 2020-06-04 NOTE — Telephone Encounter (Signed)
Will you call down stairs and see how expensive it is. I just sent rx there. Or maybe call [facility]. Best drop and cheap typically?

## 2020-06-04 NOTE — Telephone Encounter (Signed)
Patient is calling to check the status of ear drop.

## 2020-06-04 NOTE — Progress Notes (Signed)
Subjective:    Patient ID: Dana Patterson, female    DOB: 28-Feb-1976, 44 y.o.   MRN: [id number]  HPI  Pt in with ear pain for 2 days.  It started day before yesterday. Pt thinks maybe mild maxillary pressure. No fever, no chills or sweats. Hard to sleep last night due to pain.   Pt has not been swimming.  St for one day as well. Left side submandibular region pain described.   Review of Systems  Constitutional: Negative for chills, fatigue and fever.  HENT: Positive for ear pain and sore throat. Negative for congestion and trouble swallowing.        St for one day.  Respiratory: Negative for cough, chest tightness, shortness of breath and wheezing.   Cardiovascular: Negative for chest pain and palpitations.  Gastrointestinal: Negative for abdominal pain.  Genitourinary: Negative for dysuria, flank pain, frequency and urgency.  Musculoskeletal: Negative for back pain.  Neurological: Negative for dizziness, numbness and headaches.  Hematological: Positive for adenopathy. Does not bruise/bleed easily.  Psychiatric/Behavioral: Negative for behavioral problems and decreased concentration.    Past Medical History:  Diagnosis Date  . Allergy   . HTN (hypertension) 10/29/2015  . Hyperlipidemia   . Vaginal Pap smear, abnormal      Social History   Socioeconomic History  . Marital status: Married    Spouse name: Not on file  . Number of children: Not on file  . Years of education: Not on file  . Highest education level: Not on file  Occupational History  . Not on file  Tobacco Use  . Smoking status: Never Smoker  . Smokeless tobacco: Never Used  Substance and Sexual Activity  . Alcohol use: No    Alcohol/week: 0.0 standard drinks  . Drug use: No  . Sexual activity: Not on file  Other Topics Concern  . Not on file  Social History Narrative  . Not on file   Social Determinants of Health   Financial Resource Strain:   . Difficulty of Paying Living Expenses:   Food  Insecurity:   . Worried About Programme researcher, broadcasting/film/video in the Last Year:   . Barista in the Last Year:   Transportation Needs:   . Freight forwarder (Medical):   Marland Kitchen Lack of Transportation (Non-Medical):   Physical Activity:   . Days of Exercise per Week:   . Minutes of Exercise per Session:   Stress:   . Feeling of Stress :   Social Connections:   . Frequency of Communication with Friends and Family:   . Frequency of Social Gatherings with Friends and Family:   . Attends Religious Services:   . Active Member of Clubs or Organizations:   . Attends Banker Meetings:   Marland Kitchen Marital Status:   Intimate Partner Violence:   . Fear of Current or Ex-Partner:   . Emotionally Abused:   Marland Kitchen Physically Abused:   . Sexually Abused:     Past Surgical History:  Procedure Laterality Date  . ABDOMINAL HYSTERECTOMY     Fibroids per patient  . TUBAL LIGATION      Family History  Problem Relation Age of Onset  . Heart disease Mother   . Kidney disease Mother   . Hypertension Mother   . Hypertension Father     No Known Allergies  Current Outpatient Medications on File Prior to Visit  Medication Sig Dispense Refill  . clindamycin-benzoyl peroxide (BENZACLIN) gel Apply topically 2 (two)  times daily. (Patient not taking: Reported on 06/04/2020) 25 g 0  . fluticasone (FLONASE) 50 MCG/ACT nasal spray SPRAY 2 SPRAYS INTO EACH NOSTRIL EVERY DAY 48 mL 5  . hydrochlorothiazide (HYDRODIURIL) 25 MG tablet TAKE 1 TABLET BY MOUTH EVERY DAY 30 tablet 5  . KLOR-CON M20 20 MEQ tablet TAKE 2 TABLETS BY MOUTH DAILY 60 tablet 2  . loratadine (CLARITIN) 10 MG tablet TAKE 1 TABLET BY MOUTH EVERY DAY 30 tablet 4   No current facility-administered medications on file prior to visit.    BP 132/80 (BP Location: Right Arm, Patient Position: Sitting, Cuff Size: Large)   Pulse 93   Resp 16   Ht 5\' 2"  (1.575 m)   Wt 239 lb (108.4 kg)   SpO2 98%   BMI 43.71 kg/m       Objective:   Physical  Exam  General Mental Status- Alert. General Appearance- Not in acute distress.   Skin General: Color- Normal Color. Moisture- Normal Moisture.  Neck Carotid Arteries- Normal color. Moisture- Normal Moisture. No carotid bruits. No JVD.  Chest and Lung Exam Auscultation: Breath Sounds:-Normal.  Cardiovascular Auscultation:Rythm- Regular. Murmurs & Other Heart Sounds:Auscultation of the heart reveals- No Murmurs.  Abdomen Inspection:-Inspeection Normal. Palpation/Percussion:Note:No mass. Palpation and Percussion of the abdomen reveal- Non Tender, Non Distended + BS, no rebound or guarding.    Neurologic Cranial Nerve exam:- CN III-XII intact(No nystagmus), symmetric smile. Strength:- 5/5 equal and symmetric strength both upper and lower extremities.  heent- left ear canal swollen. Tragal tenderness. Can't see tm. Throat- mild swollen and red rt tonsil.  Slight tender but not swollen left submandibular node.     Assessment & Plan:  You do appear to have otitis externa/swimmer's ear on exam.  Cannot see eardrums of potentially could have infection as tympanic membrane as well.  Some pharyngitis on exam and you do report some maxillary sinus pressure as well.  Did offer Rocephin IM injection today but this was declined.  Prescribed Augmentin antibiotic and Cortisporin otic drops.  Expected your signs and symptoms will improve but if symptoms persist then would recommend antibiotic injection.  If signs symptoms worsen over the weekend then recommend ED evaluation.  Follow-up on Monday or as needed  Time spent with patient today was 25  minutes which consisted of chart review, discussing diagnosis, work up treatment and documentation.

## 2020-06-04 NOTE — Patient Instructions (Signed)
You do appear to have otitis externa/swimmer's ear on exam.  Cannot see eardrums of potentially could have infection as tympanic membrane as well.  Some pharyngitis on exam and you do report some maxillary sinus pressure as well.  Did offer Rocephin IM injection today but this was declined.  Prescribed Augmentin antibiotic and Cortisporin otic drops.  Expected your signs and symptoms will improve but if symptoms persist then would recommend antibiotic injection.  If signs symptoms worsen over the weekend then recommend ED evaluation.  Follow-up on Monday or as needed

## 2020-06-04 NOTE — Telephone Encounter (Signed)
Patient states that ear drops sent to pharmacy are to much. Please in another prescription.   neomycin-polymyxin-hydrocortisone (CORTISPORIN) OTIC solution [[id number]]   Please advise

## 2020-06-04 NOTE — Telephone Encounter (Signed)
Spoke with pharmacy downstairs states they dont have insurance on file for the patient , so with no insurance they dont have good alternatives   Neopoly eye drops  generic maxitrol for eye drops in ears less than 30 bucks ...  called patient after speaking with the pharmacy and she stated she paid for the ear drops out of pocket

## 2020-06-04 NOTE — Telephone Encounter (Signed)
Ok. Usually those drops are reasonable? Glad she got the rx.

## 2020-06-04 NOTE — Telephone Encounter (Signed)
Per patient ear drops sent in are $ 84.00

## 2020-06-20 ENCOUNTER — Other Ambulatory Visit: Payer: Self-pay | Admitting: Family Medicine

## 2020-06-20 DIAGNOSIS — I10 Essential (primary) hypertension: Secondary | ICD-10-CM

## 2020-06-30 ENCOUNTER — Telehealth: Payer: Self-pay

## 2020-06-30 NOTE — Telephone Encounter (Signed)
Attempted to contact patient regarding mammography scholarship application. Left message on voicemail requesting return call.

## 2020-08-10 ENCOUNTER — Other Ambulatory Visit: Payer: Self-pay | Admitting: Family Medicine

## 2020-08-10 DIAGNOSIS — Z889 Allergy status to unspecified drugs, medicaments and biological substances status: Secondary | ICD-10-CM

## 2020-12-30 ENCOUNTER — Other Ambulatory Visit: Payer: Self-pay | Admitting: Family Medicine

## 2020-12-30 DIAGNOSIS — I10 Essential (primary) hypertension: Secondary | ICD-10-CM

## 2021-02-04 ENCOUNTER — Other Ambulatory Visit: Payer: Self-pay | Admitting: Family Medicine

## 2021-02-04 DIAGNOSIS — Z889 Allergy status to unspecified drugs, medicaments and biological substances status: Secondary | ICD-10-CM

## 2021-02-09 ENCOUNTER — Other Ambulatory Visit: Payer: Self-pay | Admitting: Family Medicine

## 2021-02-09 DIAGNOSIS — E876 Hypokalemia: Secondary | ICD-10-CM

## 2021-02-18 ENCOUNTER — Other Ambulatory Visit: Payer: Self-pay | Admitting: Family Medicine

## 2021-02-18 DIAGNOSIS — Z889 Allergy status to unspecified drugs, medicaments and biological substances status: Secondary | ICD-10-CM

## 2021-03-05 ENCOUNTER — Other Ambulatory Visit: Payer: Self-pay | Admitting: Family Medicine

## 2021-03-05 DIAGNOSIS — I10 Essential (primary) hypertension: Secondary | ICD-10-CM

## 2021-03-16 ENCOUNTER — Other Ambulatory Visit: Payer: Self-pay | Admitting: Family Medicine

## 2021-03-16 DIAGNOSIS — I10 Essential (primary) hypertension: Secondary | ICD-10-CM

## 2021-04-11 ENCOUNTER — Other Ambulatory Visit: Payer: Self-pay | Admitting: Family Medicine

## 2021-04-11 DIAGNOSIS — I10 Essential (primary) hypertension: Secondary | ICD-10-CM

## 2021-04-11 DIAGNOSIS — Z889 Allergy status to unspecified drugs, medicaments and biological substances status: Secondary | ICD-10-CM

## 2021-05-15 ENCOUNTER — Other Ambulatory Visit: Payer: Self-pay | Admitting: Family Medicine

## 2021-05-15 DIAGNOSIS — Z889 Allergy status to unspecified drugs, medicaments and biological substances status: Secondary | ICD-10-CM

## 2021-05-15 DIAGNOSIS — I10 Essential (primary) hypertension: Secondary | ICD-10-CM

## 2021-05-21 ENCOUNTER — Other Ambulatory Visit: Payer: Self-pay | Admitting: Family Medicine

## 2021-05-21 DIAGNOSIS — J302 Other seasonal allergic rhinitis: Secondary | ICD-10-CM

## 2021-06-20 ENCOUNTER — Other Ambulatory Visit: Payer: Self-pay | Admitting: Family Medicine

## 2021-06-20 DIAGNOSIS — I10 Essential (primary) hypertension: Secondary | ICD-10-CM

## 2021-06-20 DIAGNOSIS — J302 Other seasonal allergic rhinitis: Secondary | ICD-10-CM

## 2021-07-20 ENCOUNTER — Other Ambulatory Visit: Payer: Self-pay | Admitting: Family Medicine

## 2021-07-20 DIAGNOSIS — Z889 Allergy status to unspecified drugs, medicaments and biological substances status: Secondary | ICD-10-CM

## 2021-07-20 DIAGNOSIS — J302 Other seasonal allergic rhinitis: Secondary | ICD-10-CM

## 2021-08-03 ENCOUNTER — Other Ambulatory Visit: Payer: Self-pay | Admitting: Family Medicine

## 2021-08-03 DIAGNOSIS — J302 Other seasonal allergic rhinitis: Secondary | ICD-10-CM

## 2021-08-03 DIAGNOSIS — Z889 Allergy status to unspecified drugs, medicaments and biological substances status: Secondary | ICD-10-CM
